# Patient Record
Sex: Male | Born: 1965 | Race: Black or African American | Hispanic: No | Marital: Married | Smoking: Never smoker
Health system: Southern US, Community
[De-identification: ages and names within clinical notes are randomized; demographics above are authoritative.]

## PROBLEM LIST (undated history)

## (undated) DIAGNOSIS — F419 Anxiety disorder, unspecified: Secondary | ICD-10-CM

## (undated) DIAGNOSIS — E785 Hyperlipidemia, unspecified: Secondary | ICD-10-CM

## (undated) DIAGNOSIS — I10 Essential (primary) hypertension: Secondary | ICD-10-CM

## (undated) DIAGNOSIS — T4145XA Adverse effect of unspecified anesthetic, initial encounter: Secondary | ICD-10-CM

## (undated) DIAGNOSIS — IMO0001 Reserved for inherently not codable concepts without codable children: Secondary | ICD-10-CM

## (undated) DIAGNOSIS — Z531 Procedure and treatment not carried out because of patient's decision for reasons of belief and group pressure: Secondary | ICD-10-CM

## (undated) DIAGNOSIS — M109 Gout, unspecified: Secondary | ICD-10-CM

## (undated) DIAGNOSIS — J45909 Unspecified asthma, uncomplicated: Secondary | ICD-10-CM

## (undated) DIAGNOSIS — M1990 Unspecified osteoarthritis, unspecified site: Secondary | ICD-10-CM

## (undated) DIAGNOSIS — T8859XA Other complications of anesthesia, initial encounter: Secondary | ICD-10-CM

## (undated) HISTORY — PX: COLONOSCOPY W/ POLYPECTOMY: SHX1380

## (undated) HISTORY — DX: Hyperlipidemia, unspecified: E78.5

## (undated) HISTORY — DX: Unspecified asthma, uncomplicated: J45.909

## (undated) HISTORY — DX: Essential (primary) hypertension: I10

## (undated) HISTORY — DX: Gout, unspecified: M10.9

---
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
## 1993-10-29 HISTORY — PX: INGUINAL HERNIA REPAIR: SUR1180

## 1998-06-21 ENCOUNTER — Emergency Department (HOSPITAL_COMMUNITY): Admission: EM | Admit: 1998-06-21 | Discharge: 1998-06-21 | Payer: Self-pay | Admitting: *Deleted

## 1998-07-21 ENCOUNTER — Emergency Department (HOSPITAL_COMMUNITY): Admission: EM | Admit: 1998-07-21 | Discharge: 1998-07-21 | Payer: Self-pay | Admitting: Emergency Medicine

## 1998-07-22 ENCOUNTER — Encounter: Payer: Self-pay | Admitting: Emergency Medicine

## 2000-09-23 ENCOUNTER — Encounter: Payer: Self-pay | Admitting: Emergency Medicine

## 2000-09-23 ENCOUNTER — Emergency Department (HOSPITAL_COMMUNITY): Admission: EM | Admit: 2000-09-23 | Discharge: 2000-09-23 | Payer: Self-pay | Admitting: Emergency Medicine

## 2007-06-02 ENCOUNTER — Emergency Department (HOSPITAL_COMMUNITY): Admission: EM | Admit: 2007-06-02 | Discharge: 2007-06-02 | Payer: Self-pay | Admitting: Emergency Medicine

## 2013-10-20 ENCOUNTER — Ambulatory Visit (INDEPENDENT_AMBULATORY_CARE_PROVIDER_SITE_OTHER): Payer: BC Managed Care – PPO | Admitting: Physician Assistant

## 2013-10-20 VITALS — BP 130/80 | HR 88 | Temp 98.5°F | Resp 16 | Ht >= 80 in | Wt 271.0 lb

## 2013-10-20 DIAGNOSIS — R51 Headache: Secondary | ICD-10-CM

## 2013-10-20 DIAGNOSIS — R509 Fever, unspecified: Secondary | ICD-10-CM

## 2013-10-20 DIAGNOSIS — F40298 Other specified phobia: Secondary | ICD-10-CM

## 2013-10-20 DIAGNOSIS — G039 Meningitis, unspecified: Secondary | ICD-10-CM

## 2013-10-20 DIAGNOSIS — H53149 Visual discomfort, unspecified: Secondary | ICD-10-CM

## 2013-10-20 LAB — POCT CBC
Granulocyte percent: 61.5 %G (ref 37–80)
HCT, POC: 48.2 % (ref 43.5–53.7)
Hemoglobin: 15.1 g/dL (ref 14.1–18.1)
Lymph, poc: 2.3 (ref 0.6–3.4)
MCH, POC: 28.5 pg (ref 27–31.2)
MCHC: 31.3 g/dL — AB (ref 31.8–35.4)
MCV: 91 fL (ref 80–97)
MID (cbc): 0.7 (ref 0–0.9)
MPV: 10.9 fL (ref 0–99.8)
POC Granulocyte: 4.9 (ref 2–6.9)
POC LYMPH PERCENT: 29.1 %L (ref 10–50)
POC MID %: 9.4 %M (ref 0–12)
Platelet Count, POC: 201 10*3/uL (ref 142–424)
RBC: 5.3 M/uL (ref 4.69–6.13)
RDW, POC: 14.5 %
WBC: 7.9 10*3/uL (ref 4.6–10.2)

## 2013-10-20 LAB — POCT INFLUENZA A/B
Influenza A, POC: NEGATIVE
Influenza B, POC: NEGATIVE

## 2013-10-20 NOTE — Patient Instructions (Signed)
I have advised him that he may have meningitis and that he must undergo further testing and evaluation at the emergency room. He has been advised to go to the [facility] emergency room. He understands the implications of this possible diagnosis. He agrees to go to the ER for further evaluation tonight without stopping elsewhere.   Viral Meningitis Meningitis is an inflammation of the fluid and membranes surrounding the spinal cord and brain. Viral meningitis is caused by a viral infection. It is important to know whether a virus or bacterium is causing your meningitis. Viral meningitis is generally less severe than bacterial meningitis. Aseptic meningitis is any meningitis not caused by a bacterial infection, including viral meningitis. Meningitis can also be caused by fungi, parasites, certain medicines, cancer, and autoimmune disorders. Uncomplicated meningitis usually resolves on its own in 7 to 10 days.However, there can be complicated cases that last much longer. People with lowered immune systems are at greater risk for poor outcomes from meningitis.It is important to get treatment as soon as possible to minimize the impact of a meningitis infection. Long-term complications could include seizures, hearing loss, weakness, paralysis, blindness, or cognitive impairment. CAUSES Most cases of meningitis are due to viruses. Viruses that cause meningitis include enteroviruses (such as polio and coxsackie viruses), herpes simplex virus, varicella zoster virus, mumps, and HIV. SYMPTOMS  Symptoms can develop over many hours. They may even take a few days to develop. Common symptoms of meningitis in people over the age of 2 years include:  High fever.  Headache.  Stiff neck.  Irritability.  Nausea and vomiting.  Fatigue.  Discomfort from exposure to light.  Discomfort from exposure to loud noise.  Trouble walking.  Altered mental status.  Seizures. DIAGNOSIS  Early diagnosis and  treatment are very important. If you have symptoms, you should see your caregiver right away. Your evaluation may include lab tests of your blood and spinal fluid. A spinal fluid sample is taken through a procedure called a lumbar puncture or spinal tap. During the procedure, a needle is inserted into an area in the lower back. You may also have a CT scan of your brain as part of your evaluation.If there is suspicion of an infection or inflammation of the brain (encephalitis), an MRI scan may be done. TREATMENT   Antibiotics are not effective against a virus. Antiviral medicines may be used depending on the cause of your meningitis.  Treatment for viral meningitis focuses on reducing your symptoms. This may include rest, hydration, and medicines to reduce fever and pain.  Steroids may also be used if there is significant swelling of the brain. PREVENTION  Vaccines can help prevent meningitis caused by polio, measles, and mumps.  Strict hand washing is effective in controlling the spread of many causes of meningitis.  Using barrier protection during sexual intercourse can prevent the spread of meningitis caused by viruses such as the herpes virus or HIV.  Protection from mosquitoes, especially for the very young and very old, can prevent some causes of meningitis. HOME CARE INSTRUCTIONS  Rest.  Drink enough water and fluids to keep your urine clear or pale yellow.  Take all medicine as prescribed.  Practice good hygiene to prevent others from getting sick.  Follow up with your caregiver as directed. SEEK IMMEDIATE MEDICAL CARE IF:  You develop dizziness.  You have a very fast heartbeat.  You have difficulty breathing.  You develop confusion.  You have seizures.  You have unstoppable nausea and vomiting.  You develop any worsening symptoms. MAKE SURE YOU:  Understand these instructions.  Will watch your condition.  Will get help right away if you are not doing well or  get worse. Document Released: 10/05/2002 Document Revised: 01/07/2012 Document Reviewed: 01/30/2011 [facility] Patient Information 2014 [place], [place].

## 2013-10-20 NOTE — Progress Notes (Signed)
Subjective:    Patient ID: Darrell King, male    DOB: 1966/08/13, 47 y.o.   MRN: [id number]  HPI 47 year old male with history of asthma presents with a 2 day history of "I am just sick." He complains of headache, fever, chills, fatigue, and myalgias. His headache is located along the occipital and bilateral temples. He complains of photophobia and phonophobia. There is some associated neck stiffness as he is unable to touch his chin to his chest. He has had a decreased appetite, but has been pushing fluids. He notes an increase in his urine output secondary to this, he otherwise denies polyuria. He denies any nasal congestion, rhinorrhea, sore throat, sinus pressure, otalgia, nausea, or vomiting. He denies knowing any sick contacts.   He also mentions noticing a bump along his left groin just prior to the above onset of symptoms. The bump is not sore itself, however he sore just distally.    Review of Systems  Constitutional: Positive for fever, chills, appetite change and fatigue.  HENT: Positive for postnasal drip. Negative for congestion, ear pain, hearing loss, rhinorrhea, sinus pressure, sneezing, sore throat, tinnitus and voice change.   Respiratory: Positive for cough. Negative for shortness of breath and wheezing.   Gastrointestinal: Negative for nausea, vomiting and diarrhea.  Endocrine: Positive for polyuria.  Skin: Positive for rash.  Neurological: Positive for headaches.    Past Medical History  Diagnosis Date  . Asthma    No Known Allergies  Prior to Admission medications   Not on File   History   Social History  . Marital Status: Married    Spouse Name: N/A    Number of Children: N/A  . Years of Education: N/A   Occupational History  . Not on file.   Social History Main Topics  . Smoking status: Never Smoker   . Smokeless tobacco: Not on file  . Alcohol Use: 0.5 oz/week    1 drink(s) per week  . Drug Use: No  . Sexual Activity: Not on file   Other  Topics Concern  . Not on file   Social History Narrative  . No narrative on file        Objective:   Physical Exam  Physical Exam: Filed Vitals:   10/20/13 1652  BP: 130/80  Pulse: 88  Temp: 98.5 F (36.9 C)  TempSrc: Oral  Resp: 16  Height: 6\' 9"  (2.057 m)  Weight: 271 lb (122.925 kg)  SpO2: 99%   Body mass index is 29.05 kg/(m^2).  General: Well developed, well nourished, in no acute distress. Head: Normocephalic, atraumatic, eyes without discharge, sclera non-icteric, nares are congested. Bilateral auditory canals clear, TM's are without perforation, pearly grey and translucent with reflective cone of light bilaterally. Oral cavity moist, posterior pharynx without exudate, erythema, peritonsillar abscess, or post nasal drip.  Neck: Supple. No thyromegaly. No lymphadenopathy. Positive nuchal rigidity. Lungs: Clear bilaterally to auscultation without wheezes, rales, or rhonchi. Breathing is unlabored. Heart: RRR with S1 S2. No murmurs, rubs, or gallops appreciated.  Msk:  Strength and tone normal for age. Extremities/Skin: Warm and dry. No clubbing or cyanosis. No edema. Follicular lesion left groin with inferior adenopathy. Neuro: Alert and oriented X 3. Moves all extremities spontaneously. Gait is normal. CNII-XII grossly in tact. Positive Kernig's and Brudzinski's signs.    Psych:  Responds to questions appropriately with a normal affect.   Labs: Results for orders placed in visit on 10/20/13  POCT CBC  Result Value Range   WBC 7.9  4.6 - 10.2 K/uL   Lymph, poc 2.3  0.6 - 3.4   POC LYMPH PERCENT 29.1  10 - 50 %L   MID (cbc) 0.7  0 - 0.9   POC MID % 9.4  0 - 12 %M   POC Granulocyte 4.9  2 - 6.9   Granulocyte percent 61.5  37 - 80 %G   RBC 5.30  4.69 - 6.13 M/uL   Hemoglobin 15.1  14.1 - 18.1 g/dL   HCT, POC 40.9  81.1 - 53.7 %   MCV 91.0  80 - 97 fL   MCH, POC 28.5  27 - 31.2 pg   MCHC 31.3 (*) 31.8 - 35.4 g/dL   RDW, POC 91.4     Platelet Count, POC 201   142 - 424 K/uL   MPV 10.9  0 - 99.8 fL  POCT INFLUENZA A/B      Result Value Range   Influenza A, POC Negative     Influenza B, POC Negative          Assessment & Plan:  47 year old male with headache, fever, chills, photophobia, phonophobia, and possible meningitis  -I am concerned that he has meningitis.  -I have advised him that he needs further evaluation at [facility] ED tonight including a lumbar puncture.  -He understands this and agrees to go to the [facility] ED straight from here for further evaluation  -Spoke with charge nurse at [facility] for patient hand off -Patient discussed with Dr. Clyda Hurdle, PA-C [facility] [address] [place], [place] [address] [phone]

## 2013-10-21 ENCOUNTER — Inpatient Hospital Stay (HOSPITAL_COMMUNITY)
Admission: EM | Admit: 2013-10-21 | Discharge: 2013-10-23 | DRG: 076 | Disposition: A | Discharge status: Home / Self Care | Payer: BC Managed Care – PPO | Attending: Internal Medicine | Admitting: Internal Medicine

## 2013-10-21 ENCOUNTER — Emergency Department (HOSPITAL_COMMUNITY): Payer: BC Managed Care – PPO

## 2013-10-21 ENCOUNTER — Other Ambulatory Visit: Payer: Self-pay | Admitting: Diagnostic Radiology

## 2013-10-21 ENCOUNTER — Encounter (HOSPITAL_COMMUNITY): Payer: Self-pay | Admitting: Emergency Medicine

## 2013-10-21 DIAGNOSIS — G039 Meningitis, unspecified: Secondary | ICD-10-CM

## 2013-10-21 DIAGNOSIS — J45909 Unspecified asthma, uncomplicated: Secondary | ICD-10-CM | POA: Diagnosis present

## 2013-10-21 DIAGNOSIS — J111 Influenza due to unidentified influenza virus with other respiratory manifestations: Secondary | ICD-10-CM

## 2013-10-21 DIAGNOSIS — R599 Enlarged lymph nodes, unspecified: Secondary | ICD-10-CM | POA: Diagnosis present

## 2013-10-21 DIAGNOSIS — R590 Localized enlarged lymph nodes: Secondary | ICD-10-CM

## 2013-10-21 DIAGNOSIS — G030 Nonpyogenic meningitis: Secondary | ICD-10-CM

## 2013-10-21 DIAGNOSIS — R509 Fever, unspecified: Secondary | ICD-10-CM

## 2013-10-21 DIAGNOSIS — A879 Viral meningitis, unspecified: Principal | ICD-10-CM | POA: Diagnosis present

## 2013-10-21 HISTORY — DX: Procedure and treatment not carried out because of patient's decision for reasons of belief and group pressure: Z53.1

## 2013-10-21 HISTORY — DX: Anxiety disorder, unspecified: F41.9

## 2013-10-21 HISTORY — DX: Reserved for inherently not codable concepts without codable children: IMO0001

## 2013-10-21 LAB — CREATININE, SERUM
Creatinine, Ser: 1.19 mg/dL (ref 0.50–1.35)
GFR calc Af Amer: 83 mL/min — ABNORMAL LOW (ref >90)

## 2013-10-21 LAB — CBC WITH DIFFERENTIAL/PLATELET
Basophils Absolute: 0 10*3/uL (ref 0.0–0.1)
Basophils Relative: 0 % (ref 0–1)
Eosinophils Relative: 3 % (ref 0–5)
HCT: 41.2 % (ref 39.0–52.0)
Hemoglobin: 14.3 g/dL (ref 13.0–17.0)
Lymphocytes Relative: 17 % (ref 12–46)
MCHC: 34.7 g/dL (ref 30.0–36.0)
MCV: 84.9 fL (ref 78.0–100.0)
Monocytes Absolute: 0.9 10*3/uL (ref 0.1–1.0)
Monocytes Relative: 9 % (ref 3–12)
Neutro Abs: 7.2 10*3/uL (ref 1.7–7.7)
Platelets: 197 10*3/uL (ref 150–400)
RDW: 13.4 % (ref 11.5–15.5)
WBC: 10.2 10*3/uL (ref 4.0–10.5)

## 2013-10-21 LAB — CSF CELL COUNT WITH DIFFERENTIAL
RBC Count, CSF: 1 /mm3 — ABNORMAL HIGH
RBC Count, CSF: 11 /mm3 — ABNORMAL HIGH
Tube #: 4
WBC, CSF: 5 /mm3 (ref 0–5)
WBC, CSF: 8 /mm3 — ABNORMAL HIGH (ref 0–5)

## 2013-10-21 LAB — GLUCOSE, CSF: Glucose, CSF: 62 mg/dL (ref 43–76)

## 2013-10-21 LAB — COMPREHENSIVE METABOLIC PANEL
AST: 23 U/L (ref 0–37)
Albumin: 3.5 g/dL (ref 3.5–5.2)
CO2: 24 mEq/L (ref 19–32)
Calcium: 9 mg/dL (ref 8.4–10.5)
Creatinine, Ser: 1.24 mg/dL (ref 0.50–1.35)
GFR calc non Af Amer: 68 mL/min — ABNORMAL LOW (ref >90)
Potassium: 3.7 mEq/L (ref 3.5–5.1)

## 2013-10-21 LAB — CBC
MCV: 85.1 fL (ref 78.0–100.0)
Platelets: 170 10*3/uL (ref 150–400)
RDW: 13.5 % (ref 11.5–15.5)
WBC: 8.4 10*3/uL (ref 4.0–10.5)

## 2013-10-21 LAB — GRAM STAIN: Special Requests: 7

## 2013-10-21 MED ORDER — SODIUM CHLORIDE 0.9 % IV BOLUS (SEPSIS)
1000.0000 mL | Freq: Once | INTRAVENOUS | Status: AC
  Administered 2013-10-21: 1000 mL via INTRAVENOUS

## 2013-10-21 MED ORDER — ONDANSETRON HCL 4 MG/2ML IJ SOLN: 4.0000 mg | Freq: Three times a day (TID) | INTRAMUSCULAR | Status: AC | PRN

## 2013-10-21 MED ORDER — SODIUM CHLORIDE 0.9 % IV SOLN
INTRAVENOUS | Status: DC
  Administered 2013-10-21: 17:00:00 via INTRAVENOUS

## 2013-10-21 MED ORDER — ACETAMINOPHEN 325 MG PO TABS
650.0000 mg | ORAL_TABLET | Freq: Once | ORAL | Status: AC
  Administered 2013-10-21: 650 mg via ORAL

## 2013-10-21 MED ORDER — ENOXAPARIN SODIUM 60 MG/0.6ML ~~LOC~~ SOLN
60.0000 mg | SUBCUTANEOUS | Status: DC
  Filled 2013-10-21: qty 0.6

## 2013-10-21 MED ORDER — VANCOMYCIN HCL 10 G IV SOLR
1250.0000 mg | Freq: Two times a day (BID) | INTRAVENOUS | Status: DC
  Administered 2013-10-22 – 2013-10-23 (×4): 1250 mg via INTRAVENOUS
  Filled 2013-10-21 (×5): qty 1250

## 2013-10-21 MED ORDER — SODIUM CHLORIDE 0.9 % IV SOLN
INTRAVENOUS | Status: DC
  Administered 2013-10-21 – 2013-10-22 (×3): via INTRAVENOUS

## 2013-10-21 MED ORDER — ONDANSETRON HCL 4 MG/2ML IJ SOLN
4.0000 mg | Freq: Once | INTRAMUSCULAR | Status: AC
  Administered 2013-10-21: 4 mg via INTRAVENOUS
  Filled 2013-10-21: qty 2

## 2013-10-21 MED ORDER — METOCLOPRAMIDE HCL 5 MG/ML IJ SOLN
10.0000 mg | Freq: Once | INTRAMUSCULAR | Status: AC
  Administered 2013-10-21: 10 mg via INTRAVENOUS
  Filled 2013-10-21: qty 2

## 2013-10-21 MED ORDER — DEXTROSE 5 % IV SOLN
2.0000 g | Freq: Two times a day (BID) | INTRAVENOUS | Status: DC
  Administered 2013-10-21 – 2013-10-23 (×4): 2 g via INTRAVENOUS
  Filled 2013-10-21 (×6): qty 2

## 2013-10-21 MED ORDER — HYDROMORPHONE HCL PF 1 MG/ML IJ SOLN: 1.0000 mg | INTRAMUSCULAR | Status: DC | PRN

## 2013-10-21 MED ORDER — VANCOMYCIN HCL 10 G IV SOLR
2500.0000 mg | Freq: Once | INTRAVENOUS | Status: AC
  Administered 2013-10-21: 2500 mg via INTRAVENOUS
  Filled 2013-10-21 (×2): qty 2500

## 2013-10-21 MED ORDER — HYDROMORPHONE HCL PF 1 MG/ML IJ SOLN
1.0000 mg | INTRAMUSCULAR | Status: AC | PRN
  Administered 2013-10-21 (×2): 1 mg via INTRAVENOUS
  Filled 2013-10-21 (×2): qty 1

## 2013-10-21 MED ORDER — MORPHINE SULFATE 4 MG/ML IJ SOLN
4.0000 mg | Freq: Once | INTRAMUSCULAR | Status: AC
  Administered 2013-10-21: 4 mg via INTRAVENOUS
  Filled 2013-10-21: qty 1

## 2013-10-21 MED ORDER — ACETAMINOPHEN 325 MG PO TABS: 1000.0000 mg | ORAL_TABLET | Freq: Four times a day (QID) | ORAL | Status: DC | PRN

## 2013-10-21 MED ORDER — DIPHENHYDRAMINE HCL 50 MG/ML IJ SOLN
25.0000 mg | Freq: Once | INTRAMUSCULAR | Status: AC
  Administered 2013-10-21: 25 mg via INTRAVENOUS
  Filled 2013-10-21: qty 1

## 2013-10-21 MED ORDER — ENOXAPARIN SODIUM 40 MG/0.4ML ~~LOC~~ SOLN
40.0000 mg | SUBCUTANEOUS | Status: DC
  Filled 2013-10-21: qty 0.4

## 2013-10-21 NOTE — H&P (Signed)
Date: 10/21/2013               Patient Name:  Darrell King MRN: [id number]  DOB: 10-01-1966 Age / Sex: 47 y.o., male   PCP: No Pcp Per Patient              Medical Service: Internal Medicine Teaching Service              Attending Physician: Dr. Farley Ly, MD    First Contact: Dr. Mariea Clonts  Pager: [phone]  Second Contact: Dr. Zada Girt Pager: [phone]            After Hours (After 5p/  First Contact Pager: [phone]  weekends / holidays): Second Contact Pager: [phone]   Chief Complaint: Fevers, and headache  History of Present Illness: This is a 47 year old gentleman without significant past medical history who presents with general malaise, fevers, headache, photophobia, neck pain for 5 days.  Patient reports severe headache (10/10) which has not improved with over-the-counter pain medications since onset on Saturday. He was evaluated in urgent care one day prior to admission, where he was advised to immediately present to the emergency department due to concern of meningitis but he was unable to come until today. He describes his headache to be located in the occipital area and temporal area bilaterally. His headache extends from the back of his head down to both shoulders. He reports that his fevers have been reaching 102, associated with chills and rigors; they have been intermittent. His appetite has been poor, but he denies nausea, vomiting, abdominal pain, or diarrhea. However, he reports that his mouth has been feeling dry. He denies chest pain, cough, wheezing, sore throat, nasal congestion, and a or post nasal drip. He does not take home medications except OTC Tylenol, and ibuprofen for this current headaches. Patient reports that he has 2 cats at and he denies any recent scratches. He has not been out camping and he does not spend any significant time in the woods or grass. 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 His last maintaince work was onFriday, which  is the day prior to onset of his headache. He is unsure whether any of his employers had flulike symptoms. 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    His only other complaint is a painful bump in the left groin which started a few days prior to onset of his headache and chills. He denies any wounds on his lower extremities. No history of trauma. He has a remote history of right inguinal hernia, which was operated several years ago.    Review of Systems: Respiratory: Denies SOB, DOE, cough, chest tightness, and wheezing.  Cardiovascular: Denies chest pain, palpitations and leg swelling.  Gastrointestinal: Denies nausea, vomiting, abdominal pain, diarrhea, constipation,blood in stool and abdominal distention.  Genitourinary: Denies dysuria, urgency, frequency  Musculoskeletal: Denies myalgias, joint swelling, arthralgias and gait problem. He reports back pain.  Skin: Denies pallor, rash and wound.  Neurological: Denies dizziness, seizures, syncope, or confusion Hematological: Easy bruising, personal or family bleeding history   Medications Prior to Admission  Medication Sig Dispense Refill  . acetaminophen (TYLENOL) 325 MG tablet Take 650 mg by mouth every 6 (six) hours as needed for mild pain.        Allergies: Allergies as of 10/21/2013  . (No Known Allergies)   Past Medical History  Diagnosis Date  . Asthma    Past Surgical History  Procedure Laterality  Date  . Hernia repair     Family History  Problem Relation Age of Onset  . Cancer Mother    History   Social History  . Marital Status: Married    Spouse Name: N/A    Number of Children: N/A  . Years of Education: N/A   Occupational History  . Not on file.   Social History Main Topics  . Smoking status: Never Smoker   . Smokeless tobacco: Not on file  . Alcohol Use: 0.5 oz/week    1 drink(s) per week  . Drug Use: No  . Sexual Activity: Not on file   Other Topics Concern  . Not on  file   Social History Narrative  . No narrative on file    Physical Exam: Filed Vitals:   10/21/13 1653  BP: 128/83  Pulse: 75  Temp: 99.4 F (37.4 C)  Resp: 20  Tmax 102.2  General: Patient appears miserable, but otherwise well developed, well nourished; no acute distressed, cooperative with exam, mildly dehydrated Head: atraumatic, normocephalic, reports pain on slight movement of his neck  Eye: pupils equal, round and reactive; sclera anicteric; normal conjunctiva  Nose/throat: oropharynx clear, moist mucous membranes, pink gums  Neck: without palpable nodules  Lungs/Chest wall: clear to auscultation bilaterally, normal work of breathing  Heart: normal rate and regular rhythm; no murmurs Pulses: radial and dorsalis pedis pulses are 2+ and symmetric  Abdomen: Normal fullness, no rebound, guarding, or rigidity; normal bowel sounds; no masses or organomegaly. Genitalia exam is normal. Abdominal orifices without evidence of hernia. Right inguinal surgical scar. Skin: warm, dry, intact, normal turgor, no rashes  Extremities: no peripheral edema, clubbing, or cyanosis. Left groin, without discernible discrete lymph node on palpation, however, he reports pain in the inguinal lymph node area.  No signs of inflammation or infection. Neurologic: A&O X3, CN II - XII are grossly intact. Motor strength is 5/5 in the all 4 extremities, Sensations intact to light touch.  Psych: patient is alert and oriented, mood and affect are normal and congruent, thought content is normal without delusions, thought process is linear, speech is normal and non-pressured, behavior is normal   Lab results: Basic Metabolic Panel:  Recent Labs  16/10/96 0244  NA 133*  K 3.7  CL 98  CO2 24  GLUCOSE 99  BUN 15  CREATININE 1.24  CALCIUM 9.0   Liver Function Tests:  Recent Labs  10/21/13 0244  AST 23  ALT 21  ALKPHOS 64  BILITOT 0.8  PROT 7.8  ALBUMIN 3.5   CBC:  Recent Labs  10/20/13 1826  10/21/13 0244  WBC 7.9 10.2  NEUTROABS  --  7.2  HGB 15.1 14.3  HCT 48.2 41.2  MCV 91.0 84.9  PLT  --  197    Imaging results:  Dg Chest 2 View  10/21/2013   CLINICAL DATA:  Cough and fever for 4 days.  EXAM: CHEST  2 VIEW  COMPARISON:  None.  FINDINGS: The lungs are well-aerated. Pulmonary vascularity is at the upper limits of normal. There is no evidence of focal opacification, pleural effusion or pneumothorax.  The heart is normal in size; the mediastinal contour is within normal limits. No acute osseous abnormalities are seen.  IMPRESSION: No acute cardiopulmonary process seen.   Electronically Signed   By: Roanna Raider M.D.   On: 10/21/2013 04:06   Ct Head Wo Contrast  10/21/2013   CLINICAL DATA:  Headache and nausea. Fever. Focal posterior neck tenderness.  EXAM: CT HEAD WITHOUT CONTRAST  CT CERVICAL SPINE WITHOUT CONTRAST  TECHNIQUE: Multidetector CT imaging of the head and cervical spine was performed following the standard protocol without intravenous contrast. Multiplanar CT image reconstructions of the cervical spine were also generated.  COMPARISON:  Cervical spine radiographs performed 06/02/2007  FINDINGS: CT HEAD FINDINGS  There is no evidence of acute infarction, mass lesion, or intra- or extra-axial hemorrhage on CT.  The posterior fossa, including the cerebellum, brainstem and fourth ventricle, is within normal limits. The third and lateral ventricles, and basal ganglia are unremarkable in appearance. The cerebral hemispheres are symmetric in appearance, with normal gray-white differentiation. No mass effect or midline shift is seen.  There is no evidence of fracture; visualized osseous structures are unremarkable in appearance. The orbits are within normal limits. The paranasal sinuses and mastoid air cells are well-aerated. No significant soft tissue abnormalities are seen.  CT CERVICAL SPINE FINDINGS  There is no evidence of fracture or subluxation. Vertebral bodies  demonstrate normal height and alignment. Intervertebral disc spaces are preserved. Prevertebral soft tissues are within normal limits. The visualized neural foramina are grossly unremarkable. There is no CT evidence to suggest discitis.  The thyroid gland is unremarkable in appearance. The visualized lung apices are clear. No significant soft tissue abnormalities are seen.  IMPRESSION: 1. Unremarkable noncontrast CT of the head. 2. No evidence of fracture or subluxation along the cervical spine. No CT evidence for discitis.   Electronically Signed   By: Roanna Raider M.D.   On: 10/21/2013 04:18   Ct Cervical Spine Wo Contrast  10/21/2013   CLINICAL DATA:  Headache and nausea. Fever. Focal posterior neck tenderness.  EXAM: CT HEAD WITHOUT CONTRAST  CT CERVICAL SPINE WITHOUT CONTRAST  TECHNIQUE: Multidetector CT imaging of the head and cervical spine was performed following the standard protocol without intravenous contrast. Multiplanar CT image reconstructions of the cervical spine were also generated.  COMPARISON:  Cervical spine radiographs performed 06/02/2007  FINDINGS: CT HEAD FINDINGS  There is no evidence of acute infarction, mass lesion, or intra- or extra-axial hemorrhage on CT.  The posterior fossa, including the cerebellum, brainstem and fourth ventricle, is within normal limits. The third and lateral ventricles, and basal ganglia are unremarkable in appearance. The cerebral hemispheres are symmetric in appearance, with normal gray-white differentiation. No mass effect or midline shift is seen.  There is no evidence of fracture; visualized osseous structures are unremarkable in appearance. The orbits are within normal limits. The paranasal sinuses and mastoid air cells are well-aerated. No significant soft tissue abnormalities are seen.  CT CERVICAL SPINE FINDINGS  There is no evidence of fracture or subluxation. Vertebral bodies demonstrate normal height and alignment. Intervertebral disc spaces are  preserved. Prevertebral soft tissues are within normal limits. The visualized neural foramina are grossly unremarkable. There is no CT evidence to suggest discitis.  The thyroid gland is unremarkable in appearance. The visualized lung apices are clear. No significant soft tissue abnormalities are seen.  IMPRESSION: 1. Unremarkable noncontrast CT of the head. 2. No evidence of fracture or subluxation along the cervical spine. No CT evidence for discitis.   Electronically Signed   By: Roanna Raider M.D.   On: 10/21/2013 04:18   Dg Lumbar Puncture Fluoro Guide  10/21/2013   CLINICAL DATA:  Severe headache and neck pain and fever.  EXAM: DIAGNOSTIC LUMBAR PUNCTURE UNDER FLUOROSCOPIC GUIDANCE  FLUOROSCOPY TIME:  0 min 14 seconds  PROCEDURE: Informed consent was obtained from the patient prior  to the procedure, including potential complications of headache, allergy, and pain. With the patient prone, the lower back was prepped with Betadine. 1% Lidocaine was used for local anesthesia. Lumbar puncture was performed at the L3-4 level using a gauge needle with return of clear colorless CSF with an opening pressure of 20 cm water. 28ml of CSF were obtained for laboratory studies. The patient tolerated the procedure well and there were no apparent complications. Closing pressure was 15 cm of water.  IMPRESSION: Lumbar puncture performed without difficulty or immediate complication.   Electronically Signed   By: Geanie Cooley M.D.   On: 10/21/2013 12:05    Other results: EKG: None  Assessment & Plan by Problem: Active Problems:   Aseptic meningitis    This is a 47 year old gentleman without significant past medical history who presents with general malaise, fevers, headache, photophobia, neck pain for 5 days.  # Aseptic Meningitis: [name] presentation initially concerning for meningitis given his symptoms of headache, and signs of meningeal irritation on physical exam. Symptoms have been present for several  days. He appears to be in significant pain due to headache. Head CT scan was normal. Results of the CSF exam reveals WBC = 8 in tube #1, which fell to 5 in tube # 4. Differential count not done due to low WBC count. Other results including CSF appearance, grain stain, glucose, and protein normal. Herpes simplex PCR, and CSF cultures are pending. POCT influenza A/B performed from the urgent care yesterday where negative. Possibility of bacterial meningitis based on CSF results is low, but still remains of concern. I discussed with Dr. Orvan Falconer of ID, who recommended empiric treatment with vancomycin, and ceftriaxone until we have CSF culture results.  Plan. - Admit to MedSurg under observation - Repeat flu panel and consider Tamiflu treatment if positive. - Blood cultures before abx. - Empirical abx with ceftriaxone and vancomycin per pharmacy as advised by Dr Orvan Falconer. - no IV steroids for now - Check HIV. - IV Dilaudid 1 mg q4hr prn  - IVF at 150 cc/hr for 20hrs, he received 3L in ED - Dr Orvan Falconer will see patient.  # Left Inguinal Lymphadenopathy: likely related to the systemic flu symptoms he has leading to reactive lymphadenitis. No evidence of wounds on his LE.    Dispo: Disposition is deferred at this time, awaiting improvement of current medical problems. Anticipated discharge in approximately 1-2 day(s).   The patient does not have a current PCP (No Pcp Per Patient), therefore will be require OPC follow-up after discharge.   The patient does not have transportation limitations that hinder transportation to clinic appointments.   Signed:  Dow Adolph, MD PGY-2 Internal Medicine Teaching Service Pager: [phone] (7pm-7am) 10/21/2013, 4:23 PM

## 2013-10-21 NOTE — ED Notes (Signed)
Headache for 3-4 days with nausea and he has a temp.  He was seen at [facility] eralier tonight and they told him he may have meningitis.  Last tylenol was 1800 today

## 2013-10-21 NOTE — Progress Notes (Signed)
Darrell King [id number] Code Status: FULL Admission Data: 10/21/2013 5:03 PM Attending Provider:  Meredith Pel PCP:No PCP Per Patient Consults/ Treatment Team: Internal Medicine  Darrell King is a 47 y.o. male patient admitted from ED awake, alert - oriented  X 4 - no acute distress noted.  VSS - Blood pressure 128/83, pulse 75, temperature 99.4 F (37.4 C), temperature source Oral, resp. rate 20, height 6\' 9"  (2.057 m), weight 122.925 kg (271 lb), SpO2 97.00%.  no c/o shortness of breath, no c/o chest pain.  O2:   n/a IV Fluids:  IV in place, occlusive dsg intact without redness, IV cath forearm right, condition patent and no redness normal saline.  Allergies:  No Known Allergies   Past Medical History  Diagnosis Date  . Asthma    Medications Prior to Admission  Medication Sig Dispense Refill  . acetaminophen (TYLENOL) 325 MG tablet Take 650 mg by mouth every 6 (six) hours as needed for mild pain.       History:  obtained from the patient. Tobacco/alcohol: denied social drinker  Orientation to room, and floor completed with information packet given to patient/family.  Patient declined safety video at this time.  Admission INP armband ID verified with patient/family, and in place.   SR up x 2, fall assessment complete, with patient and family able to verbalize understanding of risk associated with falls, and verbalized understanding to call nsg before up out of bed.  Call light within reach, patient able to voice, and demonstrate understanding.  Skin, clean-dry- intact without evidence of bruising, or skin tears.   No evidence of skin break down noted on exam.     Will cont to eval and treat per MD orders.   Aldean Ast, RN 10/21/2013 5:03 PM

## 2013-10-21 NOTE — ED Notes (Signed)
Pt returned from US

## 2013-10-21 NOTE — ED Provider Notes (Signed)
Patient left the changes shift to get LP results. He continues to have throbbing headache. His wife states he gets that pain medicine and 30 minutes later he is complaining of throbbing headache. He started getting headache with chills and fever 4 days ago. He also has pain and tightness in his neck. He has had photophobia. He was seen at [facility] urgent care and had a negative flu test. He was sent to the ER to rule out meningitis.  Patient is awake and alert. He appears to be uncomfortable. He sitting in the dark.  12:39 discuss the spinal fluid results with Dr. Amada Jupiter, neurology. He states he feels like this is a viral illness and possibly mild septic meningitis. He states he could be admitted for pain control but did not feel like he needed to be admitted to get culture results. If his admitted they can consult him as needed. He also felt patient could go home if he felt like he could oral pain medications.  I talked to the patient and his wife. They have elected to stay overnight to try to get better pain control.  13:37 [facility] states they are capped  13:43 Dr Zada Girt, admit to med-surg, observation, Dr Meredith Pel  Results for orders placed during the hospital encounter of 10/21/13  [facility] STAIN      Result Value Range   Specimen Description CSF     Special Requests 7.0 ML FLUID     Gram Stain       Value: CYTOSPIN SLIDE:     WBC PRESENT, PREDOMINANTLY MONONUCLEAR     NO ORGANISMS SEEN   Report Status 10/21/2013 FINAL    CBC WITH DIFFERENTIAL      Result Value Range   WBC 10.2  4.0 - 10.5 K/uL   RBC 4.85  4.22 - 5.81 MIL/uL   Hemoglobin 14.3  13.0 - 17.0 g/dL   HCT 45.4  09.8 - 11.9 %   MCV 84.9  78.0 - 100.0 fL   MCH 29.5  26.0 - 34.0 pg   MCHC 34.7  30.0 - 36.0 g/dL   RDW 14.7  82.9 - 56.2 %   Platelets 197  150 - 400 K/uL   Neutrophils Relative % 70  43 - 77 %   Neutro Abs 7.2  1.7 - 7.7 K/uL   Lymphocytes Relative 17  12 - 46 %   Lymphs Abs 1.8  0.7 - 4.0 K/uL   Monocytes  Relative 9  3 - 12 %   Monocytes Absolute 0.9  0.1 - 1.0 K/uL   Eosinophils Relative 3  0 - 5 %   Eosinophils Absolute 0.3  0.0 - 0.7 K/uL   Basophils Relative 0  0 - 1 %   Basophils Absolute 0.0  0.0 - 0.1 K/uL  COMPREHENSIVE METABOLIC PANEL      Result Value Range   Sodium 133 (*) 135 - 145 mEq/L   Potassium 3.7  3.5 - 5.1 mEq/L   Chloride 98  96 - 112 mEq/L   CO2 24  19 - 32 mEq/L   Glucose, Bld 99  70 - 99 mg/dL   BUN 15  6 - 23 mg/dL   Creatinine, Ser 1.30  0.50 - 1.35 mg/dL   Calcium 9.0  8.4 - 86.5 mg/dL   Total Protein 7.8  6.0 - 8.3 g/dL   Albumin 3.5  3.5 - 5.2 g/dL   AST 23  0 - 37 U/L   ALT 21  0 - 53  U/L   Alkaline Phosphatase 64  39 - 117 U/L   Total Bilirubin 0.8  0.3 - 1.2 mg/dL   GFR calc non Af Amer 68 (*) >90 mL/min   GFR calc Af Amer 79 (*) >90 mL/min  CSF CELL COUNT WITH DIFFERENTIAL      Result Value Range   Tube # 1     Color, CSF COLORLESS  COLORLESS   Appearance, CSF CLEAR  CLEAR   Supernatant NOT INDICATED     RBC Count, CSF 11 (*) 0 /cu mm   WBC, CSF 8 (*) 0 - 5 /cu mm   Lymphs, CSF FEW  40 - 80 %   Monocyte-Macrophage-Spinal Fluid RARE  15 - 45 %   Other Cells, CSF TOO FEW TO COUNT, SMEAR AVAILABLE FOR REVIEW    CSF CELL COUNT WITH DIFFERENTIAL      Result Value Range   Tube # 4     Color, CSF COLORLESS  COLORLESS   Appearance, CSF CLEAR  CLEAR   Supernatant NOT INDICATED     RBC Count, CSF 1 (*) 0 /cu mm   WBC, CSF 5  0 - 5 /cu mm   Lymphs, CSF FEW  40 - 80 %   Monocyte-Macrophage-Spinal Fluid RARE  15 - 45 %   Other Cells, CSF TOO FEW TO COUNT, SMEAR AVAILABLE FOR REVIEW    GLUCOSE, CSF      Result Value Range   Glucose, CSF 62  43 - 76 mg/dL  PROTEIN, CSF      Result Value Range   Total  Protein, CSF 38  15 - 45 mg/dL    Laboratory interpretation all normal except although his spinal fluid is overall normal there are a few extra white blood cells consistent with a very mild aseptic meningitis.   Dg Chest 2 View  10/21/2013    CLINICAL DATA:  Cough and fever for 4 days.  EXAM: CHEST  2 VIEW  COMPARISON:  None.  FINDINGS: The lungs are well-aerated. Pulmonary vascularity is at the upper limits of normal. There is no evidence of focal opacification, pleural effusion or pneumothorax.  The heart is normal in size; the mediastinal contour is within normal limits. No acute osseous abnormalities are seen.  IMPRESSION: No acute cardiopulmonary process seen.   Electronically Signed   By: Roanna Raider M.D.   On: 10/21/2013 04:06   Dg Lumbar Puncture Fluoro Guide  10/21/2013   CLINICAL DATA:  Severe headache and neck pain and fever.  EXAM: DIAGNOSTIC LUMBAR PUNCTURE UNDER FLUOROSCOPIC GUIDANCE  FLUOROSCOPY TIME:  0 min 14 seconds  PROCEDURE: Informed consent was obtained from the patient prior to the procedure, including potential complications of headache, allergy, and pain. With the patient prone, the lower back was prepped with Betadine. 1% Lidocaine was used for local anesthesia. Lumbar puncture was performed at the L3-4 level using a gauge needle with return of clear colorless CSF with an opening pressure of 20 cm water. 28ml of CSF were obtained for laboratory studies. The patient tolerated the procedure well and there were no apparent complications. Closing pressure was 15 cm of water.  IMPRESSION: Lumbar puncture performed without difficulty or immediate complication.   Electronically Signed   By: Geanie Cooley M.D.   On: 10/21/2013 12:05    Diagnoses that have been ruled out:  None  Diagnoses that are still under consideration:  None  Final diagnoses:  Aseptic meningitis  Influenza-like illness     Plan  admission   Devoria Albe, MD, Franz Dell, MD 10/21/13 [phone]

## 2013-10-21 NOTE — Progress Notes (Signed)
ANTIBIOTIC CONSULT NOTE - INITIAL  Pharmacy Consult for Vancomycin and Ceftriaxone Indication: Possible meningitis  No Known Allergies  Patient Measurements: Height: 6\' 9"  (205.7 cm) Weight: 271 lb (122.925 kg) IBW/kg (Calculated) : 98.3 Adjusted Body Weight: 108.4 kg  Vital Signs: Temp: 99.4 F (37.4 C) (12/24 1653) Temp src: Oral (12/24 1653) BP: 128/83 mmHg (12/24 1653) Pulse Rate: 75 (12/24 1653) Intake/Output from previous day:   Intake/Output from this shift:    Labs:  Recent Labs  10/20/13 1826 10/21/13 0244  WBC 7.9 10.2  HGB 15.1 14.3  PLT  --  197  CREATININE  --  1.24   Estimated Creatinine Clearance: 113.8 ml/min (by C-G formula based on Cr of 1.24). No results found for this basename: VANCOTROUGH, VANCOPEAK, VANCORANDOM, GENTTROUGH, GENTPEAK, GENTRANDOM, TOBRATROUGH, TOBRAPEAK, TOBRARND, AMIKACINPEAK, AMIKACINTROU, AMIKACIN,  in the last 72 hours   Microbiology: Recent Results (from the past 720 hour(s))  GRAM STAIN     Status: None   Collection Time    10/21/13  9:10 AM      Result Value Range Status   Specimen Description CSF   Final   Special Requests 7.0 ML FLUID   Final   Gram Stain     Final   Value: CYTOSPIN SLIDE:     WBC PRESENT, PREDOMINANTLY MONONUCLEAR     NO ORGANISMS SEEN   Report Status 10/21/2013 FINAL   Final    Medical History: Past Medical History  Diagnosis Date  . Asthma   . Refusal of blood transfusions as patient is Jehovah's Witness   . Anxiety     "had taken Paxil; back in 1996; nothing since" (10/21/2013)    Medications:  Prescriptions prior to admission  Medication Sig Dispense Refill  . acetaminophen (TYLENOL) 325 MG tablet Take 650 mg by mouth every 6 (six) hours as needed for mild pain.       Scheduled:  . enoxaparin (LOVENOX) injection  40 mg Subcutaneous Q24H   Assessment: 47 yo M with headache, fever, chills, fatigue, myalgias, some neck stiffness.   Afeb, WBC wnl (10.2)  12/24  Ceftriaxone>> 12/24 Vanc>>  12/24 CSF>>WBC present 12/24 BCx2>>  Goal of Therapy:  Vancomycin trough level 15-20 mcg/ml  Plan:  - Ceftriaxone 2 gm IV q12h - Vanc 2500 mg x 1, then 1250 mg IV q12h - Monitor temp, WBC, renal function, levels prn   Margie Billet, PharmD Clinical Pharmacist - Resident Pager: [phone] Pharmacy: [phone] 10/21/2013 6:23 PM

## 2013-10-21 NOTE — ED Provider Notes (Signed)
CSN: [id number]     Arrival date & time 10/21/13  0129 History   First MD Initiated Contact with Patient 10/21/13 0214     Chief Complaint  Patient presents with  . Headache   (Consider location/radiation/quality/duration/timing/severity/associated sxs/prior Treatment) HPI Patient presents from urgent care for headache for 2-3 days along with myalgias, fever and now neck pain. Provider in the urgent care was concern for meningitis. Patient states he has posterior neck pain worse with movement and palpation. He's had a mild cough with little sputum production. Denies any nausea, vomiting or diarrhea. He's had no chest pain or abdominal pain. He has no focal weakness or numbness. Describes his headache as throbbing and bitemporal area radiating to the occiput. He is having photophobia and phonophobia. Headache as gradual onset. Past Medical History  Diagnosis Date  . Asthma    Past Surgical History  Procedure Laterality Date  . Hernia repair     Family History  Problem Relation Age of Onset  . Cancer Mother    History  Substance Use Topics  . Smoking status: Never Smoker   . Smokeless tobacco: Not on file  . Alcohol Use: 0.5 oz/week    1 drink(s) per week    Review of Systems  Constitutional: Positive for fever, chills and fatigue.  HENT: Negative for sore throat.   Eyes: Positive for photophobia. Negative for visual disturbance.  Respiratory: Positive for cough. Negative for shortness of breath and wheezing.   Gastrointestinal: Negative for nausea, vomiting, abdominal pain, diarrhea and constipation.  Musculoskeletal: Positive for back pain, neck pain and neck stiffness. Negative for joint swelling and myalgias.  Skin: Negative for rash and wound.  Neurological: Positive for headaches. Negative for dizziness, syncope, weakness, light-headedness and numbness.  All other systems reviewed and are negative.    Allergies  Review of patient's allergies indicates no known  allergies.  Home Medications   Current Outpatient Rx  Name  Route  Sig  Dispense  Refill  . acetaminophen (TYLENOL) 325 MG tablet   Oral   Take 650 mg by mouth every 6 (six) hours as needed for mild pain.          BP 130/76  Pulse 87  Temp(Src) 100.2 F (37.9 C) (Oral)  Resp 17  SpO2 99% Physical Exam  Nursing note and vitals reviewed. Constitutional: He is oriented to person, place, and time. He appears well-developed and well-nourished. No distress.  Mildly ill-appearing  HENT:  Head: Normocephalic and atraumatic.  Mouth/Throat: Oropharynx is clear and moist. No oropharyngeal exudate.  Eyes: EOM are normal. Pupils are equal, round, and reactive to light.  Neck: Normal range of motion. Neck supple. No thyromegaly present.  Mild restricted movement but no definite meningismus. Tender to palpation over the posterior midline.  Cardiovascular: Normal rate and regular rhythm.   Pulmonary/Chest: Effort normal and breath sounds normal. No respiratory distress. He has no wheezes. He has no rales. He exhibits no tenderness.  Abdominal: Soft. Bowel sounds are normal. He exhibits no distension and no mass. There is no tenderness. There is no rebound and no guarding.  Musculoskeletal: Normal range of motion. He exhibits no edema and no tenderness.  No calf swelling or tenderness.  Lymphadenopathy:    He has no cervical adenopathy.  Neurological: He is alert and oriented to person, place, and time.  Patient is alert and oriented x3 with clear, goal oriented speech. Patient has 5/5 motor in all extremities. Sensation is intact to light touch. Patient has  a normal gait and walks without assistance.   Skin: Skin is warm and dry. No rash noted. No erythema.  Psychiatric: He has a normal mood and affect. His behavior is normal.    ED Course  LUMBAR PUNCTURE Date/Time: 10/21/2013 7:29 AM Performed by: Loren Racer Authorized by: Ranae Palms, Aaren Atallah Consent: written consent  obtained. Risks and benefits: risks, benefits and alternatives were discussed Consent given by: patient Patient identity confirmed: verbally with patient Time out: Immediately prior to procedure a "time out" was called to verify the correct patient, procedure, equipment, support staff and site/side marked as required. Indications: evaluation for infection Local anesthetic: lidocaine 1% without epinephrine Anesthetic total: 2 ml Patient sedated: no Preparation: Patient was prepped and draped in the usual sterile fashion. Lumbar space: L3-L4 interspace [name] position: sitting Needle gauge: 20 Needle type: spinal needle - Quincke tip Number of attempts: 3 Post-procedure: pressure dressing applied Patient tolerance: Patient tolerated the procedure well with no immediate complications. Comments: Unsuccessful attempt. Pt tolerated well   (including critical care time) Labs Review Labs Reviewed  COMPREHENSIVE METABOLIC PANEL - Abnormal; Notable for the following:    Sodium 133 (*)    GFR calc non Af Amer 68 (*)    GFR calc Af Amer 79 (*)    All other components within normal limits  CBC WITH DIFFERENTIAL   Imaging Review Dg Chest 2 View  10/21/2013   CLINICAL DATA:  Cough and fever for 4 days.  EXAM: CHEST  2 VIEW  COMPARISON:  None.  FINDINGS: The lungs are well-aerated. Pulmonary vascularity is at the upper limits of normal. There is no evidence of focal opacification, pleural effusion or pneumothorax.  The heart is normal in size; the mediastinal contour is within normal limits. No acute osseous abnormalities are seen.  IMPRESSION: No acute cardiopulmonary process seen.   Electronically Signed   By: Roanna Raider M.D.   On: 10/21/2013 04:06   Ct Head Wo Contrast  10/21/2013   CLINICAL DATA:  Headache and nausea. Fever. Focal posterior neck tenderness.  EXAM: CT HEAD WITHOUT CONTRAST  CT CERVICAL SPINE WITHOUT CONTRAST  TECHNIQUE: Multidetector CT imaging of the head and cervical  spine was performed following the standard protocol without intravenous contrast. Multiplanar CT image reconstructions of the cervical spine were also generated.  COMPARISON:  Cervical spine radiographs performed 06/02/2007  FINDINGS: CT HEAD FINDINGS  There is no evidence of acute infarction, mass lesion, or intra- or extra-axial hemorrhage on CT.  The posterior fossa, including the cerebellum, brainstem and fourth ventricle, is within normal limits. The third and lateral ventricles, and basal ganglia are unremarkable in appearance. The cerebral hemispheres are symmetric in appearance, with normal gray-white differentiation. No mass effect or midline shift is seen.  There is no evidence of fracture; visualized osseous structures are unremarkable in appearance. The orbits are within normal limits. The paranasal sinuses and mastoid air cells are well-aerated. No significant soft tissue abnormalities are seen.  CT CERVICAL SPINE FINDINGS  There is no evidence of fracture or subluxation. Vertebral bodies demonstrate normal height and alignment. Intervertebral disc spaces are preserved. Prevertebral soft tissues are within normal limits. The visualized neural foramina are grossly unremarkable. There is no CT evidence to suggest discitis.  The thyroid gland is unremarkable in appearance. The visualized lung apices are clear. No significant soft tissue abnormalities are seen.  IMPRESSION: 1. Unremarkable noncontrast CT of the head. 2. No evidence of fracture or subluxation along the cervical spine. No CT evidence for  discitis.   Electronically Signed   By: Roanna Raider M.D.   On: 10/21/2013 04:18   Ct Cervical Spine Wo Contrast  10/21/2013   CLINICAL DATA:  Headache and nausea. Fever. Focal posterior neck tenderness.  EXAM: CT HEAD WITHOUT CONTRAST  CT CERVICAL SPINE WITHOUT CONTRAST  TECHNIQUE: Multidetector CT imaging of the head and cervical spine was performed following the standard protocol without intravenous  contrast. Multiplanar CT image reconstructions of the cervical spine were also generated.  COMPARISON:  Cervical spine radiographs performed 06/02/2007  FINDINGS: CT HEAD FINDINGS  There is no evidence of acute infarction, mass lesion, or intra- or extra-axial hemorrhage on CT.  The posterior fossa, including the cerebellum, brainstem and fourth ventricle, is within normal limits. The third and lateral ventricles, and basal ganglia are unremarkable in appearance. The cerebral hemispheres are symmetric in appearance, with normal gray-white differentiation. No mass effect or midline shift is seen.  There is no evidence of fracture; visualized osseous structures are unremarkable in appearance. The orbits are within normal limits. The paranasal sinuses and mastoid air cells are well-aerated. No significant soft tissue abnormalities are seen.  CT CERVICAL SPINE FINDINGS  There is no evidence of fracture or subluxation. Vertebral bodies demonstrate normal height and alignment. Intervertebral disc spaces are preserved. Prevertebral soft tissues are within normal limits. The visualized neural foramina are grossly unremarkable. There is no CT evidence to suggest discitis.  The thyroid gland is unremarkable in appearance. The visualized lung apices are clear. No significant soft tissue abnormalities are seen.  IMPRESSION: 1. Unremarkable noncontrast CT of the head. 2. No evidence of fracture or subluxation along the cervical spine. No CT evidence for discitis.   Electronically Signed   By: Roanna Raider M.D.   On: 10/21/2013 04:18    EKG Interpretation   None       MDM   Attempted LP in the emergency department was unsuccessful. Patient tolerated well. I discussed with radiology and will do the LP under fluoroscopy guidance. Patient signed out to oncoming emergency physician pending results of the LP.   Loren Racer, MD 10/23/13 [phone]

## 2013-10-21 NOTE — Progress Notes (Signed)
1541: Attempted to received report to admission for 5W36  3:46 PM Report received from [place], [place].

## 2013-10-21 NOTE — ED Notes (Signed)
Admitting at bedside 

## 2013-10-21 NOTE — ED Notes (Signed)
Patient transported to Ultrasound 

## 2013-10-22 ENCOUNTER — Observation Stay (HOSPITAL_COMMUNITY): Payer: BC Managed Care – PPO

## 2013-10-22 DIAGNOSIS — R839 Unspecified abnormal finding in cerebrospinal fluid: Secondary | ICD-10-CM

## 2013-10-22 DIAGNOSIS — R51 Headache: Secondary | ICD-10-CM

## 2013-10-22 DIAGNOSIS — R229 Localized swelling, mass and lump, unspecified: Secondary | ICD-10-CM

## 2013-10-22 DIAGNOSIS — R590 Localized enlarged lymph nodes: Secondary | ICD-10-CM | POA: Diagnosis present

## 2013-10-22 DIAGNOSIS — R509 Fever, unspecified: Secondary | ICD-10-CM | POA: Diagnosis present

## 2013-10-22 LAB — CBC
HCT: 39.1 % (ref 39.0–52.0)
Hemoglobin: 13.1 g/dL (ref 13.0–17.0)
MCH: 28.7 pg (ref 26.0–34.0)
MCHC: 33.5 g/dL (ref 30.0–36.0)
MCV: 85.7 fL (ref 78.0–100.0)
RBC: 4.56 MIL/uL (ref 4.22–5.81)

## 2013-10-22 LAB — COMPREHENSIVE METABOLIC PANEL
AST: 13 U/L (ref 0–37)
Albumin: 3.1 g/dL — ABNORMAL LOW (ref 3.5–5.2)
Alkaline Phosphatase: 56 U/L (ref 39–117)
CO2: 27 mEq/L (ref 19–32)
Calcium: 8.4 mg/dL (ref 8.4–10.5)
Creatinine, Ser: 1.43 mg/dL — ABNORMAL HIGH (ref 0.50–1.35)
GFR calc Af Amer: 67 mL/min — ABNORMAL LOW (ref >90)
GFR calc non Af Amer: 57 mL/min — ABNORMAL LOW (ref >90)
Glucose, Bld: 86 mg/dL (ref 70–99)
Total Protein: 7.2 g/dL (ref 6.0–8.3)

## 2013-10-22 LAB — HIV ANTIBODY (ROUTINE TESTING W REFLEX): HIV: NONREACTIVE

## 2013-10-22 LAB — INFLUENZA PANEL BY PCR (TYPE A & B)
Influenza A By PCR: NEGATIVE
Influenza B By PCR: NEGATIVE

## 2013-10-22 LAB — TROPONIN I: Troponin I: <0.3 ng/mL (ref <0.30)

## 2013-10-22 MED ORDER — HYDROMORPHONE HCL PF 1 MG/ML IJ SOLN
1.0000 mg | INTRAMUSCULAR | Status: DC | PRN
  Administered 2013-10-22 – 2013-10-23 (×6): 1 mg via INTRAVENOUS
  Filled 2013-10-22 (×7): qty 1

## 2013-10-22 MED ORDER — HYDROMORPHONE HCL PF 1 MG/ML IJ SOLN
1.0000 mg | INTRAMUSCULAR | Status: DC | PRN
  Administered 2013-10-22 (×2): 1 mg via INTRAVENOUS
  Filled 2013-10-22 (×2): qty 1

## 2013-10-22 NOTE — Progress Notes (Signed)
Subjective:    Interval Events:  Feeling better, afebrile and headaches well controlled. Overnight chest pain resolved.    Objective:    Vital Signs:   Temp:  [99 F (37.2 C)-99.5 F (37.5 C)] 99 F (37.2 C) (12/25 0430) Pulse Rate:  [66-109] 75 (12/25 0430) Resp:  [18-26] 18 (12/25 0430) BP: (110-137)/(51-84) 125/84 mmHg (12/25 0430) SpO2:  [92 %-99 %] 99 % (12/25 0430) Weight:  [271 lb (122.925 kg)] 271 lb (122.925 kg) (12/24 1653) Last BM Date: 10/19/13   Weights: 24-hour Weight change:   Filed Weights   10/21/13 1653  Weight: 271 lb (122.925 kg)     Intake/Output:   Intake/Output Summary (Last 24 hours) at 10/22/13 1505 Last data filed at 10/22/13 1300  Gross per 24 hour  Intake   3784 ml  Output      0 ml  Net   3784 ml       Physical Exam: General: Not visibly in pain. Making jokes. Empty BF tray. Well-developed, well-nourished, in no acute distress; alert, appropriate and cooperative throughout examination.  Lungs:  Normal respiratory effort. Clear to auscultation BL without crackles or wheezes.  Heart: RRR. S1 and S2 normal without gallop, murmur, or rubs.  Abdomen:  BS normoactive. Soft, Nondistended, non-tender.  No masses or organomegaly.  Neuro Neck is now supple. kernig's sign not repeated today. No other obvious craniopathies  Extremities: No pretibial edema. Still have tenderness in the left groin with signs of infections or inflammation.      Labs: Basic Metabolic Panel:  Recent Labs Lab 10/21/13 0244 10/21/13 1840 10/22/13 0419  NA 133*  --  137  K 3.7  --  4.0  CL 98  --  102  CO2 24  --  27  GLUCOSE 99  --  86  BUN 15  --  8  CREATININE 1.24 1.19 1.43*  CALCIUM 9.0  --  8.4    Liver Function Tests:  Recent Labs Lab 10/21/13 0244 10/22/13 0419  AST 23 13  ALT 21 14  ALKPHOS 64 56  BILITOT 0.8 0.8  PROT 7.8 7.2  ALBUMIN 3.5 3.1*    CBC:  Recent Labs Lab 10/21/13 0244 10/21/13 1840 10/22/13 0419  WBC 10.2  8.4 8.4  NEUTROABS 7.2  --   --   HGB 14.3 13.3 13.1  HCT 41.2 38.7* 39.1  MCV 84.9 85.1 85.7  PLT 197 170 180    Cardiac Enzymes:  Recent Labs Lab 10/22/13 0725  TROPONINI <0.30    BNP: No components found with this basename: POCBNP,   CBG: No results found for this basename: GLUCAP,  in the last 168 hours  Coagulation Studies: No results found for this basename: LABPROT, INR,  in the last 72 hours  Microbiology: Results for orders placed during the hospital encounter of 10/21/13  CSF CULTURE     Status: None   Collection Time    10/21/13  9:10 AM      Result Value Range Status   Specimen Description CSF   Final   Special Requests 7.0ML FLUID   Final   Gram Stain     Final   Value: WBC PRESENT, PREDOMINANTLY MONONUCLEAR     NO ORGANISMS SEEN     CYTOSPIN Performed at [facility]     Performed at [facility]   Culture     Final   Value: NO GROWTH 1 DAY     Performed at [facility]  Report Status PENDING   Incomplete  GRAM STAIN     Status: None   Collection Time    10/21/13  9:10 AM      Result Value Range Status   Specimen Description CSF   Final   Special Requests 7.0 ML FLUID   Final   Gram Stain     Final   Value: CYTOSPIN SLIDE:     WBC PRESENT, PREDOMINANTLY MONONUCLEAR     NO ORGANISMS SEEN   Report Status 10/21/2013 FINAL   Final  CULTURE, BLOOD (ROUTINE X 2)     Status: None   Collection Time    10/21/13  6:40 PM      Result Value Range Status   Specimen Description BLOOD LEFT ARM   Final   Special Requests BOTTLES DRAWN AEROBIC ONLY 5CC   Final   Culture  Setup Time     Final   Value: 10/21/2013 20:54     Performed at [facility]   Culture     Final   Value:        BLOOD CULTURE RECEIVED NO GROWTH TO DATE CULTURE WILL BE HELD FOR 5 DAYS BEFORE ISSUING A FINAL NEGATIVE REPORT     Performed at [facility]   Report Status PENDING   Incomplete  CULTURE, BLOOD (ROUTINE X 2)     Status: None    Collection Time    10/21/13  6:45 PM      Result Value Range Status   Specimen Description BLOOD LEFT HAND   Final   Special Requests BOTTLES DRAWN AEROBIC AND ANAEROBIC 5CC   Final   Culture  Setup Time     Final   Value: 10/21/2013 20:53     Performed at [facility]   Culture     Final   Value:        BLOOD CULTURE RECEIVED NO GROWTH TO DATE CULTURE WILL BE HELD FOR 5 DAYS BEFORE ISSUING A FINAL NEGATIVE REPORT     Performed at [facility]   Report Status PENDING   Incomplete     Imaging: Dg Chest 2 View  10/21/2013   CLINICAL DATA:  Cough and fever for 4 days.  EXAM: CHEST  2 VIEW  COMPARISON:  None.  FINDINGS: The lungs are well-aerated. Pulmonary vascularity is at the upper limits of normal. There is no evidence of focal opacification, pleural effusion or pneumothorax.  The heart is normal in size; the mediastinal contour is within normal limits. No acute osseous abnormalities are seen.  IMPRESSION: No acute cardiopulmonary process seen.   Electronically Signed   By: Roanna Raider M.D.   On: 10/21/2013 04:06   Ct Head Wo Contrast  10/21/2013   CLINICAL DATA:  Headache and nausea. Fever. Focal posterior neck tenderness.  EXAM: CT HEAD WITHOUT CONTRAST  CT CERVICAL SPINE WITHOUT CONTRAST  TECHNIQUE: Multidetector CT imaging of the head and cervical spine was performed following the standard protocol without intravenous contrast. Multiplanar CT image reconstructions of the cervical spine were also generated.  COMPARISON:  Cervical spine radiographs performed 06/02/2007  FINDINGS: CT HEAD FINDINGS  There is no evidence of acute infarction, mass lesion, or intra- or extra-axial hemorrhage on CT.  The posterior fossa, including the cerebellum, brainstem and fourth ventricle, is within normal limits. The third and lateral ventricles, and basal ganglia are unremarkable in appearance. The cerebral hemispheres are symmetric in appearance, with normal gray-white differentiation. No  mass effect or midline  shift is seen.  There is no evidence of fracture; visualized osseous structures are unremarkable in appearance. The orbits are within normal limits. The paranasal sinuses and mastoid air cells are well-aerated. No significant soft tissue abnormalities are seen.  CT CERVICAL SPINE FINDINGS  There is no evidence of fracture or subluxation. Vertebral bodies demonstrate normal height and alignment. Intervertebral disc spaces are preserved. Prevertebral soft tissues are within normal limits. The visualized neural foramina are grossly unremarkable. There is no CT evidence to suggest discitis.  The thyroid gland is unremarkable in appearance. The visualized lung apices are clear. No significant soft tissue abnormalities are seen.  IMPRESSION: 1. Unremarkable noncontrast CT of the head. 2. No evidence of fracture or subluxation along the cervical spine. No CT evidence for discitis.   Electronically Signed   By: Roanna Raider M.D.   On: 10/21/2013 04:18   Ct Cervical Spine Wo Contrast  10/21/2013   CLINICAL DATA:  Headache and nausea. Fever. Focal posterior neck tenderness.  EXAM: CT HEAD WITHOUT CONTRAST  CT CERVICAL SPINE WITHOUT CONTRAST  TECHNIQUE: Multidetector CT imaging of the head and cervical spine was performed following the standard protocol without intravenous contrast. Multiplanar CT image reconstructions of the cervical spine were also generated.  COMPARISON:  Cervical spine radiographs performed 06/02/2007  FINDINGS: CT HEAD FINDINGS  There is no evidence of acute infarction, mass lesion, or intra- or extra-axial hemorrhage on CT.  The posterior fossa, including the cerebellum, brainstem and fourth ventricle, is within normal limits. The third and lateral ventricles, and basal ganglia are unremarkable in appearance. The cerebral hemispheres are symmetric in appearance, with normal gray-white differentiation. No mass effect or midline shift is seen.  There is no evidence of fracture;  visualized osseous structures are unremarkable in appearance. The orbits are within normal limits. The paranasal sinuses and mastoid air cells are well-aerated. No significant soft tissue abnormalities are seen.  CT CERVICAL SPINE FINDINGS  There is no evidence of fracture or subluxation. Vertebral bodies demonstrate normal height and alignment. Intervertebral disc spaces are preserved. Prevertebral soft tissues are within normal limits. The visualized neural foramina are grossly unremarkable. There is no CT evidence to suggest discitis.  The thyroid gland is unremarkable in appearance. The visualized lung apices are clear. No significant soft tissue abnormalities are seen.  IMPRESSION: 1. Unremarkable noncontrast CT of the head. 2. No evidence of fracture or subluxation along the cervical spine. No CT evidence for discitis.   Electronically Signed   By: Roanna Raider M.D.   On: 10/21/2013 04:18   Dg Chest Port 1 View  10/22/2013   CLINICAL DATA:  Short of breath.  EXAM: PORTABLE CHEST - 1 VIEW  COMPARISON:  10/21/2013  FINDINGS: The heart size and mediastinal contours are within normal limits. Both lungs are clear. The visualized skeletal structures are unremarkable.  IMPRESSION: No active disease.   Electronically Signed   By: Amie Portland M.D.   On: 10/22/2013 08:35   Dg Lumbar Puncture Fluoro Guide  10/21/2013   CLINICAL DATA:  Severe headache and neck pain and fever.  EXAM: DIAGNOSTIC LUMBAR PUNCTURE UNDER FLUOROSCOPIC GUIDANCE  FLUOROSCOPY TIME:  0 min 14 seconds  PROCEDURE: Informed consent was obtained from the patient prior to the procedure, including potential complications of headache, allergy, and pain. With the patient prone, the lower back was prepped with Betadine. 1% Lidocaine was used for local anesthesia. Lumbar puncture was performed at the L3-4 level using a gauge needle with return of clear  colorless CSF with an opening pressure of 20 cm water. 28ml of CSF were obtained for laboratory  studies. The patient tolerated the procedure well and there were no apparent complications. Closing pressure was 15 cm of water.  IMPRESSION: Lumbar puncture performed without difficulty or immediate complication.   Electronically Signed   By: Geanie Cooley M.D.   On: 10/21/2013 12:05      Medications:    Infusions: . sodium chloride 150 mL/hr at 10/22/13 1226     Scheduled Medications: . cefTRIAXone (ROCEPHIN)  IV  2 g Intravenous Q12H  . vancomycin  1,250 mg Intravenous Q12H     PRN Medications: HYDROmorphone (DILAUDID) injection   Assessment/ Plan:   This is a 47 year old gentleman without significant past medical history who presents with general malaise, fevers, headache, photophobia, neck pain for 5 days.   # Aseptic Meningitis versus Bacterial meningitis: [name] presentation initially concerning for meningitis given his symptoms of headache, and signs of meningeal irritation on physical exam as was severe headache and admitted for pain management and empirical antibiotics. Temp max was 102.79F. Head CT scan was normal. Results of the CSF exam revealed WBC = 8 in tube #1, which fell to 5 in tube # 4. Differential count not done due to low WBC count. Other results including CSF appearance, grain stain, glucose, and protein normal. Herpes simplex PCR, and CSF cultures are pending. POCT influenza A/B performed from the urgent care negative.  Plan.  - cont with Vanc and Rocephin  - ID following patient   - VDRL on CSF  - f/u CSF culture, HSV pcr, and blood culture  - flu panel and HIV negative .  - change IV Dilaudid 1 mg q3hr prn, pain tends to come back after 2-3hrs  - we can stop IVF since patient is taking orally.    # Left Inguinal Lymphadenopathy: likely related to the systemic flu symptoms he has leading to reactive lymphadenitis. No evidence of wounds on his LE.    Dispo: Disposition is deferred at this time, awaiting improvement of current medical problems. Likely  discharge tomorrow.  The patient does not have current PCP (No PCP Per Patient), therefore will be require OPC follow-up after discharge.   The patient does not have transportation limitations that hinder transportation to clinic appointments.  .Services Needed at time of discharge: Y = Yes, Blank = No PT:   OT:   RN:   Equipment:   Other:     Length of Stay: 1 days   Signed by:  Dow Adolph, MD PGY-2, Internal Medicine Pager [phone] 10/22/2013, 3:05 PM

## 2013-10-22 NOTE — H&P (Signed)
Internal Medicine Attending Admission Note Date: 10/22/2013  Patient name: Darrell King Medical record number: [id number] Date of birth: 02-09-1966 Age: 47 y.o. Gender: male  I saw and evaluated the patient. I reviewed the resident's note and I agree with the resident's findings and plan as documented in the resident's note, with the following additional comments.  Chief Complaint(s): Headache, fever  History - key components related to admission: Patient is a 47 year old man who presented to urgent care with fever and headaches with signs concerning for meningitis; he was referred to the [facility] emergency department for admission.  He reports that his symptoms began about 5 days prior to admission; before that he was feeling well, and his only acute complaints have been a small tender lump in his left groin area.  He denies any sick exposures, out of state travel, or tick exposure.   Physical Exam - key components related to admission:    Filed Vitals:   10/21/13 1600 10/21/13 1653 10/21/13 2056 10/22/13 0430  BP: 133/72 128/83 137/73 125/84  Pulse: 72 75 70 75  Temp:  99.4 F (37.4 C) 99.5 F (37.5 C) 99 F (37.2 C)  TempSrc:  Oral Oral Oral  Resp:  20 18 18   Height:  6\' 9"  (2.057 m)    Weight:  271 lb (122.925 kg)    SpO2: 92% 97% 96% 99%    General: Alert, oriented Neck: Some pain with neck flexion Lungs: Clear Heart: Regular; no extra sounds or murmurs Abdomen: Bowel sounds present, soft, nontender Extremities: No edema; there is a focal tender area in the left groin, with no palpable abnormality Neurologic: Alert and oriented; cranial nerves II through XII intact; motor 5 over 5 throughout; nonfocal   Lab results:   Basic Metabolic Panel:  Recent Labs  11/91/47 0244 10/21/13 1840 10/22/13 0419  NA 133*  --  137  K 3.7  --  4.0  CL 98  --  102  CO2 24  --  27  GLUCOSE 99  --  86  BUN 15  --  8  CREATININE 1.24 1.19 1.43*  CALCIUM 9.0  --  8.4     Liver Function Tests:  Recent Labs  10/21/13 0244 10/22/13 0419  AST 23 13  ALT 21 14  ALKPHOS 64 56  BILITOT 0.8 0.8  PROT 7.8 7.2  ALBUMIN 3.5 3.1*     CBC:  Recent Labs  10/21/13 1840 10/22/13 0419  WBC 8.4 8.4  HGB 13.3 13.1  HCT 38.7* 39.1  MCV 85.1 85.7  PLT 170 180    Recent Labs  10/21/13 0244  NEUTROABS 7.2  LYMPHSABS 1.8  MONOABS 0.9  EOSABS 0.3  BASOSABS 0.0    Cardiac Enzymes:  Recent Labs  10/22/13 0725  TROPONINI <0.30     Imaging results:  Dg Chest 2 View  10/21/2013   CLINICAL DATA:  Cough and fever for 4 days.  EXAM: CHEST  2 VIEW  COMPARISON:  None.  FINDINGS: The lungs are well-aerated. Pulmonary vascularity is at the upper limits of normal. There is no evidence of focal opacification, pleural effusion or pneumothorax.  The heart is normal in size; the mediastinal contour is within normal limits. No acute osseous abnormalities are seen.  IMPRESSION: No acute cardiopulmonary process seen.   Electronically Signed   By: Roanna Raider M.D.   On: 10/21/2013 04:06   Ct Head Wo Contrast  10/21/2013   CLINICAL DATA:  Headache and nausea. Fever. Focal  posterior neck tenderness.  EXAM: CT HEAD WITHOUT CONTRAST  CT CERVICAL SPINE WITHOUT CONTRAST  TECHNIQUE: Multidetector CT imaging of the head and cervical spine was performed following the standard protocol without intravenous contrast. Multiplanar CT image reconstructions of the cervical spine were also generated.  COMPARISON:  Cervical spine radiographs performed 06/02/2007  FINDINGS: CT HEAD FINDINGS  There is no evidence of acute infarction, mass lesion, or intra- or extra-axial hemorrhage on CT.  The posterior fossa, including the cerebellum, brainstem and fourth ventricle, is within normal limits. The third and lateral ventricles, and basal ganglia are unremarkable in appearance. The cerebral hemispheres are symmetric in appearance, with normal gray-white differentiation. No mass effect or  midline shift is seen.  There is no evidence of fracture; visualized osseous structures are unremarkable in appearance. The orbits are within normal limits. The paranasal sinuses and mastoid air cells are well-aerated. No significant soft tissue abnormalities are seen.  CT CERVICAL SPINE FINDINGS  There is no evidence of fracture or subluxation. Vertebral bodies demonstrate normal height and alignment. Intervertebral disc spaces are preserved. Prevertebral soft tissues are within normal limits. The visualized neural foramina are grossly unremarkable. There is no CT evidence to suggest discitis.  The thyroid gland is unremarkable in appearance. The visualized lung apices are clear. No significant soft tissue abnormalities are seen.  IMPRESSION: 1. Unremarkable noncontrast CT of the head. 2. No evidence of fracture or subluxation along the cervical spine. No CT evidence for discitis.   Electronically Signed   By: Roanna Raider M.D.   On: 10/21/2013 04:18   Ct Cervical Spine Wo Contrast  10/21/2013   CLINICAL DATA:  Headache and nausea. Fever. Focal posterior neck tenderness.  EXAM: CT HEAD WITHOUT CONTRAST  CT CERVICAL SPINE WITHOUT CONTRAST  TECHNIQUE: Multidetector CT imaging of the head and cervical spine was performed following the standard protocol without intravenous contrast. Multiplanar CT image reconstructions of the cervical spine were also generated.  COMPARISON:  Cervical spine radiographs performed 06/02/2007  FINDINGS: CT HEAD FINDINGS  There is no evidence of acute infarction, mass lesion, or intra- or extra-axial hemorrhage on CT.  The posterior fossa, including the cerebellum, brainstem and fourth ventricle, is within normal limits. The third and lateral ventricles, and basal ganglia are unremarkable in appearance. The cerebral hemispheres are symmetric in appearance, with normal gray-white differentiation. No mass effect or midline shift is seen.  There is no evidence of fracture; visualized  osseous structures are unremarkable in appearance. The orbits are within normal limits. The paranasal sinuses and mastoid air cells are well-aerated. No significant soft tissue abnormalities are seen.  CT CERVICAL SPINE FINDINGS  There is no evidence of fracture or subluxation. Vertebral bodies demonstrate normal height and alignment. Intervertebral disc spaces are preserved. Prevertebral soft tissues are within normal limits. The visualized neural foramina are grossly unremarkable. There is no CT evidence to suggest discitis.  The thyroid gland is unremarkable in appearance. The visualized lung apices are clear. No significant soft tissue abnormalities are seen.  IMPRESSION: 1. Unremarkable noncontrast CT of the head. 2. No evidence of fracture or subluxation along the cervical spine. No CT evidence for discitis.   Electronically Signed   By: Roanna Raider M.D.   On: 10/21/2013 04:18   Dg Chest Port 1 View  10/22/2013   CLINICAL DATA:  Short of breath.  EXAM: PORTABLE CHEST - 1 VIEW  COMPARISON:  10/21/2013  FINDINGS: The heart size and mediastinal contours are within normal limits. Both lungs are  clear. The visualized skeletal structures are unremarkable.  IMPRESSION: No active disease.   Electronically Signed   By: Amie Portland M.D.   On: 10/22/2013 08:35   Dg Lumbar Puncture Fluoro Guide  10/21/2013   CLINICAL DATA:  Severe headache and neck pain and fever.  EXAM: DIAGNOSTIC LUMBAR PUNCTURE UNDER FLUOROSCOPIC GUIDANCE  FLUOROSCOPY TIME:  0 min 14 seconds  PROCEDURE: Informed consent was obtained from the patient prior to the procedure, including potential complications of headache, allergy, and pain. With the patient prone, the lower back was prepped with Betadine. 1% Lidocaine was used for local anesthesia. Lumbar puncture was performed at the L3-4 level using a gauge needle with return of clear colorless CSF with an opening pressure of 20 cm water. 28ml of CSF were obtained for laboratory studies.  The patient tolerated the procedure well and there were no apparent complications. Closing pressure was 15 cm of water.  IMPRESSION: Lumbar puncture performed without difficulty or immediate complication.   Electronically Signed   By: Geanie Cooley M.D.   On: 10/21/2013 12:05     Assessment & Plan by Problem:  1.  Fever, chills, headache concerning for meningitis.  CSF was notable only for 5 WBCs; Gram stain was negative, and cultures are pending.  The plan is empiric IV antibiotics to cover for possible bacterial meningitis; symptomatic treatment of headache; await results of cultures.  ID consult by Dr. Orvan Falconer is appreciated.   2.  Other problems and plans as per the resident physician's note.

## 2013-10-22 NOTE — Progress Notes (Signed)
Pt.c/o chest pain ;V/S taken;T=99;B/P=125/84;R=18;O2 SAT=99 on RA;Dr.Jones was called & ordered stat EKG,& came to see pt.EKG results showed to DR.JONES.orders received.

## 2013-10-22 NOTE — Consult Note (Signed)
[facility]    Date of Admission:  10/21/2013           Day 1 vancomycin        Day 1 ceftriaxone       Reason for Consult: Fever and meningitis  Referring Physician: Dr. Dow Adolph     Active Problems:   Meningitis   Inguinal adenopathy   Fever and chills   . cefTRIAXone (ROCEPHIN)  IV  2 g Intravenous Q12H  . vancomycin  1,250 mg Intravenous Q12H    Recommendations: 1. Continue current antibiotics pending culture results 2. Okay to discontinue droplet precautions now   Assessment: Darrell King has very slight abnormalities on his spinal fluid analysis suggest possible early meningitis. There is no obvious parameningeal focus of bacterial infection seen on CT scan. The temporal association with the tender lymph node and papule on his left knee suggests the possibility of bacteremia related to a mild skin infection. I would certainly continue current antibiotics pending further observation and the results of his cultures. I will followup tomorrow.    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 Around that same time he noticed a small red bump on the inside of his left knee and some tender swelling in his left groin. His fever, chills and headache have progressed to the point where his headache is the worst of his life and rated 10 out of 10. He is previously healthy and takes no prescription medications.   Review of Systems: Constitutional: positive for anorexia, chills, fevers and malaise, negative for sweats and weight loss Eyes: positive for mild photophobia, negative for irritation, redness and visual disturbance Ears, nose, mouth, throat, and face: negative Respiratory: negative Cardiovascular: negative Gastrointestinal: negative Genitourinary:negative  Past Medical History  Diagnosis Date  . Asthma   . Refusal  of blood transfusions as patient is Jehovah's Witness   . Anxiety     "had taken Paxil; back in 1996; nothing since" (10/21/2013)    History  Substance Use Topics  . Smoking status: Never Smoker   . Smokeless tobacco: Never Used  . Alcohol Use: 0.6 oz/week    1 Shots of liquor per week    Family History  Problem Relation Age of Onset  . Cancer Mother    No Known Allergies  OBJECTIVE: Blood pressure 125/84, pulse 75, temperature 99 F (37.2 C), temperature source Oral, resp. rate 18, height 6\' 9"  (2.057 m), weight 122.925 kg (271 lb), SpO2 99.00%. General: He appears moderately uncomfortable due to headache Skin: Healing papule on the inner side of left knee Neck: Now supple Lymph nodes: Solitary, mildly tender left inguinal lymph node Lungs: Clear Cor: Regular S1 and S2 with no murmurs Abdomen: Soft and nontender Joints and extremities: No acute abnormalities  Lab Results Lab Results  Component Value Date   WBC 8.4 10/22/2013   HGB 13.1 10/22/2013   HCT 39.1 10/22/2013   MCV 85.7 10/22/2013   PLT 180 10/22/2013    Lab Results  Component Value Date   CREATININE 1.43* 10/22/2013   BUN 8 10/22/2013   NA 137 10/22/2013   K 4.0 10/22/2013   CL 102 10/22/2013   CO2 27 10/22/2013    Lab Results  Component Value Date   ALT 14 10/22/2013   AST 13 10/22/2013   ALKPHOS 56  10/22/2013   BILITOT 0.8 10/22/2013    CSF Tube 1: 11 red blood cells, 8 white blood cells predominantly lymphs, protein 38, glucose 62, Gram stain shows no organisms and cultures are pending  Microbiology: Recent Results (from the past 240 hour(s))  CSF CULTURE     Status: None   Collection Time    10/21/13  9:10 AM      Result Value Range Status   Specimen Description CSF   Final   Special Requests 7.0ML FLUID   Final   Gram Stain     Final   Value: WBC PRESENT, PREDOMINANTLY MONONUCLEAR     NO ORGANISMS SEEN     CYTOSPIN Performed at [facility]     Performed at [facility]   Culture PENDING   Incomplete   Report Status PENDING   Incomplete  GRAM STAIN     Status: None   Collection Time    10/21/13  9:10 AM      Result Value Range Status   Specimen Description CSF   Final   Special Requests 7.0 ML FLUID   Final   Gram Stain     Final   Value: CYTOSPIN SLIDE:     WBC PRESENT, PREDOMINANTLY MONONUCLEAR     NO ORGANISMS SEEN   Report Status 10/21/2013 FINAL   Final  CULTURE, BLOOD (ROUTINE X 2)     Status: None   Collection Time    10/21/13  6:40 PM      Result Value Range Status   Specimen Description BLOOD LEFT ARM   Final   Special Requests BOTTLES DRAWN AEROBIC ONLY 5CC   Final   Culture  Setup Time     Final   Value: 10/21/2013 20:54     Performed at [facility]   Culture     Final   Value:        BLOOD CULTURE RECEIVED NO GROWTH TO DATE CULTURE WILL BE HELD FOR 5 DAYS BEFORE ISSUING A FINAL NEGATIVE REPORT     Performed at [facility]   Report Status PENDING   Incomplete  CULTURE, BLOOD (ROUTINE X 2)     Status: None   Collection Time    10/21/13  6:45 PM      Result Value Range Status   Specimen Description BLOOD LEFT HAND   Final   Special Requests BOTTLES DRAWN AEROBIC AND ANAEROBIC 5CC   Final   Culture  Setup Time     Final   Value: 10/21/2013 20:53     Performed at [facility]   Culture     Final   Value:        BLOOD CULTURE RECEIVED NO GROWTH TO DATE CULTURE WILL BE HELD FOR 5 DAYS BEFORE ISSUING A FINAL NEGATIVE REPORT     Performed at [facility]   Report Status PENDING   Incomplete    Cliffton Asters, MD [facility] [phone] pager   [phone] cell 10/22/2013, 10:35 AM

## 2013-10-22 NOTE — Progress Notes (Addendum)
INTERNAL MEDICINE TEACHING SERVICE Night Float Progress Note   Subjective:    We were called overnght by the RN for evaluation of chest pain. Patient described a pain extending across his chest, 7/10 in severity, worse w/ deep inspiration and reproducible with palpation. At time of evaluation, pt A&Ox3, in no acute distress, responding to questions appropriately. Denies any associated symptoms; no SOB, nausea, vomiting, diaphoresis.    Objective:    BP 125/84  Pulse 75  Temp(Src) 99 F (37.2 C) (Oral)  Resp 18  Ht 6\' 9"  (2.057 m)  Wt 271 lb (122.925 kg)  BMI 29.05 kg/m2  SpO2 99%   Labs: Basic Metabolic Panel:    Component Value Date/Time   NA 133* 10/21/2013 0244   K 3.7 10/21/2013 0244   CL 98 10/21/2013 0244   CO2 24 10/21/2013 0244   BUN 15 10/21/2013 0244   CREATININE 1.19 10/21/2013 1840   GLUCOSE 99 10/21/2013 0244   CALCIUM 9.0 10/21/2013 0244    CBC:    Component Value Date/Time   WBC 8.4 10/22/2013 0419   WBC 7.9 10/20/2013 1826   HGB 13.1 10/22/2013 0419   HGB 15.1 10/20/2013 1826   HCT 39.1 10/22/2013 0419   HCT 48.2 10/20/2013 1826   PLT 180 10/22/2013 0419   MCV 85.7 10/22/2013 0419   MCV 91.0 10/20/2013 1826   NEUTROABS 7.2 10/21/2013 0244   LYMPHSABS 1.8 10/21/2013 0244   MONOABS 0.9 10/21/2013 0244   EOSABS 0.3 10/21/2013 0244   BASOSABS 0.0 10/21/2013 0244    Cardiac Enzymes: No results found for this basename: CKTOTAL, CKMB, CKMBINDEX, TROPONINI    Physical Exam: General: Vital signs reviewed and noted. Well-developed, well-nourished, in no acute distress; alert, appropriate and cooperative throughout examination.  Lungs:  Normal respiratory effort. Clear to auscultation BL without crackles or wheezes.  Heart: RRR. S1 and S2 normal without gallop, murmur, or rubs.  Abdomen:  BS normoactive. Soft, Nondistended, non-tender.  No masses or organomegaly.  Extremities: No pretibial edema.     Assessment/ Plan:   47 year old gentleman  without significant past medical history who presents with general malaise, fevers, headache, photophobia, neck pain for 5 days, now complaining of chest pain.  Chest pain- Patient w/ no previous cardiac history, TIMI score of 0. Describes pain worsened with deep inspiration. Presentation not typical cardiac-related chest pain, however, consider ACS at this time. No hypoxia, SOB, N/V, diaphoresis. No tachycardia on exam. Pain reproducible with palpation. Patient also with Well's Score of 0, PERC -ve, therefore D-dimer not indicated. PE not suspected given clinical presentation. -EKG shows NSR @ 65 bpm. No ST/T wave abnormalities. No previous EKG for comparison -Troponin x1 pending -CXR portable 1 view pending -Given Dilaudid 1 mg as previously given for headaches.    Courtney Paris, MD  10/22/2013, 4:59 AM

## 2013-10-23 DIAGNOSIS — L989 Disorder of the skin and subcutaneous tissue, unspecified: Secondary | ICD-10-CM

## 2013-10-23 LAB — HERPES SIMPLEX VIRUS(HSV) DNA BY PCR: HSV 2 DNA: NOT DETECTED

## 2013-10-23 LAB — BASIC METABOLIC PANEL
CO2: 28 mEq/L (ref 19–32)
Calcium: 8.9 mg/dL (ref 8.4–10.5)
Creatinine, Ser: 1.37 mg/dL — ABNORMAL HIGH (ref 0.50–1.35)
GFR calc Af Amer: 70 mL/min — ABNORMAL LOW (ref >90)
Potassium: 4.2 mEq/L (ref 3.5–5.1)

## 2013-10-23 MED ORDER — HYDROCODONE-ACETAMINOPHEN 5-325 MG PO TABS
1.0000 | ORAL_TABLET | Freq: Four times a day (QID) | ORAL | Status: DC | PRN
  Administered 2013-10-23 (×2): 2 via ORAL
  Filled 2013-10-23 (×2): qty 2

## 2013-10-23 MED ORDER — DOXYCYCLINE HYCLATE 100 MG PO CAPS: 100.0000 mg | ORAL_CAPSULE | Freq: Two times a day (BID) | ORAL | Status: AC

## 2013-10-23 MED ORDER — HYDROCODONE-ACETAMINOPHEN 5-325 MG PO TABS: 1.0000 | ORAL_TABLET | Freq: Four times a day (QID) | ORAL | Status: DC | PRN

## 2013-10-23 NOTE — Progress Notes (Signed)
Subjective: Says he is still having headaches. But the pain meds help. No previous hx of headaches. No nausea or vomiting. Has been afebrile since admission. Neck pain and stiffness has resolved.   Objective: Vital signs in last 24 hours: Filed Vitals:   10/22/13 1408 10/22/13 1902 10/22/13 2135 10/23/13 0542  BP: 123/75  149/82 122/80  Pulse: 77  80 63  Temp: 98.6 F (37 C) 98.1 F (36.7 C) 100.2 F (37.9 C) 99.1 F (37.3 C)  TempSrc: Oral Oral Oral Oral  Resp: 20  18 18   Height:      Weight:      SpO2: 100%  97% 94%   Weight change:   Intake/Output Summary (Last 24 hours) at 10/23/13 1150 Last data filed at 10/23/13 0602  Gross per 24 hour  Intake   3122 ml  Output      0 ml  Net   3122 ml   General appearance: alert, cooperative, appears stated age and no distress Head: Normocephalic, without obvious abnormality, atraumatic Neck: supple, symmetrical, trachea midline Lungs: clear to auscultation bilaterally Heart: regular rate and rhythm, S1, S2 normal, no murmur, click, rub or gallop Abdomen: soft, non-tender; bowel sounds normal; no masses,  no organomegaly Pulses: 2+ and symmetric, no pedal edema.  Lab Results: Basic Metabolic Panel:  Recent Labs Lab 10/22/13 0419 10/23/13 0545  NA 137 141  K 4.0 4.2  CL 102 103  CO2 27 28  GLUCOSE 86 96  BUN 8 7  CREATININE 1.43* 1.37*  CALCIUM 8.4 8.9   Liver Function Tests:  Recent Labs Lab 10/21/13 0244 10/22/13 0419  AST 23 13  ALT 21 14  ALKPHOS 64 56  BILITOT 0.8 0.8  PROT 7.8 7.2  ALBUMIN 3.5 3.1*   CBC:  Recent Labs Lab 10/21/13 0244 10/21/13 1840 10/22/13 0419  WBC 10.2 8.4 8.4  NEUTROABS 7.2  --   --   HGB 14.3 13.3 13.1  HCT 41.2 38.7* 39.1  MCV 84.9 85.1 85.7  PLT 197 170 180   Cardiac Enzymes:  Recent Labs Lab 10/22/13 0725  TROPONINI <0.30    Micro Results: Recent Results (from the past 240 hour(s))  CSF CULTURE     Status: None   Collection Time    10/21/13  9:10 AM      Result Value Range Status   Specimen Description CSF   Final   Special Requests 7.0ML FLUID   Final   Gram Stain     Final   Value: WBC PRESENT, PREDOMINANTLY MONONUCLEAR     NO ORGANISMS SEEN     CYTOSPIN Performed at [facility]     Performed at [facility]   Culture     Final   Value: NO GROWTH 1 DAY     Performed at [facility]   Report Status PENDING   Incomplete  GRAM STAIN     Status: None   Collection Time    10/21/13  9:10 AM      Result Value Range Status   Specimen Description CSF   Final   Special Requests 7.0 ML FLUID   Final   Gram Stain     Final   Value: CYTOSPIN SLIDE:     WBC PRESENT, PREDOMINANTLY MONONUCLEAR     NO ORGANISMS SEEN   Report Status 10/21/2013 FINAL   Final  CULTURE, BLOOD (ROUTINE X 2)     Status: None   Collection Time    10/21/13  6:40 PM      Result Value Range Status   Specimen Description BLOOD LEFT ARM   Final   Special Requests BOTTLES DRAWN AEROBIC ONLY 5CC   Final   Culture  Setup Time     Final   Value: 10/21/2013 20:54     Performed at [facility]   Culture     Final   Value:        BLOOD CULTURE RECEIVED NO GROWTH TO DATE CULTURE WILL BE HELD FOR 5 DAYS BEFORE ISSUING A FINAL NEGATIVE REPORT     Performed at [facility]   Report Status PENDING   Incomplete  CULTURE, BLOOD (ROUTINE X 2)     Status: None   Collection Time    10/21/13  6:45 PM      Result Value Range Status   Specimen Description BLOOD LEFT HAND   Final   Special Requests BOTTLES DRAWN AEROBIC AND ANAEROBIC 5CC   Final   Culture  Setup Time     Final   Value: 10/21/2013 20:53     Performed at [facility]   Culture     Final   Value:        BLOOD CULTURE RECEIVED NO GROWTH TO DATE CULTURE WILL BE HELD FOR 5 DAYS BEFORE ISSUING A FINAL NEGATIVE REPORT     Performed at [facility]   Report Status PENDING   Incomplete   Studies/Results: Dg Chest Port 1 View  10/22/2013   CLINICAL DATA:  Short of  breath.  EXAM: PORTABLE CHEST - 1 VIEW  COMPARISON:  10/21/2013  FINDINGS: The heart size and mediastinal contours are within normal limits. Both lungs are clear. The visualized skeletal structures are unremarkable.  IMPRESSION: No active disease.   Electronically Signed   By: Amie Portland M.D.   On: 10/22/2013 08:35   Medications: I have reviewed the patient's current medications. Scheduled Meds: . cefTRIAXone (ROCEPHIN)  IV  2 g Intravenous Q12H  . vancomycin  1,250 mg Intravenous Q12H   Continuous Infusions:  PRN Meds:.HYDROcodone-acetaminophen  Assessment/Plan: Active Problems:   Meningitis   Inguinal adenopathy   Fever and chills  # Aseptic Meningitis versus Bacterial meningitis: Considering initial symptoms of headache, and signs of meningeal irritation on physical exam as was severe headache.  Pt has been on pain meds and empirical antibiotics- Ceftriaxone and Vanc- 2nd day. Head CT scan was normal. Results of the CSF exam revealed WBC = 8 in tube #1, which fell to 5 in tube # 4. Differential count not done due to low WBC count. Other results including CSF appearance, grain stain, glucose, and protein normal. POCT influenza A/B performed from the urgent care negative. HIV is negative. Most likle cause is aseptic meningitis considering absence of typical CSF findings in bacterial meningitis. - Herpes simplex PCR, CSF cultures are pending.  - Cont with Vanc and Rocephin- 2nd day,  - ID following patient, appreciate recommendation. - Blood and CSF cultures- No growth till date, continue to follow. - VDRL on CSF- In process. - D/c IV Dilaudid 1 mg q3hr prn, commence oral pain meds- 5-325mg  1-2 tabs Q6H PRN. - Possible discahrge today, pending ID recs.   # Left Inguinal Lymphadenopathy: likely related to the systemic flu symptoms he has leading to reactive lymphadenitis. Has a small healing lesion, like a previous small abscess on his knee, likely cause of inguinal LN.   Dispo:  Disposition is deferred at this time, awaiting improvement  of current medical problems.   The patient does have a current PCP (No Pcp Per Patient) and does need an [facility] hospital follow-up appointment after discharge.  The patient does not know have transportation limitations that hinder transportation to clinic appointments.  .Services Needed at time of discharge: Y = Yes, Blank = No PT:   OT:   RN:   Equipment:   Other:     LOS: 2 days   Kennis Carina, MD 10/23/2013, 11:50 AM

## 2013-10-23 NOTE — Care Management Note (Signed)
    Page 1 of 1   10/23/2013     3:49:13 PM   CARE MANAGEMENT NOTE 10/23/2013  Patient:  Darrell King, Darrell King   Account Number:  [id number]  Date Initiated:  10/23/2013  Documentation initiated by:  Letha Cape  Subjective/Objective Assessment:   dx meningitis  admit-lives with family.     Action/Plan:   Anticipated DC Date:  10/23/2013   Anticipated DC Plan:  HOME/SELF CARE      DC Planning Services  CM consult      Choice offered to / List presented to:             Status of service:  Completed, signed off Medicare Important Message given?   (If response is "NO", the following Medicare IM given date fields will be blank) Date Medicare IM given:   Date Additional Medicare IM given:    Discharge Disposition:  HOME/SELF CARE  Per UR Regulation:  Reviewed for med. necessity/level of care/duration of stay  If discussed at Long Length of Stay Meetings, dates discussed:    Comments:  10/23/13 15:48 Letha Cape RN, BSN [phone] patient lives with family.  No NCM referral.  No needs antiicpated.

## 2013-10-23 NOTE — Progress Notes (Signed)
Nsg Discharge Note  Admit Date:  10/21/2013 Discharge date: 10/23/2013   Dushaun Okey to be D/C'd Home per MD order.  AVS completed.  Copy for chart, and copy for patient signed, and dated. Patient/caregiver able to verbalize understanding.  Discharge Medication:   Medication List    ASK your doctor about these medications       acetaminophen 325 MG tablet  Commonly known as:  TYLENOL  Take 650 mg by mouth every 6 (six) hours as needed for mild pain.        Discharge Assessment: Filed Vitals:   10/23/13 0542  BP: 122/80  Pulse: 63  Temp: 99.1 F (37.3 C)  Resp: 18   Skin clean, dry and intact without evidence of skin break down, no evidence of skin tears noted. IV catheter discontinued intact. Site without signs and symptoms of complications - no redness or edema noted at insertion site, patient denies c/o pain - only slight tenderness at site.  Dressing with slight pressure applied.  D/c Instructions-Education: Discharge instructions given to patient/family with verbalized understanding. D/c education completed with patient/family including follow up instructions, medication list, d/c activities limitations if indicated, with other d/c instructions as indicated by MD - patient able to verbalize understanding, all questions fully answered. Patient instructed to return to ED, call 911, or call MD for any changes in condition.  Patient escorted via WC, and D/C home via private auto.   Arwilda Georgia, Brooke Bonito, RN 10/23/2013 10:50 AM

## 2013-10-23 NOTE — Progress Notes (Signed)
Patient was discharged home via w/c with wife. Discharged instructions are in hand and all questions from wife were answered. Will continue to monitor.  Ruthellen Tippy J. Lendell Caprice RN

## 2013-10-23 NOTE — Progress Notes (Signed)
Patient ID: Darrell King, male   DOB: September 10, 1966, 47 y.o.   MRN: [id number]         [facility]    Date of Admission:  10/21/2013           Day 2 vancomycin        Day 2 ceftriaxone  Active Problems:   Meningitis   Inguinal adenopathy   Fever and chills   . cefTRIAXone (ROCEPHIN)  IV  2 g Intravenous Q12H  . vancomycin  1,250 mg Intravenous Q12H    Subjective: Upon entering his room he stated that he thinks he figured out what has been making him sleep. This morning he noticed some pus draining from the small lesion on his left knee. Overall he is feeling much better. His headache has improved and is well controlled with oral Vicodin. Review of Systems: Pertinent items are noted in HPI.  Past Medical History  Diagnosis Date  . Asthma   . Refusal of blood transfusions as patient is Jehovah's Witness   . Anxiety     "had taken Paxil; back in 1996; nothing since" (10/21/2013)    History  Substance Use Topics  . Smoking status: Never Smoker   . Smokeless tobacco: Never Used  . Alcohol Use: 0.6 oz/week    1 Shots of liquor per week    Family History  Problem Relation Age of Onset  . Cancer Mother     No Known Allergies  Objective: Temp:  [98.1 F (36.7 C)-100.2 F (37.9 C)] 98.5 F (36.9 C) (12/26 1411) Pulse Rate:  [63-80] 64 (12/26 1411) Resp:  [18] 18 (12/26 1411) BP: (122-149)/(80-91) 144/91 mmHg (12/26 1411) SpO2:  [94 %-98 %] 98 % (12/26 1411)  General: He is alert, comfortable and in no distress Skin: Small papule with central ulceration on his left medial knee. There is no drainage or surrounding cellulitis Lymph nodes: Small mildly tender lymph node in left groin Neck: Supple Lungs: Clear Cor: Regular S1 and S2 no murmurs Abdomen: Nontender Joints and extremities: Normal  Lab Results Lab Results  Component Value Date   WBC 8.4 10/22/2013   HGB 13.1 10/22/2013   HCT 39.1 10/22/2013   MCV 85.7 10/22/2013     PLT 180 10/22/2013    Lab Results  Component Value Date   CREATININE 1.37* 10/23/2013   BUN 7 10/23/2013   NA 141 10/23/2013   K 4.2 10/23/2013   CL 103 10/23/2013   CO2 28 10/23/2013    Lab Results  Component Value Date   ALT 14 10/22/2013   AST 13 10/22/2013   ALKPHOS 56 10/22/2013   BILITOT 0.8 10/22/2013      Microbiology: Recent Results (from the past 240 hour(s))  CSF CULTURE     Status: None   Collection Time    10/21/13  9:10 AM      Result Value Range Status   Specimen Description CSF   Final   Special Requests 7.0ML FLUID   Final   Gram Stain     Final   Value: WBC PRESENT, PREDOMINANTLY MONONUCLEAR     NO ORGANISMS SEEN     CYTOSPIN Performed at [facility]     Performed at [facility]   Culture     Final   Value: NO GROWTH 2 DAYS     Performed at [facility]   Report Status PENDING   Incomplete  GRAM STAIN     Status:  None   Collection Time    10/21/13  9:10 AM      Result Value Range Status   Specimen Description CSF   Final   Special Requests 7.0 ML FLUID   Final   Gram Stain     Final   Value: CYTOSPIN SLIDE:     WBC PRESENT, PREDOMINANTLY MONONUCLEAR     NO ORGANISMS SEEN   Report Status 10/21/2013 FINAL   Final  CULTURE, BLOOD (ROUTINE X 2)     Status: None   Collection Time    10/21/13  6:40 PM      Result Value Range Status   Specimen Description BLOOD LEFT ARM   Final   Special Requests BOTTLES DRAWN AEROBIC ONLY 5CC   Final   Culture  Setup Time     Final   Value: 10/21/2013 20:54     Performed at [facility]   Culture     Final   Value:        BLOOD CULTURE RECEIVED NO GROWTH TO DATE CULTURE WILL BE HELD FOR 5 DAYS BEFORE ISSUING A FINAL NEGATIVE REPORT     Performed at [facility]   Report Status PENDING   Incomplete  CULTURE, BLOOD (ROUTINE X 2)     Status: None   Collection Time    10/21/13  6:45 PM      Result Value Range Status   Specimen Description BLOOD LEFT HAND   Final    Special Requests BOTTLES DRAWN AEROBIC AND ANAEROBIC 5CC   Final   Culture  Setup Time     Final   Value: 10/21/2013 20:53     Performed at [facility]   Culture     Final   Value:        BLOOD CULTURE RECEIVED NO GROWTH TO DATE CULTURE WILL BE HELD FOR 5 DAYS BEFORE ISSUING A FINAL NEGATIVE REPORT     Performed at [facility]   Report Status PENDING   Incomplete    Studies/Results: Dg Chest Port 1 View  10/22/2013   CLINICAL DATA:  Short of breath.  EXAM: PORTABLE CHEST - 1 VIEW  COMPARISON:  10/21/2013  FINDINGS: The heart size and mediastinal contours are within normal limits. Both lungs are clear. The visualized skeletal structures are unremarkable.  IMPRESSION: No active disease.   Electronically Signed   By: Amie Portland M.D.   On: 10/22/2013 08:35    Assessment: He is showing much improvement with time and empiric antibiotics. So far blood and spinal fluid cultures are negative. He is very eager to go home. He will without positive cultures I am suspicious that he may have had a staph infection in the superficial lesion on his left knee. I would recommend switching to oral doxycycline and treating for 5 more days.  Plan: 1. Recommend changing IV antibiotics to doxycycline 100 mg twice daily by mouth for 5 more days 2. Okay with me to discharge home later today  Cliffton Asters, MD [facility] [phone] pager   [phone] cell 10/23/2013, 3:06 PM

## 2013-10-24 LAB — CSF CULTURE W GRAM STAIN

## 2013-10-25 NOTE — Discharge Summary (Signed)
Name: Darrell King MRN: [id number] DOB: [birth year] 47 y.o. PCP: No Pcp Per Patient  Date of Admission: 10/21/2013  2:00 AM Date of Discharge: 10/23/2013 Attending Physician: Farley Ly, MD  Discharge Diagnosis:  Active Problems:   Meningitis   Inguinal adenopathy   Fever and chills  Discharge Medications:   Medication List         acetaminophen 325 MG tablet  Commonly known as:  TYLENOL  Take 650 mg by mouth every 6 (six) hours as needed for mild pain.     doxycycline 100 MG capsule  Commonly known as:  VIBRAMYCIN  Take 1 capsule (100 mg total) by mouth 2 (two) times daily.     HYDROcodone-acetaminophen 5-325 MG per tablet  Commonly known as:  NORCO/VICODIN  Take 1-2 tablets by mouth every 6 (six) hours as needed for moderate pain.        Disposition and follow-up:   [name] was discharged from [facility] in Good condition.  At the hospital follow up visit please address:  1.  Resolution and non re-occurrence of headache and neck pain.  CSF VDRL- still in process. Final Blood culture results. Compliance with 5 days of oral doxycycline.  Follow-up Appointments: Will be contacted.   Discharge Instructions:     Discharge Orders   Future Orders Complete By Expires   Call MD for:  persistant nausea and vomiting  As directed    Call MD for:  severe uncontrolled pain  As directed    Diet - low sodium heart healthy  As directed    Discharge instructions  As directed    Comments:     We will be discharging you home to complete 5 days of doxycycline, you will take 1 tablet twice a day for 5 days. Please be sure to pick up the prescription and complete the dose. We will also be contacting you after the holidays to follow up with [place] in our clinic to ensure you are okay. We will also be prescribing some pain medications to help with your headache.   Increase activity slowly  As directed       Consultations:   Infectious Disease.  Procedures Performed:  Dg Chest 2 View  10/21/2013   CLINICAL DATA:  Cough and fever for 4 days.  EXAM: CHEST  2 VIEW  COMPARISON:  None.  FINDINGS: The lungs are well-aerated. Pulmonary vascularity is at the upper limits of normal. There is no evidence of focal opacification, pleural effusion or pneumothorax.  The heart is normal in size; the mediastinal contour is within normal limits. No acute osseous abnormalities are seen.  IMPRESSION: No acute cardiopulmonary process seen.   Electronically Signed   By: Roanna Raider M.D.   On: 10/21/2013 04:06   Ct Head Wo Contrast  10/21/2013   CLINICAL DATA:  Headache and nausea. Fever. Focal posterior neck tenderness.  EXAM: CT HEAD WITHOUT CONTRAST  CT CERVICAL SPINE WITHOUT CONTRAST  TECHNIQUE: Multidetector CT imaging of the head and cervical spine was performed following the standard protocol without intravenous contrast. Multiplanar CT image reconstructions of the cervical spine were also generated.  COMPARISON:  Cervical spine radiographs performed 06/02/2007  FINDINGS: CT HEAD FINDINGS  There is no evidence of acute infarction, mass lesion, or intra- or extra-axial hemorrhage on CT.  The posterior fossa, including the cerebellum, brainstem and fourth ventricle, is within normal limits. The third and lateral ventricles, and basal ganglia are unremarkable in appearance. The  cerebral hemispheres are symmetric in appearance, with normal gray-white differentiation. No mass effect or midline shift is seen.  There is no evidence of fracture; visualized osseous structures are unremarkable in appearance. The orbits are within normal limits. The paranasal sinuses and mastoid air cells are well-aerated. No significant soft tissue abnormalities are seen.  CT CERVICAL SPINE FINDINGS  There is no evidence of fracture or subluxation. Vertebral bodies demonstrate normal height and alignment. Intervertebral disc spaces are preserved. Prevertebral soft  tissues are within normal limits. The visualized neural foramina are grossly unremarkable. There is no CT evidence to suggest discitis.  The thyroid gland is unremarkable in appearance. The visualized lung apices are clear. No significant soft tissue abnormalities are seen.  IMPRESSION: 1. Unremarkable noncontrast CT of the head. 2. No evidence of fracture or subluxation along the cervical spine. No CT evidence for discitis.   Electronically Signed   By: Roanna Raider M.D.   On: 10/21/2013 04:18   Ct Cervical Spine Wo Contrast  10/21/2013   CLINICAL DATA:  Headache and nausea. Fever. Focal posterior neck tenderness.  EXAM: CT HEAD WITHOUT CONTRAST  CT CERVICAL SPINE WITHOUT CONTRAST  TECHNIQUE: Multidetector CT imaging of the head and cervical spine was performed following the standard protocol without intravenous contrast. Multiplanar CT image reconstructions of the cervical spine were also generated.  COMPARISON:  Cervical spine radiographs performed 06/02/2007  FINDINGS: CT HEAD FINDINGS  There is no evidence of acute infarction, mass lesion, or intra- or extra-axial hemorrhage on CT.  The posterior fossa, including the cerebellum, brainstem and fourth ventricle, is within normal limits. The third and lateral ventricles, and basal ganglia are unremarkable in appearance. The cerebral hemispheres are symmetric in appearance, with normal gray-white differentiation. No mass effect or midline shift is seen.  There is no evidence of fracture; visualized osseous structures are unremarkable in appearance. The orbits are within normal limits. The paranasal sinuses and mastoid air cells are well-aerated. No significant soft tissue abnormalities are seen.  CT CERVICAL SPINE FINDINGS  There is no evidence of fracture or subluxation. Vertebral bodies demonstrate normal height and alignment. Intervertebral disc spaces are preserved. Prevertebral soft tissues are within normal limits. The visualized neural foramina are  grossly unremarkable. There is no CT evidence to suggest discitis.  The thyroid gland is unremarkable in appearance. The visualized lung apices are clear. No significant soft tissue abnormalities are seen.  IMPRESSION: 1. Unremarkable noncontrast CT of the head. 2. No evidence of fracture or subluxation along the cervical spine. No CT evidence for discitis.   Electronically Signed   By: Roanna Raider M.D.   On: 10/21/2013 04:18   Dg Chest Port 1 View  10/22/2013   CLINICAL DATA:  Short of breath.  EXAM: PORTABLE CHEST - 1 VIEW  COMPARISON:  10/21/2013  FINDINGS: The heart size and mediastinal contours are within normal limits. Both lungs are clear. The visualized skeletal structures are unremarkable.  IMPRESSION: No active disease.   Electronically Signed   By: Amie Portland M.D.   On: 10/22/2013 08:35   Dg Lumbar Puncture Fluoro Guide  10/21/2013   CLINICAL DATA:  Severe headache and neck pain and fever.  EXAM: DIAGNOSTIC LUMBAR PUNCTURE UNDER FLUOROSCOPIC GUIDANCE  FLUOROSCOPY TIME:  0 min 14 seconds  PROCEDURE: Informed consent was obtained from the patient prior to the procedure, including potential complications of headache, allergy, and pain. With the patient prone, the lower back was prepped with Betadine. 1% Lidocaine was used for local anesthesia.  Lumbar puncture was performed at the L3-4 level using a gauge needle with return of clear colorless CSF with an opening pressure of 20 cm water. 28ml of CSF were obtained for laboratory studies. The patient tolerated the procedure well and there were no apparent complications. Closing pressure was 15 cm of water.  IMPRESSION: Lumbar puncture performed without difficulty or immediate complication.   Electronically Signed   By: Geanie Cooley M.D.   On: 10/21/2013 12:05   Admission ZOX:[id number] Complaint: Fevers, and headache.  History of Present Illness: This is a 47 year old gentleman without significant past medical history who presents with general  malaise, fevers, headache, photophobia, neck pain for 5 days.  Patient reports severe headache (10/10) which has not improved with over-the-counter pain medications since onset on Saturday. He was evaluated in urgent care one day prior to admission, where he was advised to immediately present to the emergency department due to concern of meningitis but he was unable to come until today. He describes his headache to be located in the occipital area and temporal area bilaterally. His headache extends from the back of his head down to both shoulders. He reports that his fevers have been reaching 102, associated with chills and rigors; they have been intermittent. His appetite has been poor, but he denies nausea, vomiting, abdominal pain, or diarrhea. However, he reports that his mouth has been feeling dry. He denies chest pain, cough, wheezing, sore throat, nasal congestion, and a or post nasal drip. He does not take home medications except OTC Tylenol, and ibuprofen for this current headaches. Patient reports that he has 2 cats at and he denies any recent scratches. He has not been out camping and he does not spend any significant time in the woods or grass. 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 His last maintaince work was onFriday, which is the day prior to onset of his headache. He is unsure whether any of his employers had flulike symptoms. 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  His only other complaint is a painful bump in the left groin which started a few days prior to onset of his headache and chills. He denies any wounds on his lower extremities. No history of trauma. He has a remote history of right inguinal hernia, which was operated several years ago.   Physical exam-  General: Patient appears miserable, but otherwise well developed, well nourished; no acute distressed, cooperative with exam, mildly dehydrated  Head: atraumatic, normocephalic, reports pain on  slight movement of his neck  Eye: pupils equal, round and reactive; sclera anicteric; normal conjunctiva  Nose/throat: oropharynx clear, moist mucous membranes, pink gums  Neck: without palpable nodules  Lungs/Chest wall: clear to auscultation bilaterally, normal work of breathing  Heart: normal rate and regular rhythm; no murmurs  Pulses: radial and dorsalis pedis pulses are 2+ and symmetric  Abdomen: Normal fullness, no rebound, guarding, or rigidity; normal bowel sounds; no masses or organomegaly. Genitalia exam is normal. Abdominal orifices without evidence of hernia. Right inguinal surgical scar.  Skin: warm, dry, intact, normal turgor, no rashes  Extremities: no peripheral edema, clubbing, or cyanosis. Left groin, without discernible discrete lymph node on palpation, however, he reports pain in the inguinal lymph node area. No signs of inflammation or infection.  Neurologic: A&O X3, CN II - XII are grossly intact. Motor strength is 5/5 in the all 4 extremities, Sensations intact to light touch.  Psych: patient is alert and oriented, mood  and affect are normal and congruent, thought content is normal without delusions, thought process is linear, speech is normal and non-pressured, behavior is normal  Hospital Course by problem list: Active Problems:   Meningitis   Inguinal adenopathy   Fever and chills   # Aseptic Meningitis: Patient presented with severe headache, neck pain and signs of meningeal irritation on physical exam present for several days. Highest temp recorded on admission was 100.59F. Most concerning was a possible bacterial meningitis. Blood cultures, CBC, lumber puncture was done, also CT head, and cervical spine Wo Contrast, with a chest xray were also done. For all the Imaging done- overall impression were all unremarkable. CBC- on admission- WBC- 7.9, and remained within normal limits throughout admission. HIV was negative, POCT influenza A/B performed at the urgent care on  the day prior to admission where negative. Results of the CSF exam revealed WBC = 8, RBC- 11 in tube #1, and then RBC- 1 and WBC- 5 in tube # 4. Differential count not done due to low WBC count. CSF appearance, grain stain, glucose, and protein normal. Based on CSF findings possibility of a bacterial meningitis was low, but there was still some concern. ID was consulted , recommendation was for empiric Antibiotic therapy with Vancomycin and ceftriaxone until final culture results were available. Antibiotics were given for a duration for 2 days. Pt was also given Dilaudid for pain control, and the switched to oral pain meds, IVF 150cc/hr maintenance fluid after 3L given in the ED. Final CSF cultures- no growth, CSF HSV still pending, Preliminary blood culture results- no growth. An assessment of aseptic meningitis was therefore made. By the 2nd day, patient had significant improvement in his symptoms. Recommendation by ID was for Oral doxycycline 100 mg twice daily by mouth for 5 more days. Patient was therefore discharged home on antibiotics and Vicodin for headaches. Patient will be contacted by our clinic for follow up visit after the holidays.  # Left Inguinal Lymphadenopathy: Patient has a draining furncle on the medial side of his left knee, likely cause of inguinal lymphadenopathy.   Discharge Vitals:   BP 144/91  Pulse 64  Temp(Src) 98.5 F (36.9 C) (Oral)  Resp 18  Ht 6\' 9"  (2.057 m)  Wt 271 lb (122.925 kg)  BMI 29.05 kg/m2  SpO2 98%  Discharge Labs:  Results for orders placed during the hospital encounter of 10/21/13 (from the past 24 hour(s))  BASIC METABOLIC PANEL     Status: Abnormal   Collection Time    10/23/13  5:45 AM      Result Value Range   Sodium 141  135 - 145 mEq/L   Potassium 4.2  3.5 - 5.1 mEq/L   Chloride 103  96 - 112 mEq/L   CO2 28  19 - 32 mEq/L   Glucose, Bld 96  70 - 99 mg/dL   BUN 7  6 - 23 mg/dL   Creatinine, Ser 4.09 (*) 0.50 - 1.35 mg/dL   Calcium 8.9   8.4 - 81.1 mg/dL   GFR calc non Af Amer 60 (*) >90 mL/min   GFR calc Af Amer 70 (*) >90 mL/min    Signed: Kennis Carina, MD 10/23/2013, 3:57 PM   Time Spent on Discharge: 35 minutes Services Ordered on Discharge: None Equipment Ordered on Discharge: None

## 2013-10-26 LAB — VDRL, CSF: VDRL Quant, CSF: NONREACTIVE

## 2013-10-27 LAB — CULTURE, BLOOD (ROUTINE X 2): Culture: NO GROWTH

## 2013-10-30 ENCOUNTER — Encounter: Payer: Self-pay | Admitting: Internal Medicine

## 2013-10-30 ENCOUNTER — Ambulatory Visit (INDEPENDENT_AMBULATORY_CARE_PROVIDER_SITE_OTHER): Payer: BC Managed Care – PPO | Admitting: Internal Medicine

## 2013-10-30 VITALS — BP 131/92 | HR 95 | Temp 97.3°F | Ht >= 80 in | Wt 268.4 lb

## 2013-10-30 DIAGNOSIS — G039 Meningitis, unspecified: Secondary | ICD-10-CM

## 2013-10-30 DIAGNOSIS — A879 Viral meningitis, unspecified: Secondary | ICD-10-CM

## 2013-10-30 DIAGNOSIS — G030 Nonpyogenic meningitis: Secondary | ICD-10-CM

## 2013-10-30 LAB — CBC WITH DIFFERENTIAL/PLATELET
Basophils Absolute: 0 10*3/uL (ref 0.0–0.1)
Basophils Relative: 1 % (ref 0–1)
EOS PCT: 4 % (ref 0–5)
Eosinophils Absolute: 0.3 10*3/uL (ref 0.0–0.7)
HEMATOCRIT: 45.3 % (ref 39.0–52.0)
HEMOGLOBIN: 15.9 g/dL (ref 13.0–17.0)
Lymphocytes Relative: 43 % (ref 12–46)
Lymphs Abs: 2.8 10*3/uL (ref 0.7–4.0)
MCH: 28.5 pg (ref 26.0–34.0)
MCHC: 35.1 g/dL (ref 30.0–36.0)
MCV: 81.3 fL (ref 78.0–100.0)
MONOS PCT: 7 % (ref 3–12)
Monocytes Absolute: 0.4 10*3/uL (ref 0.1–1.0)
NEUTROS ABS: 3 10*3/uL (ref 1.7–7.7)
Neutrophils Relative %: 45 % (ref 43–77)
Platelets: 323 10*3/uL (ref 150–400)
RBC: 5.57 MIL/uL (ref 4.22–5.81)
RDW: 13.5 % (ref 11.5–15.5)
WBC: 6.4 10*3/uL (ref 4.0–10.5)

## 2013-10-30 LAB — BASIC METABOLIC PANEL WITH GFR
BUN: 15 mg/dL (ref 6–23)
CHLORIDE: 101 meq/L (ref 96–112)
CO2: 28 meq/L (ref 19–32)
CREATININE: 1.1 mg/dL (ref 0.50–1.35)
Calcium: 9.8 mg/dL (ref 8.4–10.5)
GFR, EST NON AFRICAN AMERICAN: 80 mL/min
GFR, Est African American: >89 mL/min
GLUCOSE: 74 mg/dL (ref 70–99)
POTASSIUM: 4.2 meq/L (ref 3.5–5.3)
Sodium: 138 mEq/L (ref 135–145)

## 2013-10-30 MED ORDER — IBUPROFEN 600 MG PO TABS: 600.0000 mg | ORAL_TABLET | Freq: Three times a day (TID) | ORAL | Status: DC | PRN

## 2013-10-30 NOTE — Patient Instructions (Addendum)
1.  Stop taking Vicodin.  Please take an anti-inflammatory medication such as ibuprofen (Motrin, Advil) or naproxen sodium (Aleve) as needed for headache symptoms. I have sent a prescription for 600mg  ibuprofen (take 1 pill three times per day as needed).  You can fill the prescription or buy over the counter anti-inflammatory such as Motrin, Advil or Aleve.    If you buy over the counter:  If you buy Motrin or Advil they will come in smaller strengths (200mg ) and you will need to take 3 tablets at once to equal 600mg  and you can take 3 times per day.  If you buy Aleve do not take more than 1-2 tablets every 12 hours. Your headache should begin to improve as your recover from aseptic meningitis.    2. If you have worsening of your symptoms or new symptoms arise, please call the clinic [phone]), or go to the ER immediately if symptoms are severe.  3. Return to clinic on 01/05 or 01/06 for follow-up.  Viral Meningitis Meningitis is an inflammation of the fluid and membranes surrounding the spinal cord and brain. Viral meningitis is caused by a viral infection. It is important to know whether a virus or bacterium is causing your meningitis. Viral meningitis is generally less severe than bacterial meningitis. Aseptic meningitis is any meningitis not caused by a bacterial infection, including viral meningitis. Meningitis can also be caused by fungi, parasites, certain medicines, cancer, and autoimmune disorders. Uncomplicated meningitis usually resolves on its own in 7 to 10 days.However, there can be complicated cases that last much longer. People with lowered immune systems are at greater risk for poor outcomes from meningitis.It is important to get treatment as soon as possible to minimize the impact of a meningitis infection. Long-term complications could include seizures, hearing loss, weakness, paralysis, blindness, or cognitive impairment. CAUSES Most cases of meningitis are due to viruses.  Viruses that cause meningitis include enteroviruses (such as polio and coxsackie viruses), herpes simplex virus, varicella zoster virus, mumps, and HIV. SYMPTOMS  Symptoms can develop over many hours. They may even take a few days to develop. Common symptoms of meningitis in people over the age of 2 years include:  High fever.  Headache.  Stiff neck.  Irritability.  Nausea and vomiting.  Fatigue.  Discomfort from exposure to light.  Discomfort from exposure to loud noise.  Trouble walking.  Altered mental status.  Seizures. DIAGNOSIS  Early diagnosis and treatment are very important. If you have symptoms, you should see your caregiver right away. Your evaluation may include lab tests of your blood and spinal fluid. A spinal fluid sample is taken through a procedure called a lumbar puncture or spinal tap. During the procedure, a needle is inserted into an area in the lower back. You may also have a CT scan of your brain as part of your evaluation.If there is suspicion of an infection or inflammation of the brain (encephalitis), an MRI scan may be done. TREATMENT   Antibiotics are not effective against a virus. Antiviral medicines may be used depending on the cause of your meningitis.  Treatment for viral meningitis focuses on reducing your symptoms. This may include rest, hydration, and medicines to reduce fever and pain.  Steroids may also be used if there is significant swelling of the brain. PREVENTION  Vaccines can help prevent meningitis caused by polio, measles, and mumps.  Strict hand washing is effective in controlling the spread of many causes of meningitis.  Using barrier protection during sexual  intercourse can prevent the spread of meningitis caused by viruses such as the herpes virus or HIV.  Protection from mosquitoes, especially for the very young and very old, can prevent some causes of meningitis. HOME CARE INSTRUCTIONS  Rest.  Drink enough water and  fluids to keep your urine clear or pale yellow.  Take all medicine as prescribed.  Practice good hygiene to prevent others from getting sick.  Follow up with your caregiver as directed. SEEK IMMEDIATE MEDICAL CARE IF:  You develop dizziness.  You have a very fast heartbeat.  You have difficulty breathing.  You develop confusion.  You have seizures.  You have unstoppable nausea and vomiting.  You develop any worsening symptoms. MAKE SURE YOU:  Understand these instructions.  Will watch your condition.  Will get help right away if you are not doing well or get worse. Document Released: 10/05/2002 Document Revised: 01/07/2012 Document Reviewed: 01/30/2011 [facility] Patient Information 2014 [place], [place].

## 2013-10-30 NOTE — Progress Notes (Signed)
   Subjective:    Patient ID: Darrell King, male    DOB: Mar 03, 1966, 48 y.o.   MRN: [id number]  HPI Comments: Darrell King is a 48 year old previously healthy male who presents for hospital follow-up after admission for aseptic meningitis.  He was started on empiric IV antibiotics and subsequently had negative work-up for bacterial meningitis.  He was discharged on Vicodin for pain and 5 days of Doxycyline which he says he completed.  He feels he is overall feeling better than when he was admitted with improvement in his neck stiffness and resolution of his fever.  However, his headache has persisted and he now has dizziness, N/V, photophobia and visual disturbance.  He describes blurred and double vision that is new since discharge.  He denies any past medical history.  He is eating but his appetite is not as great as it has been in the past.  He was prescribed Vicodin for pain but he feels it does not work as well as Tylenol so he has stopped taking it.  He has not tried anything else for his symptoms.     Review of Systems  Constitutional: Positive for appetite change. Negative for fever.  Eyes: Positive for photophobia and visual disturbance.       He reports blurry and double vision.  Respiratory: Negative for shortness of breath.   Cardiovascular: Negative for chest pain.  Gastrointestinal: Positive for nausea and vomiting.  Genitourinary: Negative for dysuria.  Musculoskeletal: Positive for neck pain. Negative for gait problem and neck stiffness.  Neurological: Positive for dizziness and headaches. Negative for syncope.  Psychiatric/Behavioral: Negative for confusion.       Objective:   Physical Exam  Vitals reviewed. Constitutional: He is oriented to person, place, and time. He appears well-developed and well-nourished. No distress.  Laying down on exam table with lights dimmed for comfort.  HENT:  Head: Normocephalic and atraumatic.  Mouth/Throat: Oropharynx is clear  and moist. No oropharyngeal exudate.  Eyes: EOM are normal. Pupils are equal, round, and reactive to light. No scleral icterus.  Visual acuity 20/15 both eyes without correction.  Neck: Normal range of motion. Neck supple.  Able to bring chin to chest, rotate and sidebend neck.  Cardiovascular: Normal rate, regular rhythm and normal heart sounds.   He is not orthostatic.  Pulmonary/Chest: Effort normal and breath sounds normal. No respiratory distress. He has no wheezes. He has no rales.  Abdominal: Soft. Bowel sounds are normal. He exhibits no distension. There is no tenderness.  Musculoskeletal: Normal range of motion. He exhibits no edema and no tenderness.  Lymphadenopathy:    He has no cervical adenopathy.  Neurological: He is alert and oriented to person, place, and time. No cranial nerve deficit. Coordination normal.  Skin: Skin is warm. He is not diaphoretic.  Psychiatric: He has a normal mood and affect. His behavior is normal.          Assessment & Plan:  Please problem based assessment and plan.

## 2013-10-31 NOTE — Assessment & Plan Note (Addendum)
Assessment:  His fever has resolved but he continues to experience headache and has developed blurry vision/diplopia, N/V since discharge.  He says his neck stiffness has improved and he is able to touch chin to chest without difficulty.  Visual acuity is intact.  He is afebrile and has a normal WBC.  He has recent unremarkable head CT, he lacks chemosis, proptosis or focal neurologic deficit making cerebral venous thrombosis less likely.   His headache is likely due to his viral meningitis and may take time to resolve completely.  His N/V and dizziness seems to coincide with beginning Vicodin and doxycycline at hospital discharge and are likely an ADR of either one or both of these medications.        Plan:  1) Stop taking Vicodin.  Start taking an anti-inflammatory (rx or OTC).  Ibuprofen 600mg  sent to pharmacy.            2) Return to clinic on 11/03/2012 for follow-up.  If visual symptoms have not improved or if he has worsening of symptoms may need further imaging and/or referral to ophthalmology.

## 2013-11-03 ENCOUNTER — Encounter: Payer: Self-pay | Admitting: Internal Medicine

## 2013-11-03 ENCOUNTER — Ambulatory Visit (INDEPENDENT_AMBULATORY_CARE_PROVIDER_SITE_OTHER): Payer: BC Managed Care – PPO | Admitting: Internal Medicine

## 2013-11-03 VITALS — BP 139/94 | HR 82 | Temp 97.2°F | Ht >= 80 in | Wt 272.4 lb

## 2013-11-03 DIAGNOSIS — K921 Melena: Secondary | ICD-10-CM

## 2013-11-03 DIAGNOSIS — G039 Meningitis, unspecified: Secondary | ICD-10-CM

## 2013-11-03 DIAGNOSIS — R197 Diarrhea, unspecified: Secondary | ICD-10-CM

## 2013-11-03 NOTE — Assessment & Plan Note (Addendum)
Assessment:  May be hemorrhoids but he has no hemorrhoidal symptoms and no bright red blood.  Unlikely due to ibuprofen as he has only been taking for a few days.  No abdominal pain but has had some diarrhea so IBD cannot be ruled out.  He has colon cancer in a first degree relative (his mother) so will refer for colonoscopy to rule out malignancy.  Colonoscopy would also pick up IBD or other colon pathology.  Plan:  1) refer for colonoscopy            2) FOBT

## 2013-11-03 NOTE — Assessment & Plan Note (Addendum)
Assessment:  He looks and feels greatly improved.  He has full ROM of his neck, decreased headache and N/V and visual symptoms have resolved.  He denies diplopia or blurred vision and has no pain on EOM.  No CN deficit appreciated.  I expect he will continue to improve and have complete resolution of his headache.  Plan:  He was advised to continue ibuprofen TID as needed. Take smallest effective dose for shortest duration possible.  Take with food to prevent stomach upset.

## 2013-11-03 NOTE — Patient Instructions (Signed)
1. You may continue to take ibuprofen as needed.  You can take 200-674m up to three times per day if needed.  Take the smallest dose that works for your discomfort.  Make sure to take it with a meal to prevent stomach upset.   2. Please provide a stool sample in the kit provided and return it to clinic as soon as possible.  If there is any sign of infection we will contact you.  You will be contacted regarding appointment with gastroenterologist for colonoscopy.   3. If you have worsening of your symptoms or new symptoms arise, please call the clinic [phone], or go to the ER immediately if symptoms are severe.

## 2013-11-03 NOTE — Progress Notes (Signed)
   Subjective:    Patient ID: Darrell King, male    DOB: February 26, 1966, 48 y.o.   MRN: [id number]  HPI Comments: Darrell King is a 48 year old previously healthy male who is here for follow-up.  He was recently hospitalized for aseptic meningitis and I saw him for hospital follow-up four days ago.  At that time his neck stiffness and fever had resolved but he had developed new symptoms of N/V, diplopia and blurred vision.  His headache was also still present.  At that time he was taking Tylenol for headache because he did not feel the Vicodin was working.  I advised him to take a high dose anti-inflammatory and return to clinic for follow-up.    Today he is feeling much better.  He says his headache is 2/10 and he feel like he is ready to return to work.  He denies fever/chills, myalgias, diplopia, blurred vision or dizziness.  He does report >10 episodes of diarrhea yesterday.  He noted a small amount of blood in his stool on one occasion.  He says the blood was dark and not bright red.  He denies abdominal pain, anal itching, N/V or sick contacts.  No one else in the home is sick.  He was on antibiotics in the hospital and completed a course of doxycycline after discharge.  He has no personal medical history and has never needed colonoscopy.  His mother has colon cancer.      Review of Systems  Constitutional: Negative for fever, chills and appetite change.  Eyes: Negative for photophobia and visual disturbance.  Respiratory: Negative for shortness of breath.   Cardiovascular: Negative for chest pain.  Gastrointestinal: Positive for diarrhea and blood in stool. Negative for nausea, vomiting, abdominal pain and constipation.  Genitourinary: Negative for dysuria.  Musculoskeletal: Negative for neck stiffness.  Neurological: Positive for headaches. Negative for dizziness.       Objective:   Physical Exam  Vitals reviewed. Constitutional: He is oriented to person, place, and time. He  appears well-developed and well-nourished. No distress.  HENT:  Head: Normocephalic and atraumatic.  Mouth/Throat: Oropharynx is clear and moist. No oropharyngeal exudate.  Eyes: EOM are normal. Pupils are equal, round, and reactive to light. No scleral icterus.  Visual acuity 20/20 OU  Neck: Normal range of motion. Neck supple.  Cardiovascular: Normal rate, regular rhythm and normal heart sounds.   Pulmonary/Chest: Effort normal and breath sounds normal. No respiratory distress. He has no wheezes. He has no rales.  Abdominal: Soft. Bowel sounds are normal. He exhibits no distension and no mass. There is no tenderness. There is no rebound and no guarding.  Musculoskeletal: Normal range of motion.  Neurological: He is alert and oriented to person, place, and time. No cranial nerve deficit. He exhibits normal muscle tone.  Skin: Skin is warm. He is not diaphoretic.  Psychiatric: He has a normal mood and affect. His behavior is normal.          Assessment & Plan:  Please see problem based assessment and plan.

## 2013-11-03 NOTE — Assessment & Plan Note (Addendum)
Assessment:  He thinks his diarrhea may be due to fruit and ice cream he consumed yesterday.  But he says he does not normally get severed diarrhea with ice cream.  He may have been exposed to a virus that has passed, however given the fact that he was recently hospitalized and exposed to antibiotic there is concern for c.diff.   He is hemodynamically stable, has had no diarrhea today and is tolerating po.  Plan:   1) He was provided with a stool sample kit to return to clinic for c.diff testing.  2) Advised to call clinic if continued diarrhea and go to the ED if severe or blood noted.

## 2013-11-05 NOTE — Progress Notes (Signed)
I saw and evaluated the patient.  I personally confirmed the key portions of the history and exam documented by Dr. Wilson and I reviewed pertinent patient test results.  The assessment, diagnosis, and plan were formulated together and I agree with the documentation in the resident's note. 

## 2013-11-10 NOTE — Progress Notes (Signed)
I saw and evaluated the patient.  I personally confirmed the key portions of the history and exam documented by Dr. Andrey CampanileWilson and I reviewed pertinent patient test results.  The assessment, diagnosis, and plan were formulated together and I agree with the documentation in the resident's note.  Would have low threshold to rescan head with CT if symptoms do not improve.

## 2013-11-11 ENCOUNTER — Observation Stay (HOSPITAL_COMMUNITY)
Admission: AD | Admit: 2013-11-11 | Discharge: 2013-11-13 | Disposition: A | Discharge status: Home / Self Care | Payer: BC Managed Care – PPO | Source: Ambulatory Visit | Attending: Internal Medicine | Admitting: Internal Medicine

## 2013-11-11 ENCOUNTER — Ambulatory Visit (INDEPENDENT_AMBULATORY_CARE_PROVIDER_SITE_OTHER): Payer: BC Managed Care – PPO | Admitting: Internal Medicine

## 2013-11-11 ENCOUNTER — Encounter: Payer: Self-pay | Admitting: Internal Medicine

## 2013-11-11 ENCOUNTER — Encounter (HOSPITAL_COMMUNITY): Payer: Self-pay | Admitting: General Practice

## 2013-11-11 VITALS — BP 127/80 | HR 81 | Temp 97.1°F | Ht >= 80 in | Wt 279.0 lb

## 2013-11-11 DIAGNOSIS — K4030 Unilateral inguinal hernia, with obstruction, without gangrene, not specified as recurrent: Principal | ICD-10-CM | POA: Insufficient documentation

## 2013-11-11 DIAGNOSIS — K4090 Unilateral inguinal hernia, without obstruction or gangrene, not specified as recurrent: Secondary | ICD-10-CM | POA: Diagnosis present

## 2013-11-11 DIAGNOSIS — J45909 Unspecified asthma, uncomplicated: Secondary | ICD-10-CM

## 2013-11-11 DIAGNOSIS — F411 Generalized anxiety disorder: Secondary | ICD-10-CM | POA: Insufficient documentation

## 2013-11-11 DIAGNOSIS — Z23 Encounter for immunization: Secondary | ICD-10-CM | POA: Insufficient documentation

## 2013-11-11 DIAGNOSIS — K921 Melena: Secondary | ICD-10-CM

## 2013-11-11 LAB — CBC WITH DIFFERENTIAL/PLATELET
Basophils Absolute: 0 10*3/uL (ref 0.0–0.1)
Basophils Relative: 1 % (ref 0–1)
Eosinophils Absolute: 0.3 10*3/uL (ref 0.0–0.7)
Eosinophils Relative: 5 % (ref 0–5)
HEMATOCRIT: 42.1 % (ref 39.0–52.0)
HEMOGLOBIN: 14.6 g/dL (ref 13.0–17.0)
Lymphocytes Relative: 42 % (ref 12–46)
Lymphs Abs: 2.4 10*3/uL (ref 0.7–4.0)
MCH: 28.8 pg (ref 26.0–34.0)
MCHC: 34.7 g/dL (ref 30.0–36.0)
MCV: 83 fL (ref 78.0–100.0)
MONOS PCT: 8 % (ref 3–12)
Monocytes Absolute: 0.4 10*3/uL (ref 0.1–1.0)
NEUTROS PCT: 45 % (ref 43–77)
Neutro Abs: 2.6 10*3/uL (ref 1.7–7.7)
Platelets: 177 10*3/uL (ref 150–400)
RBC: 5.07 MIL/uL (ref 4.22–5.81)
RDW: 13.7 % (ref 11.5–15.5)
WBC: 5.7 10*3/uL (ref 4.0–10.5)

## 2013-11-11 LAB — COMPREHENSIVE METABOLIC PANEL
ALK PHOS: 55 U/L (ref 39–117)
ALT: 27 U/L (ref 0–53)
AST: 26 U/L (ref 0–37)
Albumin: 3.9 g/dL (ref 3.5–5.2)
BILIRUBIN TOTAL: 0.6 mg/dL (ref 0.3–1.2)
BUN: 13 mg/dL (ref 6–23)
CHLORIDE: 101 meq/L (ref 96–112)
CO2: 27 mEq/L (ref 19–32)
Calcium: 9.7 mg/dL (ref 8.4–10.5)
Creatinine, Ser: 1.09 mg/dL (ref 0.50–1.35)
GFR, EST NON AFRICAN AMERICAN: 80 mL/min — AB (ref >90)
Glucose, Bld: 81 mg/dL (ref 70–99)
POTASSIUM: 4.1 meq/L (ref 3.7–5.3)
Sodium: 142 mEq/L (ref 137–147)
Total Protein: 7.9 g/dL (ref 6.0–8.3)

## 2013-11-11 LAB — PROTIME-INR
INR: 0.98 (ref 0.00–1.49)
PROTHROMBIN TIME: 12.8 s (ref 11.6–15.2)

## 2013-11-11 LAB — LACTIC ACID, PLASMA: LACTIC ACID, VENOUS: 1.3 mmol/L (ref 0.5–2.2)

## 2013-11-11 MED ORDER — INFLUENZA VAC SPLIT QUAD 0.5 ML IM SUSP
0.5000 mL | INTRAMUSCULAR | Status: AC
  Administered 2013-11-12: 0.5 mL via INTRAMUSCULAR
  Filled 2013-11-11: qty 0.5

## 2013-11-11 MED ORDER — ACETAMINOPHEN 650 MG RE SUPP: 650.0000 mg | Freq: Four times a day (QID) | RECTAL | Status: DC | PRN

## 2013-11-11 MED ORDER — DEXTROSE-NACL 5-0.9 % IV SOLN
INTRAVENOUS | Status: DC
  Administered 2013-11-11 – 2013-11-12 (×2): via INTRAVENOUS

## 2013-11-11 MED ORDER — ONDANSETRON HCL 4 MG PO TABS: 4.0000 mg | ORAL_TABLET | Freq: Four times a day (QID) | ORAL | Status: DC | PRN

## 2013-11-11 MED ORDER — MORPHINE SULFATE 2 MG/ML IJ SOLN
1.0000 mg | Freq: Once | INTRAMUSCULAR | Status: AC
  Administered 2013-11-11: 1 mg via INTRAVENOUS
  Filled 2013-11-11: qty 1

## 2013-11-11 MED ORDER — PNEUMOCOCCAL VAC POLYVALENT 25 MCG/0.5ML IJ INJ
0.5000 mL | INJECTION | INTRAMUSCULAR | Status: AC
  Administered 2013-11-12: 0.5 mL via INTRAMUSCULAR
  Filled 2013-11-11: qty 0.5

## 2013-11-11 MED ORDER — ACETAMINOPHEN 325 MG PO TABS: 650.0000 mg | ORAL_TABLET | Freq: Four times a day (QID) | ORAL | Status: DC | PRN

## 2013-11-11 MED ORDER — ALBUTEROL SULFATE HFA 108 (90 BASE) MCG/ACT IN AERS: 1.0000 | INHALATION_SPRAY | Freq: Four times a day (QID) | RESPIRATORY_TRACT | Status: DC | PRN

## 2013-11-11 MED ORDER — MORPHINE SULFATE 4 MG/ML IJ SOLN: 4.0000 mg | Freq: Once | INTRAMUSCULAR | Status: DC

## 2013-11-11 MED ORDER — MORPHINE SULFATE 2 MG/ML IJ SOLN
1.0000 mg | INTRAMUSCULAR | Status: DC | PRN
  Administered 2013-11-12 (×2): 1 mg via INTRAVENOUS
  Filled 2013-11-11 (×2): qty 1

## 2013-11-11 MED ORDER — ALBUTEROL SULFATE (2.5 MG/3ML) 0.083% IN NEBU: 2.5000 mg | INHALATION_SOLUTION | Freq: Four times a day (QID) | RESPIRATORY_TRACT | Status: DC | PRN

## 2013-11-11 MED ORDER — ONDANSETRON HCL 4 MG/2ML IJ SOLN: 4.0000 mg | Freq: Four times a day (QID) | INTRAMUSCULAR | Status: DC | PRN

## 2013-11-11 MED ORDER — SODIUM CHLORIDE 0.9 % IV SOLN
INTRAVENOUS | Status: DC
  Administered 2013-11-11: 21:00:00 via INTRAVENOUS

## 2013-11-11 MED ORDER — MORPHINE SULFATE 2 MG/ML IJ SOLN
1.0000 mg | Freq: Four times a day (QID) | INTRAMUSCULAR | Status: DC | PRN
  Administered 2013-11-11: 1 mg via INTRAVENOUS
  Filled 2013-11-11: qty 1

## 2013-11-11 MED ORDER — HEPARIN SODIUM (PORCINE) 5000 UNIT/ML IJ SOLN: 5000.0000 [IU] | Freq: Three times a day (TID) | INTRAMUSCULAR | Status: DC

## 2013-11-11 NOTE — Progress Notes (Signed)
Report called to Alona BeneJoyce, RN on [address].  Pt transported via wheelchair to [address].  Pt alert and oriented.  Angelina OkGladys Eidan Muellner, RN 11/11/2013 5:24 PM.

## 2013-11-11 NOTE — H&P (Signed)
Date: 11/11/2013               Patient Name:  Darrell King MRN: [id number]  DOB: Jun 16, 1966 Age / Sex: 48 y.o., male   PCP: No Pcp Per Patient           Medical Service: Internal Medicine Teaching Service         Attending Physician: Dr. Sid Falcon, MD    First Contact: Chalmers Cater, MS-4 Pager: [phone]  Second Contact: Dr. Jerene Pitch, MD Pager: [phone]       After Hours (After 5p/  First Contact Pager: [phone]  weekends / holidays): Second Contact Pager: [phone]    Most Recent Discharge Date:  10/23/13  Chief Complaint: No chief complaint on file.      History of present illness: Pt is a 48 y.o. male with a PMH of right inguinal repair (19 yrs ago), anxiety, and a remote h/o asthma who is a direct admit to the clinic for worsening left inguinal pain for the past 2 weeks.  He was recently hospitalized for aseptic meningitis and after coughing he was in extreme pain and noticed a bulge in his left inguinal area that was reducible.  He states the pain is constant, throbbing, 8/10, and unrelieved by ibuprofen which he has used at home.  He reports a mild residual HA from meningitis, but denies fever/chills, URI symptoms, CP, SOB, N/V/D/C, or urinary symptoms.  He is eating and drinking normally and has a good appetite.  He reports recently observing blood in his stool which will be followed up with colonoscopy.  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    Meds: Current Facility-Administered Medications  Medication Dose Route Frequency Provider Last Rate Last Dose  . 0.9 %  sodium chloride infusion   Intravenous Continuous Blain Pais, MD      . acetaminophen (TYLENOL) tablet 650 mg  650 mg Oral Q6H PRN Blain Pais, MD       Or  . acetaminophen (TYLENOL) suppository 650 mg  650 mg Rectal Q6H PRN Blain Pais, MD      . albuterol (PROVENTIL) (2.5 MG/3ML) 0.083% nebulizer solution 2.5 mg  2.5 mg Nebulization Q6H PRN  Sid Falcon, MD      . dextrose 5 %-0.9 % sodium chloride infusion   Intravenous Continuous Ralene Ok, MD 100 mL/hr at 11/11/13 2037    . heparin injection 5,000 Units  5,000 Units Subcutaneous Q8H Blain Pais, MD      . morphine 2 MG/ML injection 1 mg  1 mg Intravenous Q6H PRN Blain Pais, MD      . ondansetron (ZOFRAN) tablet 4 mg  4 mg Oral Q6H PRN Blain Pais, MD       Or  . ondansetron (ZOFRAN) injection 4 mg  4 mg Intravenous Q6H PRN Blain Pais, MD        Prescriptions prior to admission  Medication Sig Dispense Refill  . ibuprofen (ADVIL,MOTRIN) 600 MG tablet Take 600 mg by mouth every 6 (six) hours as needed for moderate pain.        Allergies: Allergies as of 11/11/2013  . (No Known Allergies)    PMH: Past Medical History  Diagnosis Date  . Asthma   . Refusal of blood transfusions as patient is Jehovah's Witness   . Anxiety     "had taken Paxil; back in 1996; nothing since" (10/21/2013)    PSH:  Past Surgical History  Procedure Laterality Date  . Inguinal hernia repair Right 1995    FH: Family History  Problem Relation Age of Onset  . Cancer Mother     SH: History  Substance Use Topics  . Smoking status: Never Smoker   . Smokeless tobacco: Never Used  . Alcohol Use: 0.6 oz/week    1 Shots of liquor per week     Comment: Occasionally.    Review of Systems: Pertinent items are noted in HPI.  Physical Exam: Blood pressure 141/87, pulse 71, temperature 97.7 F (36.5 C), temperature source Oral, resp. rate 18, height 6' 9"  (2.057 m), weight 126.554 kg (279 lb), SpO2 100.00%.  Physical Exam  Constitutional: He is oriented to person, place, and time and well-developed, well-nourished, and in no distress.  HENT:  Head: Normocephalic and atraumatic.  Eyes: Conjunctivae and EOM are normal. Pupils are equal, round, and reactive to light.  Neck: Normal range of motion. Neck supple.  Cardiovascular: Normal rate,  regular rhythm, normal heart sounds and intact distal pulses.  Exam reveals no gallop and no friction rub.   No murmur heard. Pulmonary/Chest: Effort normal and breath sounds normal. No respiratory distress. He has no wheezes. He has no rales.  Abdominal: Soft. Bowel sounds are normal. There is tenderness (left inguinal area). A hernia is present. Hernia confirmed positive in the left inguinal area.    left inguinal hernia is reducible  Neurological: He is alert and oriented to person, place, and time.  Skin: Skin is warm and dry.    Lab results:  Basic Metabolic Panel:  Recent Labs  11/11/13 1900  NA 142  K 4.1  CL 101  CO2 27  GLUCOSE 81  BUN 13  CREATININE 1.09  CALCIUM 9.7   Anion Gap: 14  Liver Function Tests:  Recent Labs  11/11/13 1900  AST 26  ALT 27  ALKPHOS 55  BILITOT 0.6  PROT 7.9  ALBUMIN 3.9   No results found for this basename: LIPASE, AMYLASE,  in the last 72 hours No results found for this basename: AMMONIA,  in the last 72 hours  CBC: Lab Results  Component Value Date   WBC 5.7 11/11/2013   HGB 14.6 11/11/2013   HCT 42.1 11/11/2013   MCV 83.0 11/11/2013   PLT 177 11/11/2013    Cardiac Enzymes: No results found for this basename: TROPIPOC,  in the last 72 hours Lab Results  Component Value Date   TROPONINI <0.30 10/22/2013    BNP: No results found for this basename: PROBNP,  in the last 72 hours  D-Dimer: No results found for this basename: DDIMER,  in the last 72 hours  CBG: No results found for this basename: GLUCAP,  in the last 72 hours  Hemoglobin A1C: No results found for this basename: HGBA1C,  in the last 72 hours  Lipid Panel: No results found for this basename: CHOL, HDL, LDLCALC, TRIG, CHOLHDL, LDLDIRECT,  in the last 72 hours  Thyroid Function Tests: No results found for this basename: TSH, T4TOTAL, FREET4, T3FREE, THYROIDAB,  in the last 72 hours  Anemia Panel: No results found for this basename: VITAMINB12,  FOLATE, FERRITIN, TIBC, IRON, RETICCTPCT,  in the last 72 hours  Coagulation:  Recent Labs  11/11/13 1900  LABPROT 12.8  INR 0.98    Urine Drug Screen: Drugs of Abuse:  No results found for this basename: labopia,  cocainscrnur,  labbenz,  amphetmu,  thcu,  labbarb    Alcohol  Level: No results found for this basename: ETH,  in the last 72 hours  Urinalysis: No results found for this basename: COLORURINE, APPERANCEUR, LABSPEC, [place], GLUCOSEU, HGBUR, BILIRUBINUR, KETONESUR, PROTEINUR, UROBILINOGEN, NITRITE, LEUKOCYTESUR,  in the last 72 hours  Imaging results:  No results found.  EKG:  Date/Time:    Ventricular Rate:    PR Interval:    QRS Duration:   QT Interval:    QTC Calculation:   R Axis:     Text Interpretation:     Antibiotics: Antibiotics Given (last 72 hours)   None      Anti-infectives   None      SIRS/Sepsis/Septic Shock criteria met: no   Consults: Treatment Team:  Md Ccs, MD   Assessment & Plan by Problem: Principal Problem:   Left inguinal hernia Active Problems:   Blood in stool   Inguinal hernia  Pt is a 48 y.o. male with a PMH of right inguinal repair (19 yrs ago), anxiety, and a remote h/o asthma who is a direct admit to the clinic for worsening left inguinal pain for the past 2 weeks.   #Left inguinal hernia- Surgery consulted and felt it was not emergent secondary to the fact that it is reducible.  Pt would like it to be repaired during his hospital stay.   -appreciate surgery consult -morphine 41m q4 PRN -zofran PRN nausea  #h/o asthma- Stable.  Not on rescue inhalers.  -albuterol nebs q6PRN  #FEN- NS-D5NS  Electrolytes-Replete as needed  Diet-NPO    #VTE prophylaxis- SCDs    #Dispo- Disposition is deferred at this time, awaiting improvement of current medical problems.  Anticipated discharge in approximately 1-2 day(s).    Emergency Contact: Contact Information   Name Relation Home Work Mobile   Stankovich,Tracy  Spouse   [phone]  BPullmanRelative [phone]     The patient does not have a current PCP (No Pcp Per Patient) and [facility]  Signed: JMichail Jewels MD PGY-1, Internal Medicine  11/11/2013, 8:50 PM

## 2013-11-11 NOTE — Consult Note (Signed)
Reason for Consult:left inguinal hernia Referring Physician: Dr. Alphonzo Cruise is an 48 y.o. male.  HPI: the patient is a 48 year old otherwise healthy male with a three-day history of left inguinal pain. He stated that he coughed 3 days ago and noticed a left inguinal pain and bulge. He states that the bulge usually comes out after being on his feet all day. He states he is able to reduce it and has been reduced every night.  The patient was admitted by medicine and surgical consultation was obtained. The patient states he's had previous right inguinal hernia repaired in open fashion approximately 19 years ago.  Of note the patient was recent hospital secondary to aseptic viral meningitis.  Past Medical History  Diagnosis Date  . Asthma   . Refusal of blood transfusions as patient is Jehovah's Witness   . Anxiety     "had taken Paxil; back in 1996; nothing since" (10/21/2013)    Past Surgical History  Procedure Laterality Date  . Inguinal hernia repair Right 1995    Family History  Problem Relation Age of Onset  . Cancer Mother     Social History:  reports that he has never smoked. He has never used smokeless tobacco. He reports that he drinks about 0.6 ounces of alcohol per week. He reports that he does not use illicit drugs.  Allergies: No Known Allergies  Medications: I have reviewed the patient's current medications.  No results found for this or any previous visit (from the past 48 hour(s)).  No results found.  Review of Systems  Constitutional: Negative for weight loss.  HENT: Negative for ear discharge, ear pain, hearing loss and tinnitus.   Eyes: Negative for blurred vision, double vision, photophobia and pain.  Respiratory: Negative for cough, sputum production and shortness of breath.   Cardiovascular: Negative for chest pain.  Gastrointestinal: Positive for abdominal pain. Negative for nausea and vomiting.  Genitourinary: Negative for dysuria,  urgency, frequency and flank pain.  Musculoskeletal: Negative for back pain, falls, joint pain, myalgias and neck pain.  Neurological: Negative for dizziness, tingling, sensory change, focal weakness, loss of consciousness and headaches.  Endo/Heme/Allergies: Does not bruise/bleed easily.  Psychiatric/Behavioral: Negative for depression, memory loss and substance abuse. The patient is not nervous/anxious.    Blood pressure 141/87, pulse 71, temperature 97.7 F (36.5 C), temperature source Oral, resp. rate 18, SpO2 100.00%. Physical Exam  Vitals reviewed. Constitutional: He is oriented to person, place, and time. He appears well-developed and well-nourished. He is cooperative. No distress. Cervical collar and nasal cannula in place.  HENT:  Head: Normocephalic and atraumatic. Head is without raccoon's eyes, without Battle's sign, without abrasion, without contusion and without laceration.  Right Ear: Hearing, tympanic membrane, external ear and ear canal normal. No lacerations. No drainage or tenderness. No foreign bodies. Tympanic membrane is not perforated. No hemotympanum.  Left Ear: Hearing, tympanic membrane, external ear and ear canal normal. No lacerations. No drainage or tenderness. No foreign bodies. Tympanic membrane is not perforated. No hemotympanum.  Nose: Nose normal. No nose lacerations, sinus tenderness, nasal deformity or nasal septal hematoma. No epistaxis.  Mouth/Throat: Uvula is midline, oropharynx is clear and moist and mucous membranes are normal. No lacerations.  Eyes: Conjunctivae, EOM and lids are normal. Pupils are equal, round, and reactive to light. No scleral icterus.  Neck: Trachea normal. No JVD present. No spinous process tenderness and no muscular tenderness present. Carotid bruit is not present. No thyromegaly present.  Cardiovascular:  Normal rate, regular rhythm, normal heart sounds, intact distal pulses and normal pulses.   Respiratory: Effort normal and breath  sounds normal. No respiratory distress. He exhibits no tenderness, no bony tenderness, no laceration and no crepitus.  GI: Soft. Normal appearance and bowel sounds are normal. He exhibits no distension. There is no tenderness. There is no rigidity, no rebound, no guarding and no CVA tenderness. A hernia is present. Hernia confirmed positive in the left inguinal area. Hernia confirmed negative in the right inguinal area.    Musculoskeletal: Normal range of motion. He exhibits no edema and no tenderness.  Lymphadenopathy:    He has no cervical adenopathy.  Neurological: He is alert and oriented to person, place, and time. He has normal strength. No cranial nerve deficit or sensory deficit. GCS eye subscore is 4. GCS verbal subscore is 5. GCS motor subscore is 6.  Skin: Skin is warm, dry and intact. He is not diaphoretic.  Psychiatric: He has a normal mood and affect. His speech is normal and behavior is normal.    Assessment/Plan: 48 year old male with a reducible left inguinal hernia.  1.  I discussed the patient that this is not an emergent condition. Secondary to the fact that the patient is able to reduce it and it continues to reduce the normal fashion this could potentially be done as an outpatient. The patient states that secondary to the pain he would like this repaired all this hospital stay.  2. I discussed with him the possibility secondary to the emergent/scheduled cases that have been previously scheduled he may possibly be discharged and scheduled as an outpatient. The patient understood that this is a possibility. All discussed the case with Dr. Luisa Hartornett in the morning.  Marigene Ehlersamirez Jr., Angee Gupton 11/11/2013, 6:37 PM

## 2013-11-11 NOTE — Progress Notes (Signed)
Subjective:   Patient ID: Darrell King male   DOB: [birth year] 48 y.o.   MRN: [id number]  HPI: DarrellTorence Quan King is a 47 y.o. man with a pmhx of right inguinal herniation 20 years ago and recent aseptic meningitis who presents with a cc of groin pain. The patient was in his normal state of health until 3 days ago. The patient reports that he sneezed and felt immediate groin pain. He noticed a bulge in the left inguinal region. He has had worsening pain since that time that is now 8/10. He denies N/V/D/C. He reports normal bowel movements. He denies fevers or chills. He states that this is identical to his previous hernia 20 years ago.    Past Medical History  Diagnosis Date  . Asthma   . Refusal of blood transfusions as patient is Jehovah's Witness   . Anxiety     "had taken Paxil; back in 1996; nothing since" (10/21/2013)   Current Outpatient Prescriptions  Medication Sig Dispense Refill  . acetaminophen (TYLENOL) 325 MG tablet Take 650 mg by mouth every 6 (six) hours as needed for mild pain.      Marland Kitchen HYDROcodone-acetaminophen (NORCO/VICODIN) 5-325 MG per tablet        No current facility-administered medications for this visit.   Family History  Problem Relation Age of Onset  . Cancer Mother    History   Social History  . Marital Status: Married    Spouse Name: N/A    Number of Children: N/A  . Years of Education: N/A   Social History Main Topics  . Smoking status: Never Smoker   . Smokeless tobacco: Never Used  . Alcohol Use: 0.6 oz/week    1 Shots of liquor per week     Comment: Occasionally.  . Drug Use: No  . Sexual Activity: None   Other Topics Concern  . None   Social History Narrative  . None   Review of Systems: Review of Systems  Constitutional: Negative for fever, chills and malaise/fatigue.  HENT: Negative for sore throat.   Respiratory: Negative for cough, sputum production and shortness of breath.   Gastrointestinal: Positive  for abdominal pain. Negative for heartburn, nausea, vomiting, diarrhea, constipation, blood in stool and melena.  Genitourinary: Negative for dysuria, urgency and frequency.  Neurological: Negative for weakness and headaches.    Objective:  Physical Exam: Filed Vitals:   11/11/13 1505  BP: 127/80  Pulse: 81  Temp: 97.1 F (36.2 C)  TempSrc: Oral  Height: 6\' 9"  (2.057 m)  Weight: 279 lb (126.554 kg)  SpO2: 97%   Physical Exam  Constitutional: He is oriented to person, place, and time. He appears well-developed and well-nourished. No distress.  HENT:  Head: Normocephalic.  Mouth/Throat: Oropharynx is clear and moist.  Abdominal: Soft. Bowel sounds are normal. He exhibits no distension. There is tenderness. There is guarding. There is no rebound. A hernia is present. Hernia confirmed positive in the left inguinal area. Hernia confirmed negative in the right inguinal area.    Firm irreducible painful mass just superior and lateral to the pubic bone. Severely tender to palpation.  Neurological: He is alert and oriented to person, place, and time.  Skin: He is not diaphoretic.  Psychiatric: He has a normal mood and affect. His behavior is normal.    Assessment & Plan:   Inguinal Hernia The patient has a left sided inguinal hernia that is concerning for incarceration as it is severely painful and irreducible (for  the last three days). The patient has stable vital signs and does not have a fever. The patient does not appear septic at this time. I recommended the patient to be admitted to observation. I recommend that he have emergent consultation with general surgery.

## 2013-11-12 ENCOUNTER — Encounter (HOSPITAL_COMMUNITY)
Admission: AD | Disposition: A | Discharge status: Home / Self Care | Payer: Self-pay | Source: Ambulatory Visit | Attending: Internal Medicine

## 2013-11-12 ENCOUNTER — Encounter (HOSPITAL_COMMUNITY): Payer: BC Managed Care – PPO | Admitting: Anesthesiology

## 2013-11-12 ENCOUNTER — Encounter (HOSPITAL_COMMUNITY): Payer: Self-pay | Admitting: Anesthesiology

## 2013-11-12 ENCOUNTER — Observation Stay (HOSPITAL_COMMUNITY): Payer: BC Managed Care – PPO | Admitting: Anesthesiology

## 2013-11-12 HISTORY — PX: INGUINAL HERNIA REPAIR: SHX194

## 2013-11-12 LAB — SURGICAL PCR SCREEN
MRSA, PCR: NEGATIVE
STAPHYLOCOCCUS AUREUS: NEGATIVE

## 2013-11-12 SURGERY — REPAIR, HERNIA, INGUINAL, INCARCERATED
Anesthesia: General | Site: Groin | Laterality: Left

## 2013-11-12 MED ORDER — FENTANYL CITRATE 0.05 MG/ML IJ SOLN
INTRAMUSCULAR | Status: DC | PRN
  Administered 2013-11-12: 150 ug via INTRAVENOUS

## 2013-11-12 MED ORDER — ROCURONIUM BROMIDE 100 MG/10ML IV SOLN
INTRAVENOUS | Status: DC | PRN
  Administered 2013-11-12: 35 mg via INTRAVENOUS

## 2013-11-12 MED ORDER — LIDOCAINE HCL (CARDIAC) 20 MG/ML IV SOLN
INTRAVENOUS | Status: DC | PRN
  Administered 2013-11-12: 100 mg via INTRAVENOUS

## 2013-11-12 MED ORDER — HYDROMORPHONE HCL PF 1 MG/ML IJ SOLN
INTRAMUSCULAR | Status: AC
  Administered 2013-11-12: 0.5 mg via INTRAVENOUS
  Filled 2013-11-12: qty 1

## 2013-11-12 MED ORDER — DEXTROSE-NACL 5-0.9 % IV SOLN
INTRAVENOUS | Status: DC
Start: 2013-11-12 — End: 2013-11-13

## 2013-11-12 MED ORDER — LACTATED RINGERS IV SOLN
INTRAVENOUS | Status: DC | PRN
  Administered 2013-11-12: 15:00:00 via INTRAVENOUS

## 2013-11-12 MED ORDER — ONDANSETRON HCL 4 MG/2ML IJ SOLN
INTRAMUSCULAR | Status: DC | PRN
  Administered 2013-11-12: 4 mg via INTRAVENOUS

## 2013-11-12 MED ORDER — BUPIVACAINE-EPINEPHRINE (PF) 0.5% -1:200000 IJ SOLN
INTRAMUSCULAR | Status: AC
  Filled 2013-11-12: qty 10

## 2013-11-12 MED ORDER — BUPIVACAINE-EPINEPHRINE 0.5% -1:200000 IJ SOLN
INTRAMUSCULAR | Status: DC | PRN
  Administered 2013-11-12: 30 mL

## 2013-11-12 MED ORDER — 0.9 % SODIUM CHLORIDE (POUR BTL) OPTIME
TOPICAL | Status: DC | PRN
  Administered 2013-11-12: 1000 mL

## 2013-11-12 MED ORDER — MORPHINE SULFATE 2 MG/ML IJ SOLN
2.0000 mg | INTRAMUSCULAR | Status: DC | PRN
  Administered 2013-11-12 (×2): 2 mg via INTRAVENOUS
  Administered 2013-11-13: 4 mg via INTRAVENOUS
  Filled 2013-11-12 (×2): qty 1
  Filled 2013-11-12 (×2): qty 2

## 2013-11-12 MED ORDER — HYDROCODONE-ACETAMINOPHEN 5-325 MG PO TABS: 1.0000 | ORAL_TABLET | ORAL | Status: DC | PRN

## 2013-11-12 MED ORDER — LACTATED RINGERS IV SOLN
INTRAVENOUS | Status: DC
  Administered 2013-11-12: 15:00:00 via INTRAVENOUS

## 2013-11-12 MED ORDER — DEXAMETHASONE SODIUM PHOSPHATE 4 MG/ML IJ SOLN
INTRAMUSCULAR | Status: DC | PRN
  Administered 2013-11-12: 8 mg via INTRAVENOUS

## 2013-11-12 MED ORDER — HYDROMORPHONE HCL PF 1 MG/ML IJ SOLN
0.2500 mg | INTRAMUSCULAR | Status: DC | PRN
  Administered 2013-11-12 (×2): 0.5 mg via INTRAVENOUS

## 2013-11-12 MED ORDER — NEOSTIGMINE METHYLSULFATE 1 MG/ML IJ SOLN
INTRAMUSCULAR | Status: DC | PRN
  Administered 2013-11-12: 5 mg via INTRAVENOUS

## 2013-11-12 MED ORDER — PHENYLEPHRINE HCL 10 MG/ML IJ SOLN
INTRAMUSCULAR | Status: DC | PRN
  Administered 2013-11-12 (×5): 80 ug via INTRAVENOUS

## 2013-11-12 MED ORDER — CEFAZOLIN SODIUM-DEXTROSE 2-3 GM-% IV SOLR
2.0000 g | INTRAVENOUS | Status: AC
  Administered 2013-11-12: 2 g via INTRAVENOUS
  Filled 2013-11-12 (×2): qty 50

## 2013-11-12 MED ORDER — SUCCINYLCHOLINE CHLORIDE 20 MG/ML IJ SOLN
INTRAMUSCULAR | Status: DC | PRN
  Administered 2013-11-12: 120 mg via INTRAVENOUS

## 2013-11-12 MED ORDER — ONDANSETRON HCL 4 MG/2ML IJ SOLN: 4.0000 mg | Freq: Once | INTRAMUSCULAR | Status: DC | PRN

## 2013-11-12 MED ORDER — GLYCOPYRROLATE 0.2 MG/ML IJ SOLN
INTRAMUSCULAR | Status: DC | PRN
  Administered 2013-11-12: 0.6 mg via INTRAVENOUS

## 2013-11-12 MED ORDER — ONDANSETRON HCL 4 MG PO TABS: 4.0000 mg | ORAL_TABLET | Freq: Three times a day (TID) | ORAL | Status: DC | PRN

## 2013-11-12 MED ORDER — ARTIFICIAL TEARS OP OINT
TOPICAL_OINTMENT | OPHTHALMIC | Status: DC | PRN
  Administered 2013-11-12: 1 via OPHTHALMIC

## 2013-11-12 MED ORDER — DIPHENHYDRAMINE HCL 25 MG PO CAPS
25.0000 mg | ORAL_CAPSULE | Freq: Four times a day (QID) | ORAL | Status: DC | PRN
  Administered 2013-11-12: 25 mg via ORAL
  Filled 2013-11-12: qty 1

## 2013-11-12 MED ORDER — MIDAZOLAM HCL 5 MG/5ML IJ SOLN
INTRAMUSCULAR | Status: DC | PRN
  Administered 2013-11-12: 2 mg via INTRAVENOUS

## 2013-11-12 MED ORDER — PROPOFOL 10 MG/ML IV BOLUS
INTRAVENOUS | Status: DC | PRN
  Administered 2013-11-12: 200 mg via INTRAVENOUS

## 2013-11-12 MED ORDER — HYDROCODONE-ACETAMINOPHEN 5-325 MG PO TABS
1.0000 | ORAL_TABLET | ORAL | Status: DC | PRN
  Administered 2013-11-12 (×2): 1 via ORAL
  Administered 2013-11-13 (×3): 2 via ORAL
  Filled 2013-11-12: qty 1
  Filled 2013-11-12 (×3): qty 2
  Filled 2013-11-12: qty 1

## 2013-11-12 SURGICAL SUPPLY — 48 items
BENZOIN TINCTURE PRP APPL 2/3 (GAUZE/BANDAGES/DRESSINGS) ×2 IMPLANT
BLADE SURG 10 STRL SS (BLADE) ×2 IMPLANT
BLADE SURG 15 STRL LF DISP TIS (BLADE) ×1 IMPLANT
BLADE SURG 15 STRL SS (BLADE) ×1
BLADE SURG ROTATE 9660 (MISCELLANEOUS) ×2 IMPLANT
CHLORAPREP W/TINT 26ML (MISCELLANEOUS) ×2 IMPLANT
COVER SURGICAL LIGHT HANDLE (MISCELLANEOUS) ×2 IMPLANT
DRAIN PENROSE 1/2X12 LTX STRL (WOUND CARE) ×2 IMPLANT
DRAPE INCISE IOBAN 66X45 STRL (DRAPES) ×2 IMPLANT
DRAPE LAPAROTOMY TRNSV 102X78 (DRAPE) ×2 IMPLANT
DRAPE UTILITY 15X26 W/TAPE STR (DRAPE) ×4 IMPLANT
DRESSING TELFA 8X3 (GAUZE/BANDAGES/DRESSINGS) ×2 IMPLANT
DRSG OPSITE 4X5.5 SM (GAUZE/BANDAGES/DRESSINGS) ×2 IMPLANT
DRSG OPSITE 6X11 MED (GAUZE/BANDAGES/DRESSINGS) ×2 IMPLANT
ELECT CAUTERY BLADE 6.4 (BLADE) ×2 IMPLANT
ELECT REM PT RETURN 9FT ADLT (ELECTROSURGICAL) ×2
ELECTRODE REM PT RTRN 9FT ADLT (ELECTROSURGICAL) ×1 IMPLANT
GLOVE BIO SURGEON STRL SZ7.5 (GLOVE) ×2 IMPLANT
GLOVE BIOGEL PI IND STRL 6.5 (GLOVE) ×1 IMPLANT
GLOVE BIOGEL PI IND STRL 8 (GLOVE) ×3 IMPLANT
GLOVE BIOGEL PI INDICATOR 6.5 (GLOVE) ×1
GLOVE BIOGEL PI INDICATOR 8 (GLOVE) ×3
GLOVE ECLIPSE 6.5 STRL STRAW (GLOVE) ×2 IMPLANT
GLOVE ECLIPSE 8.0 STRL XLNG CF (GLOVE) ×2 IMPLANT
GOWN STRL NON-REIN LRG LVL3 (GOWN DISPOSABLE) ×4 IMPLANT
KIT BASIN OR (CUSTOM PROCEDURE TRAY) ×2 IMPLANT
KIT ROOM TURNOVER OR (KITS) ×2 IMPLANT
MARKER SKIN DUAL TIP RULER LAB (MISCELLANEOUS) ×2 IMPLANT
MESH HERNIA 3X6 (Mesh General) ×2 IMPLANT
NEEDLE HYPO 25GX1X1/2 BEV (NEEDLE) ×2 IMPLANT
NS IRRIG 1000ML POUR BTL (IV SOLUTION) ×2 IMPLANT
PACK SURGICAL SETUP 50X90 (CUSTOM PROCEDURE TRAY) ×2 IMPLANT
PAD ARMBOARD 7.5X6 YLW CONV (MISCELLANEOUS) ×2 IMPLANT
PENCIL BUTTON HOLSTER BLD 10FT (ELECTRODE) ×2 IMPLANT
SPECIMEN JAR SMALL (MISCELLANEOUS) IMPLANT
SPONGE GAUZE 4X4 12PLY (GAUZE/BANDAGES/DRESSINGS) ×2 IMPLANT
SPONGE LAP 18X18 X RAY DECT (DISPOSABLE) ×2 IMPLANT
STRIP CLOSURE SKIN 1/2X4 (GAUZE/BANDAGES/DRESSINGS) ×2 IMPLANT
SUT MON AB 4-0 PC3 18 (SUTURE) ×2 IMPLANT
SUT PROLENE 2 0 CT2 30 (SUTURE) ×4 IMPLANT
SUT SILK 2 0 SH (SUTURE) ×2 IMPLANT
SUT VIC AB 2-0 SH 18 (SUTURE) ×2 IMPLANT
SUT VIC AB 3-0 SH 27 (SUTURE) ×2
SUT VIC AB 3-0 SH 27XBRD (SUTURE) ×2 IMPLANT
SUT VICRYL AB 3 0 TIES (SUTURE) ×2 IMPLANT
SYR CONTROL 10ML LL (SYRINGE) ×2 IMPLANT
TOWEL OR 17X24 6PK STRL BLUE (TOWEL DISPOSABLE) ×2 IMPLANT
TOWEL OR 17X26 10 PK STRL BLUE (TOWEL DISPOSABLE) ×2 IMPLANT

## 2013-11-12 NOTE — Progress Notes (Signed)
Orthopedic Tech Progress Note Patient Details:  Jearld ShinesCharles B Dimitroff 09-12-1966 [id number]  Ortho Devices Type of Ortho Device: Abdominal binder Ortho Device/Splint Location: abdomen Ortho Device/Splint Interventions: Ordered Abdominal binder left in room with Jacqualine Maurn  Nygeria Lager 11/12/2013, 10:48 AM

## 2013-11-12 NOTE — Discharge Instructions (Addendum)
Please follow up with our [facility] Medicine Clinic for hospital follow up as schedule and please follow up with surgery in 2-3 weeks as listed below, they should contact you, but if not you can call them to make an appointment.  [facility] _______[facility]  INGUINAL HERNIA REPAIR: POST OP INSTRUCTIONS  Always review your discharge instruction sheet given to you by the facility where your surgery was performed. IF YOU HAVE DISABILITY OR FAMILY LEAVE FORMS, YOU MUST BRING THEM TO THE OFFICE FOR PROCESSING.   DO NOT GIVE THEM TO YOUR DOCTOR.  1. A  prescription for pain medication may be given to you upon discharge.  Take your pain medication as prescribed, if needed.  If narcotic pain medicine is not needed, then you may take acetaminophen (Tylenol) or ibuprofen (Advil) as needed. 2. Take your usually prescribed medications unless otherwise directed. 3. If you need a refill on your pain medication, please contact your pharmacy.  They will contact our office to request authorization. Prescriptions will not be filled after 5 pm or on week-ends. 4. You should follow a light diet the first 24 hours after arrival home, such as soup and crackers, etc.  Be sure to include lots of fluids daily.  Resume your normal diet the day after surgery. 5. Most patients will experience some swelling and bruising around the umbilicus or in the groin and scrotum.  Ice packs and reclining will help.  Swelling and bruising can take several days to resolve.  6. It is common to experience some constipation if taking pain medication after surgery.  Increasing fluid intake and taking a stool softener (such as Colace) will usually help or prevent this problem from occurring.  A mild laxative (Milk of Magnesia or Miralax) should be taken according to package directions if there are no bowel movements after 48 hours. 7. Unless discharge instructions indicate otherwise, you may remove your bandages 72 hours after surgery,  and you may shower at that time.  You may have steri-strips (small skin tapes) in place directly over the incision.  These strips should be left on the skin.  If your surgeon used skin glue on the incision, you may shower in 24 hours.  The glue will flake off over the next 2-3 weeks.  Any sutures or staples will be removed at the office during your follow-up visit. 8. ACTIVITIES:  You may resume regular (light) daily activities beginning the next day--such as daily self-care, walking, climbing stairs--gradually increasing activities as tolerated.  You may have sexual intercourse when it is comfortable.  Refrain from any heavy lifting or straining-nothing over 10 pounds for 2 weeks.  a. You may drive when you are no longer taking prescription pain medication, you can comfortably wear a seatbelt, and you can safely maneuver your car and apply brakes. b. RETURN TO WORK:  _Light work in 1-2 weeks.  Full duty in 6 weeks._________________________________________________________ 9. You should see your doctor in the office for a follow-up appointment approximately 2-3 weeks after your surgery.  Make sure that you call for this appointment within a day or two after you arrive home to insure a convenient appointment time. 10. OTHER INSTRUCTIONS:  __________________________________________________________________________________________________________________________________________________________________________________________  WHEN TO CALL YOUR DOCTOR: 1. Fever over 101.0 2. Inability to urinate 3. Nausea and/or vomiting 4. Extreme swelling or bruising 5. Continued bleeding from incision. 6. Increased pain, redness, or drainage from the incision  The clinic staff is available to answer your questions during regular business hours.  Please dont hesitate to call and ask to speak to one of the nurses for clinical concerns.  If you have a medical emergency, go to the nearest emergency room or call 911.  A  surgeon from [facility] is always on call at the hospital   [address], [address], [place], [place]  [address] ?  [address] [address], [place], [place]   [id number] [phone] ? [phone] ? FAX [phone] Web site: [email or web address]

## 2013-11-12 NOTE — Progress Notes (Signed)
  Date: 11/12/2013  Patient name: Darrell King  Medical record number: [id number]  Date of birth: [birth year]   This patient has been seen and the plan of care was discussed with the house staff. Please see their note for complete details. I concur with their findings with the following additions/corrections:  Surgery team will take patient to OR today.   Inez CatalinaEmily B Mullen, MD 11/12/2013, 1:40 PM

## 2013-11-12 NOTE — Progress Notes (Signed)
I saw and evaluated the patient.  I personally confirmed the key portions of the history and exam documented by Dr. Komanski and I reviewed pertinent patient test results.  The assessment, diagnosis, and plan were formulated together and I agree with the documentation in the resident's note.  

## 2013-11-12 NOTE — H&P (Signed)
  Date: 11/12/2013  Patient name: Darrell King  Medical record number: [id number]  Date of birth: 04-30-1966   I have seen and evaluated Darrell Shinesharles B Dubree and discussed their care with the Residency Team.   Briefly, Darrell King is a 48yo man with Lt inguinal hernia.  [facility] worsening pain over 2 weeks, throbbing in nature, unrelieved with ibuprofen.  He has a job in maintenance which requires heavy lifting.  He has a history of Rt inguinal hernia many years ago.  In clinic, the hernia was non reducible and painful to palpation.  However, by the time night team had seen him, it appears that the hernia had reduced.   Assessment and Plan: I have seen and evaluated the patient as outlined above. I agree with the formulated Assessment and Plan as detailed in the residents' admission note, with the following changes:   1. Lt inguinal hernia: Pain control with vicoden and prn morphine for severe pain.  Surgery consult, nausea medication. NPO if surgery possible to schedule while inpatient.  If surgery postponed to outpatient, Darrell King would likely not be able to work given his occupation.   2. H/O Asthma: Stable breathing at this time, albuterol as needed.   Other issues per resident note.   Inez CatalinaEmily B Rumi Taras, MD 1/15/20151:12 PM

## 2013-11-12 NOTE — Progress Notes (Signed)
I have seen and examined [name].  He had an transiently incarcerated left inguinal hernia that has reduced.  Plan left inguinal hernia repair with mesh. I have explained the procedure, risks, and aftercare of inguinal hernia repair.  Risks include but are not limited to bleeding, infection, wound problems, anesthesia, recurrence, bladder or intestine injury, urinary retention, testicular dysfunction, chronic pain, mesh problems.  He seems to understand and agrees to proceed.

## 2013-11-12 NOTE — Progress Notes (Signed)
Subjective:  - Patient does not have fever, chills, nausea or abdominal pain. He has tenderness over the left inguinal area. No bulge hernia when we saw pt in AM.   Objective: Vital signs in last 24 hours: Filed Vitals:   11/11/13 1751 11/11/13 2115 11/12/13 0603  BP: 141/87 145/84 113/76  Pulse: 71 72 69  Temp: 97.7 F (36.5 C) 98.2 F (36.8 C) 97.3 F (36.3 C)  TempSrc: Oral Oral Oral  Resp: 18 18 18   Height: 6\' 9"  (2.057 m)    Weight: 279 lb (126.554 kg)    SpO2: 100% 99% 98%   Weight change:   Intake/Output Summary (Last 24 hours) at 11/12/13 0931 Last data filed at 11/12/13 0600  Gross per 24 hour  Intake 916.67 ml  Output      0 ml  Net 916.67 ml   Physical Exam:   Filed Vitals:   11/11/13 1751 11/11/13 2115 11/12/13 0603  BP: 141/87 145/84 113/76  Pulse: 71 72 69  Temp: 97.7 F (36.5 C) 98.2 F (36.8 C) 97.3 F (36.3 C)  TempSrc: Oral Oral Oral  Resp: 18 18 18   Height: 6\' 9"  (2.057 m)    Weight: 279 lb (126.554 kg)    SpO2: 100% 99% 98%    General: Not in acute distress HEENT: PERRL, EOMI, no scleral icterus, No JVD or bruit Cardiac: S1/S2, RRR, No murmurs, gallops or rubs Pulm: Good air movement bilaterally. Clear to auscultation bilaterally. No rales, wheezing, rhonchi or rubs. Abd: Soft. Bowel sounds are normal. There is tenderness over the left inguinal area, not bulging hernia is present. No rebound abdominal pain, no organomegaly, BS present. Ext: No edema. 2+DP/PT pulse bilaterally Musculoskeletal: No joint deformities, erythema, or stiffness, ROM full Skin: No rashes.  Neuro: Alert and oriented X3, cranial nerves II-XII grossly intact, muscle strength 5/5 in all extremeties, sensation to light touch intact. Brachial reflex 2+ bilaterally. Knee reflex 1+ bilaterally. Negative Babinski's sign. Normal finger to nose test. Psych: Patient is not psychotic, no suicidal or hemocidal ideation.  Lab Results: Basic Metabolic Panel:  Recent Labs Lab  11/11/13 1900  NA 142  K 4.1  CL 101  CO2 27  GLUCOSE 81  BUN 13  CREATININE 1.09  CALCIUM 9.7   Liver Function Tests:  Recent Labs Lab 11/11/13 1900  AST 26  ALT 27  ALKPHOS 55  BILITOT 0.6  PROT 7.9  ALBUMIN 3.9   No results found for this basename: LIPASE, AMYLASE,  in the last 168 hours No results found for this basename: AMMONIA,  in the last 168 hours CBC:  Recent Labs Lab 11/11/13 1900  WBC 5.7  NEUTROABS 2.6  HGB 14.6  HCT 42.1  MCV 83.0  PLT 177   Cardiac Enzymes: No results found for this basename: CKTOTAL, CKMB, CKMBINDEX, TROPONINI,  in the last 168 hours BNP: No results found for this basename: PROBNP,  in the last 168 hours D-Dimer: No results found for this basename: DDIMER,  in the last 168 hours CBG: No results found for this basename: GLUCAP,  in the last 168 hours Hemoglobin A1C: No results found for this basename: HGBA1C,  in the last 168 hours Fasting Lipid Panel: No results found for this basename: CHOL, HDL, LDLCALC, TRIG, CHOLHDL, LDLDIRECT,  in the last 168 hours Thyroid Function Tests: No results found for this basename: TSH, T4TOTAL, FREET4, T3FREE, THYROIDAB,  in the last 168 hours Coagulation:  Recent Labs Lab 11/11/13 1900  LABPROT 12.8  INR 0.98   Anemia Panel: No results found for this basename: VITAMINB12, FOLATE, FERRITIN, TIBC, IRON, RETICCTPCT,  in the last 168 hours Urine Drug Screen: Drugs of Abuse  No results found for this basename: labopia, cocainscrnur, labbenz, amphetmu, thcu, labbarb    Alcohol Level: No results found for this basename: ETH,  in the last 168 hours Urinalysis: No results found for this basename: COLORURINE, APPERANCEUR, LABSPEC, PHURINE, GLUCOSEU, HGBUR, BILIRUBINUR, KETONESUR, PROTEINUR, UROBILINOGEN, NITRITE, LEUKOCYTESUR,  in the last 168 hours Misc. Labs:  Micro Results: No results found for this or any previous visit (from the past 240 hour(s)). Studies/Results: No results  found. Medications:  Scheduled Meds: . influenza vac split quadrivalent PF  0.5 mL Intramuscular Tomorrow-1000  . pneumococcal 23 valent vaccine  0.5 mL Intramuscular Tomorrow-1000   Continuous Infusions: . dextrose 5 % and 0.9% NaCl 100 mL/hr at 11/11/13 2350   PRN Meds:.acetaminophen, acetaminophen, albuterol, morphine injection, ondansetron (ZOFRAN) IV, ondansetron Assessment/Plan:  Pt is a 48 y.o. male with a PMH of right inguinal repair (19 yrs ago), anxiety, and a remote h/o asthma who was admitted for left inguinal hernia for the past 2 weeks. His hernia is reducible.    #Left inguinal hernia- Surgery consulted and initially felt it was not emergent secondary to the fact that it is reducible, but finally patient was taken to OR. Now pt is s/p of hernia repair. Patient is stable.   Plan: -appreciate surgery consult -pain control.  -zofran PRN nausea  #h/o asthma- Stable.  Not on rescue inhalers.  -albuterol nebs q6PRN          #VTE prophylaxis- SCDs   #Dispo- likely d/c home tomorrow   LOS: 1 day   Lorretta Harp 11/12/2013, 9:31 AM

## 2013-11-12 NOTE — Progress Notes (Signed)
Given the patients pain and concerns for returning to work quickly, I've talked with Dr. Abbey Chattersosenbower who is willing to operate on the patient today for a left inguinal hernia repair.  He is still NPO and not on any blood thinners and would like to proceed with surgery.  The patient will be taken down to Pre-op shortly.  IV antibiotics ordered and consent form signed.  His RN notified that the OR will be calling for him soon.

## 2013-11-12 NOTE — Progress Notes (Signed)
Subjective: Pt very frustrated, he wants to have surgery here and doesn't understand why he was admitted if what he needs is an elective procedure.  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  He doesn't want to be out of work for so long.  He understands that our schedule is very booked right now and we wouldn't be able to get to him in a timely fashion.  No N/V, tolerating diet.  Having flatus, no BM since admission.  Pain in left groin when hernia comes out and then some residual pain when it is reduced.  Objective: Vital signs in last 24 hours: Temp:  [97.1 F (36.2 C)-98.2 F (36.8 C)] 97.3 F (36.3 C) (01/15 0603) Pulse Rate:  [69-81] 69 (01/15 0603) Resp:  [18] 18 (01/15 0603) BP: (113-145)/(76-87) 113/76 mmHg (01/15 0603) SpO2:  [97 %-100 %] 98 % (01/15 0603) Weight:  [279 lb (126.554 kg)] 279 lb (126.554 kg) (01/14 1751) Last BM Date: 11/10/13  Intake/Output from previous day: 01/14 0701 - 01/15 0700 In: 916.7 [I.V.:616.7] Out: -  Intake/Output this shift:    PE: Gen:  Alert, NAD, pleasant Abd: Soft, moderately tender to palpation, ND, hernia sliding in and out in left groin, +BS   Lab Results:   Recent Labs  11/11/13 1900  WBC 5.7  HGB 14.6  HCT 42.1  PLT 177   BMET  Recent Labs  11/11/13 1900  NA 142  K 4.1  CL 101  CO2 27  GLUCOSE 81  BUN 13  CREATININE 1.09  CALCIUM 9.7   PT/INR  Recent Labs  11/11/13 1900  LABPROT 12.8  INR 0.98   CMP     Component Value Date/Time   NA 142 11/11/2013 1900   K 4.1 11/11/2013 1900   CL 101 11/11/2013 1900   CO2 27 11/11/2013 1900   GLUCOSE 81 11/11/2013 1900   BUN 13 11/11/2013 1900   CREATININE 1.09 11/11/2013 1900   CREATININE 1.10 10/30/2013 1138   CALCIUM 9.7 11/11/2013 1900   PROT 7.9 11/11/2013 1900   ALBUMIN 3.9 11/11/2013 1900   AST 26 11/11/2013 1900   ALT 27 11/11/2013 1900   ALKPHOS 55 11/11/2013 1900   BILITOT 0.6 11/11/2013  1900   GFRNONAA 80* 11/11/2013 1900   GFRAA >90 11/11/2013 1900   Lipase  No results found for this basename: lipase       Studies/Results: No results found.  Anti-infectives: Anti-infectives   None       Assessment/Plan Reducible left inguinal hernia 1.  Still in significant intermittent pain from hernia frequently popping in and out.  Not emergent or urgent surgical candidate.  OR schedule is heavily booked over the next few days and don't believe we would be able to get to him in a timely fashion.   Will check once more with Dr. Luisa Hart to see if he has any further recommendations, but he will likely need to be discharged to follow up with [place] in the office as an outpatient for elective hernia repair 2.  Can apply ice to help reduce inflammation 3.  Abdominal binder and compression underwear may help 4.  Follow up appointment on January 27th at 11:30am with Dr. Derrell Lolling, he should arrive 30 minutes prior to his appointment to fill out paperwork.  This is the earliest available appointment for any provider to discuss surgery.    LOS: 1 day  Darrell King, Darrell King 11/12/2013, 9:36 AM Pager: [phone]

## 2013-11-12 NOTE — Op Note (Signed)
Preoperative diagnosis:  Transiently incarcerated left inguinal hernia.  Postoperative diagnosis:  Same  Procedure:  Left inguinal hernia repair with mesh.  Surgeon:  Avel Peaceodd Shatisha Falter, M.D.  Anesthesia:  General with local (Marcaine).  Indication:  This is a 48 year old male admitted yesterday with an incarcerated left inguinal hernia that then spontaneously reduced.  He is brought to the operating room now for repair.  Technique:  He was seen in the holding room and the left groin was marked with my initials.  He was brought to the operating, placed supine on the operating table, and the anesthetic was administered by the anesthesiologist. The hair in the groin area was clipped as was felt to be necessary. This area was then sterilely prepped and draped.  Local anesthetic was infiltrated in the superficial and deep tissues in the left groin.  An incision was made through the skin and subcutaneous tissue until the external oblique aponeurosis was identified.  Local anesthetic was infiltrated deep to the external oblique aponeurosis. The external oblique aponeurosis was divided through the external ring medially and back toward the anterior superior iliac spine laterally. Using blunt dissection, the shelving edge of the inguinal ligament was identified inferiorly and the internal oblique aponeurosis and muscle were identified superiorly. The ilioinguinal nerve was identified and preserved.  The spermatic cord was isolated and a posterior window was made around it. An indirect hernia sac was identified and separated from the spermatic cord using blunt dissection. The hernia sac and its contents were reduced through the indirect hernia defect.   A piece of 3" x 6" polypropylene mesh was brought into the field and anchored 1-2 cm medial to the pubic tubercle with 2-0 Prolene suture. The inferior aspect of the mesh was anchored to the shelving edge of the inguinal ligament with running 2-0 Prolene suture  to a level 1-2 cm lateral to the internal ring. A slit was cut in the mesh creating 2 tails. These were wrapped around the spermatic cord. The superior aspect of the mesh was anchored to the internal oblique aponeurosis and muscle with interrupted 2-0 Vicryl sutures. The 2 tails of the mesh were then crossed creating a new internal ring and were anchored to the shelving edge of the inguinal ligament with 2-0 Prolene suture. The tip of a hemostat could be placed through the new aperture. The lateral aspect of the mesh was then tucked deep to the external oblique aponeurosis.  The wound was inspected and hemostasis was adequate. The external oblique aponeurosis was then closed over the mesh and cord with running 3-0 Vicryl suture. The subcutaneous tissue was closed with running 3-0 Vicryl suture. The skin closed with a running 4-0 Monocryl subcuticular stitch.  Steri-Strips and a sterile dressing were applied.  The procedure was well-tolerated without any apparent complications and the patient was taken to the recovery room in satisfactory condition.

## 2013-11-12 NOTE — Progress Notes (Signed)
Medical Student Daily Progress Note  48 year old man with a PMH of R inguinal hernia repair 19 years ago, anxiety, and remote hx of asthma who was admitted from clinic for worsening L inguinal pain and was found to have a L inguinal hernia.    Subjective: Surgery consulted and recommend outpatient follow up to schedule elective hernia repair. Patient continues to c/o of localized pain at hernia site but states hernia is still reducible. Patient is very frustrated with having to wait to have surgery.   Objective: Vital signs in last 24 hours: Filed Vitals:   11/11/13 1751 11/11/13 2115 11/12/13 0603  BP: 141/87 145/84 113/76  Pulse: 71 72 69  Temp: 97.7 F (36.5 C) 98.2 F (36.8 C) 97.3 F (36.3 C)  TempSrc: Oral Oral Oral  Resp: 18 18 18   Height: 6\' 9"  (2.057 m)    Weight: 126.554 kg (279 lb)    SpO2: 100% 99% 98%    Filed Weights   11/11/13 1751  Weight: 126.554 kg (279 lb)     Intake/Output Summary (Last 24 hours) at 11/12/13 1202 Last data filed at 11/12/13 0600  Gross per 24 hour  Intake 916.67 ml  Output      0 ml  Net 916.67 ml    Physical Exam: Gen: well-appearing, eager to leave hospital, in NAD CV: RRR, no m/r/g Pulm: CTAB, normal work of breathing Abd: NABS. Soft, nontender, nondistended. Moderate tenderness to palpation over L inguinal region, with reducible L inguinal hernia. R inguinal surgical scar present.   Lab Results: Results for orders placed during the hospital encounter of 11/11/13 (from the past 24 hour(s))  CBC WITH DIFFERENTIAL     Status: None   Collection Time    11/11/13  7:00 PM      Result Value Range   WBC 5.7  4.0 - 10.5 K/uL   RBC 5.07  4.22 - 5.81 MIL/uL   Hemoglobin 14.6  13.0 - 17.0 g/dL   HCT 40.942.1  81.139.0 - 91.452.0 %   MCV 83.0  78.0 - 100.0 fL   MCH 28.8  26.0 - 34.0 pg   MCHC 34.7  30.0 - 36.0 g/dL   RDW 78.213.7  95.611.5 - 21.315.5 %   Platelets 177  150 - 400 K/uL   Neutrophils Relative % 45  43 - 77 %   Neutro Abs 2.6  1.7 - 7.7  K/uL   Lymphocytes Relative 42  12 - 46 %   Lymphs Abs 2.4  0.7 - 4.0 K/uL   Monocytes Relative 8  3 - 12 %   Monocytes Absolute 0.4  0.1 - 1.0 K/uL   Eosinophils Relative 5  0 - 5 %   Eosinophils Absolute 0.3  0.0 - 0.7 K/uL   Basophils Relative 1  0 - 1 %   Basophils Absolute 0.0  0.0 - 0.1 K/uL  COMPREHENSIVE METABOLIC PANEL     Status: Abnormal   Collection Time    11/11/13  7:00 PM      Result Value Range   Sodium 142  137 - 147 mEq/L   Potassium 4.1  3.7 - 5.3 mEq/L   Chloride 101  96 - 112 mEq/L   CO2 27  19 - 32 mEq/L   Glucose, Bld 81  70 - 99 mg/dL   BUN 13  6 - 23 mg/dL   Creatinine, Ser 0.861.09  0.50 - 1.35 mg/dL   Calcium 9.7  8.4 - 57.810.5 mg/dL  Total Protein 7.9  6.0 - 8.3 g/dL   Albumin 3.9  3.5 - 5.2 g/dL   AST 26  0 - 37 U/L   ALT 27  0 - 53 U/L   Alkaline Phosphatase 55  39 - 117 U/L   Total Bilirubin 0.6  0.3 - 1.2 mg/dL   GFR calc non Af Amer 80 (*) >90 mL/min   GFR calc Af Amer >90  >90 mL/min  LACTIC ACID, PLASMA     Status: None   Collection Time    11/11/13  7:00 PM      Result Value Range   Lactic Acid, Venous 1.3  0.5 - 2.2 mmol/L  PROTIME-INR     Status: None   Collection Time    11/11/13  7:00 PM      Result Value Range   Prothrombin Time 12.8  11.6 - 15.2 seconds   INR 0.98  0.00 - 1.49  SURGICAL PCR SCREEN     Status: None   Collection Time    11/12/13  7:42 AM      Result Value Range   MRSA, PCR NEGATIVE  NEGATIVE   Staphylococcus aureus NEGATIVE  NEGATIVE    Micro Results: Recent Results (from the past 240 hour(s))  SURGICAL PCR SCREEN     Status: None   Collection Time    11/12/13  7:42 AM      Result Value Range Status   MRSA, PCR NEGATIVE  NEGATIVE Final   Staphylococcus aureus NEGATIVE  NEGATIVE Final   Comment:            The Xpert SA Assay (FDA     approved for NASAL specimens     in patients over 38 years of age),     is one component of     a comprehensive surveillance     program.  Test performance has     been  validated by [employer] for patients greater     than or equal to 82 year old.     It is not intended     to diagnose infection nor to     guide or monitor treatment.    Studies/Results: No results found.  Medications:  Scheduled Meds:  Continuous Infusions: . dextrose 5 % and 0.9% NaCl 100 mL/hr at 11/12/13 1053   PRN Meds:.acetaminophen, acetaminophen, albuterol, HYDROcodone-acetaminophen, morphine injection, ondansetron (ZOFRAN) IV, ondansetron  Assessment/Plan:  L inguinal hernia: surgery consulted and feel most appropriate management for this reducible hernia is to have pt follow up in surgery clinic and schedule as elective procedure. - appreciate surgery recs - hydrocodone/acetaminophen 5-325 mg po q4h PRN pain - ondansetron 4 mg po q6h PRN nausea - follow up with surgery scheduled for 1/27.  H/o asthma: stable, not on meds at home. - albuterol nebs q6h PRN  VTE ppx: SCDs  Dispo: home today   LOS: 1 day   This is a Psychologist, occupational Note.  The care of the patient was discussed with Dr. Clyde Lundborg and the assessment and plan formulated with their assistance.  Please see their attached note for official documentation of the daily encounter.  Esmeralda Malay 11/12/2013, 12:02 PM

## 2013-11-12 NOTE — Discharge Summary (Signed)
Please see the resident's discharge summary.

## 2013-11-12 NOTE — Anesthesia Postprocedure Evaluation (Signed)
  Anesthesia Post-op Note  Patient: Darrell King  Procedure(s) Performed: Procedure(s): HERNIA REPAIR INGUINAL INCARCERATED (Left)  Patient Location: PACU  Anesthesia Type:General  Level of Consciousness: awake  Airway and Oxygen Therapy: Patient Spontanous Breathing  Post-op Pain: mild  Post-op Assessment: Post-op Vital signs reviewed  Post-op Vital Signs: stable  Complications: No apparent anesthesia complications

## 2013-11-12 NOTE — Anesthesia Preprocedure Evaluation (Signed)
Anesthesia Evaluation  Patient identified by MRN, date of birth, ID band Patient awake    Reviewed: Allergy & Precautions, H&P , NPO status , [name] Chart, lab work & pertinent test results  Airway       Dental   Pulmonary asthma ,          Cardiovascular     Neuro/Psych Anxiety    GI/Hepatic   Endo/Other    Renal/GU      Musculoskeletal   Abdominal   Peds  Hematology  (+) Blood dyscrasia, ,   Anesthesia Other Findings   Reproductive/Obstetrics                           Anesthesia Physical Anesthesia Plan  ASA: II  Anesthesia Plan: General   Post-op Pain Management:    Induction: Intravenous  Airway Management Planned: Oral ETT  Additional Equipment:   Intra-op Plan:   Post-operative Plan: Extubation in OR  Informed Consent: I have reviewed the patients History and Physical, chart, labs and discussed the procedure including the risks, benefits and alternatives for the proposed anesthesia with the patient or authorized representative who has indicated his/her understanding and acceptance.     Plan Discussed with:   Anesthesia Plan Comments:         Anesthesia Quick Evaluation

## 2013-11-12 NOTE — Transfer of Care (Signed)
Immediate Anesthesia Transfer of Care Note  Patient: Darrell King  Procedure(s) Performed: Procedure(s): HERNIA REPAIR INGUINAL INCARCERATED (Left)  Patient Location: PACU  Anesthesia Type:General  Level of Consciousness: awake, alert , oriented and patient cooperative  Airway & Oxygen Therapy: Patient Spontanous Breathing  Post-op Assessment: Report given to PACU RN, Post -op Vital signs reviewed and stable and Patient moving all extremities X 4  Post vital signs: Reviewed and stable  Complications: No apparent anesthesia complications

## 2013-11-12 NOTE — Progress Notes (Signed)
I have seen the patient and reviewed the daily progress note by medical student  and discussed the care of the patient with them. Please see my note for documentation of my findings, assessment, and plans  Jahmere Bramel, MD PGY3, Internal Medicine Teaching Service Pager: [phone]   

## 2013-11-12 NOTE — Preoperative (Signed)
Beta Blockers   Reason not to administer Beta Blockers:Not Applicable 

## 2013-11-12 NOTE — Progress Notes (Signed)
Agree 

## 2013-11-13 ENCOUNTER — Encounter (HOSPITAL_COMMUNITY): Payer: Self-pay | Admitting: General Surgery

## 2013-11-13 MED ORDER — HYDROCODONE-ACETAMINOPHEN 5-325 MG PO TABS: 1.0000 | ORAL_TABLET | Freq: Three times a day (TID) | ORAL | Status: DC

## 2013-11-13 MED ORDER — DOCUSATE SODIUM 100 MG PO CAPS: 100.0000 mg | ORAL_CAPSULE | Freq: Every day | ORAL | Status: DC | PRN

## 2013-11-13 NOTE — Progress Notes (Signed)
Patient and wife given discharge instructions.  They verbalized understanding of all instructions and follow-up appointments.  Prescription given for pain med.  No other questions at this time.  Patient taken down for discharge home via wheelchair.

## 2013-11-13 NOTE — Progress Notes (Signed)
Medical Student Daily Progress Note  48 year old man with a PMH of R inguinal hernia repair 19 years ago, anxiety, and remote hx of asthma who was admitted from clinic for worsening L inguinal pain and is now POD1 from L inguinal hernia repair.   Subjective: [name] reports that he is feeling mostly well this morning, although he has a mild headache and 10/10 incision site pain that is slowly improving with hydrocodone. He has passed flatus and is urinating normally. He says he has a big appetite and had sausage and orange juice for breakfast but could eat more. He is interested in going home as soon as possible.   Objective: Vital signs in last 24 hours: Filed Vitals:   11/12/13 1740 11/12/13 2144 11/13/13 0149 11/13/13 0535  BP: 133/80 139/87 142/64 119/71  Pulse: 58 74 71 61  Temp: 98 F (36.7 C) 98 F (36.7 C) 97.8 F (36.6 C) 97.5 F (36.4 C)  TempSrc: Oral Oral Oral Oral  Resp: 16 18 20 20   Height:      Weight:      SpO2: 98% 96% 99% 95%     Weight change:    Intake/Output Summary (Last 24 hours) at 11/13/13 1014 Last data filed at 11/13/13 0525  Gross per 24 hour  Intake   2470 ml  Output    750 ml  Net   1720 ml    Physical Exam: Gen: well-appearing, eager to leave hospital, ambulating gingerly CV: RRR, no m/r/g  Pulm: CTAB, normal work of breathing  Abd: NABS. Soft, nontender, nondistended. Moderate tenderness to palpation over LLQ and L inguinal region, with dressing over incision clean and dry. R inguinal surgical scar present.   Lab Results: No results found for this or any previous visit (from the past 24 hour(s)).  Micro Results: Recent Results (from the past 240 hour(s))  SURGICAL PCR SCREEN     Status: None   Collection Time    11/12/13  7:42 AM      Result Value Range Status   MRSA, PCR NEGATIVE  NEGATIVE Final   Staphylococcus aureus NEGATIVE  NEGATIVE Final   Comment:            The Xpert SA Assay (FDA     approved for NASAL specimens     in patients over 47 years of age),     is one component of     a comprehensive surveillance     program.  Test performance has     been validated by [facility] for patients greater     than or equal to 47 year old.     It is not intended     to diagnose infection nor to     guide or monitor treatment.    Studies/Results: No results found.  Medications:  Scheduled Meds:  Continuous Infusions: . dextrose 5 % and 0.9% NaCl     PRN Meds:.acetaminophen, acetaminophen, albuterol, diphenhydrAMINE, HYDROcodone-acetaminophen, ondansetron (ZOFRAN) IV, ondansetron  Assessment/Plan:  L inguinal hernia: POD1 from hernia repair. Passing flatus, urinating normally, and pain controlled on oral hydrocodone. Per surgery, OK to go home today with instructions for light activity x 2 weeks.   - appreciate surgery recs  - hydrocodone/acetaminophen 5-325 mg 1-2 tabs po q4h PRN pain  - ondansetron 4 mg po q6h PRN nausea  - surgery will call patient with follow up appt.  H/o asthma: stable, not on meds at home.  -  albuterol nebs q6h PRN   VTE ppx: SCDs/ambulate  Dispo: home today    LOS: 2 days   This is a Psychologist, occupational Note.  The care of the patient was discussed with Dr. Virgina Organ and the assessment and plan formulated with their assistance.  Please see their attached note for official documentation of the daily encounter.  Darrell King 11/13/2013, 10:14 AM

## 2013-11-13 NOTE — Progress Notes (Signed)
Subjective: [name] was seen and examined at bedside this morning.  He is s/p L inguinal hernia repair yesterday evening. Reports 10/10 pain this morning expected to improve with pain medication. Afebrile. He works in Production [employer], theatre/television/film and was told he will likely have weight restrictions for work.   Objective: Vital signs in last 24 hours: Filed Vitals:   11/12/13 1740 11/12/13 2144 11/13/13 0149 11/13/13 0535  BP: 133/80 139/87 142/64 119/71  Pulse: 58 74 71 61  Temp: 98 F (36.7 C) 98 F (36.7 C) 97.8 F (36.6 C) 97.5 F (36.4 C)  TempSrc: Oral Oral Oral Oral  Resp: 16 18 20 20   Height:      Weight:      SpO2: 98% 96% 99% 95%   Weight change:   Intake/Output Summary (Last 24 hours) at 11/13/13 0852 Last data filed at 11/13/13 0525  Gross per 24 hour  Intake   2470 ml  Output    750 ml  Net   1720 ml   Physical Exam:   Filed Vitals:   11/12/13 1740 11/12/13 2144 11/13/13 0149 11/13/13 0535  BP: 133/80 139/87 142/64 119/71  Pulse: 58 74 71 61  Temp: 98 F (36.7 C) 98 F (36.7 C) 97.8 F (36.6 C) 97.5 F (36.4 C)  TempSrc: Oral Oral Oral Oral  Resp: 16 18 20 20   Height:      Weight:      SpO2: 98% 96% 99% 95%   General: lying in bed, painful distress with movement HEENT: PERRL, EOMI, no scleral icterus Cardiac: RRR Pulm: CTA B/L Abd: Soft. Bowel sounds are normal, distended, dressing C/D/I at site of L inguinal hernia repair, no visible surround erythema or edema, tenderness to palpation on lateral surface of dressing. BS present. Ext: No edema. 2+DP/PT pulse bilaterally, moving all 4 extremities Neuro: Alert and oriented X3, strength and sensation equal in b/l extremities.   Lab Results: Basic Metabolic Panel:  Recent Labs Lab 11/11/13 1900  NA 142  K 4.1  CL 101  CO2 27  GLUCOSE 81  BUN 13  CREATININE 1.09  CALCIUM 9.7   Liver Function Tests:  Recent Labs Lab 11/11/13 1900  AST 26  ALT 27  ALKPHOS 55  BILITOT 0.6  PROT 7.9  ALBUMIN 3.9    CBC:  Recent Labs Lab 11/11/13 1900  WBC 5.7  NEUTROABS 2.6  HGB 14.6  HCT 42.1  MCV 83.0  PLT 177   Coagulation:  Recent Labs Lab 11/11/13 1900  LABPROT 12.8  INR 0.98   Micro Results: Recent Results (from the past 240 hour(s))  SURGICAL PCR SCREEN     Status: None   Collection Time    11/12/13  7:42 AM      Result Value Range Status   MRSA, PCR NEGATIVE  NEGATIVE Final   Staphylococcus aureus NEGATIVE  NEGATIVE Final   Comment:            The Xpert SA Assay (FDA     approved for NASAL specimens     in patients over 41 years of age),     is one component of     a comprehensive surveillance     program.  Test performance has     been validated by [facility] for patients greater     than or equal to 6 year old.     It is not intended     to diagnose infection nor to  guide or monitor treatment.   Medications:  Scheduled Meds:   Continuous Infusions: . dextrose 5 % and 0.9% NaCl     PRN Meds:.acetaminophen, acetaminophen, albuterol, diphenhydrAMINE, HYDROcodone-acetaminophen, ondansetron (ZOFRAN) IV, ondansetron Assessment/Plan:  [name] is a 48 y.o. male with a PMH of right inguinal repair (19 yrs ago), anxiety, and a remote h/o asthma who was admitted for left inguinal hernia for the past 2 weeks.   s/p Left inguinal hernia repair 11/13/13 -appreciate surgery following, likely d/c home today? -pain control -zofran PRN nausea  VTE prophylaxis- SCDs  Dispo- ?d/c home today Diet: Regular   LOS: 2 days   Darden PalmerQureshi, Antionne Enrique 11/13/2013, 8:52 AM

## 2013-11-13 NOTE — Progress Notes (Signed)
  I have seen and examined the patient, and reviewed the daily progress note by Patriciaann ClanKathryn McKenney, MS 4 and discussed the care of the patient with them. Please see my progress note from 11/13/2013 for further details regarding assessment and plan.    Signed:  Darden PalmerSamaya Koya Hunger, MD 11/13/2013, 11:52 AM

## 2013-11-13 NOTE — Progress Notes (Signed)
Internal Medicine Attending  Date: 11/13/2013  Patient name: Darrell King Medical record number: [id number] Date of birth: [birth year] Age: 48 y.o. Gender: male  I saw and evaluated the patient on AM rounds with house staff. I reviewed the resident's note by Dr. Virgina OrganQureshi and I agree with the resident's findings and plans as documented in her note.

## 2013-11-13 NOTE — Progress Notes (Signed)
1 Day Post-Op  Subjective: Pt very happy, feels great.  No N/V, urinating well, having flatus.  Ambulating well.  Tolerating diet.  Looking  forward to going home today.  Objective: Vital signs in last 24 hours: Temp:  [97.5 F (36.4 C)-98.6 F (37 C)] 97.5 F (36.4 C) (01/16 0535) Pulse Rate:  [58-94] 61 (01/16 0535) Resp:  [10-21] 20 (01/16 0535) BP: (119-142)/(64-88) 119/71 mmHg (01/16 0535) SpO2:  [92 %-100 %] 95 % (01/16 0535) Last BM Date: 11/10/13  Intake/Output from previous day: 01/15 0701 - 01/16 0700 In: 2470 [P.O.:760; I.V.:1710] Out: 750 [Urine:700; Blood:50] Intake/Output this shift:    PE: Gen:  Alert, NAD, pleasant Abd: Soft, appropriately tenderness, ND, +BS, no HSM, incisions C/D/I, no significant testicular swelling   Lab Results:   Recent Labs  11/11/13 1900  WBC 5.7  HGB 14.6  HCT 42.1  PLT 177   BMET  Recent Labs  11/11/13 1900  NA 142  K 4.1  CL 101  CO2 27  GLUCOSE 81  BUN 13  CREATININE 1.09  CALCIUM 9.7   PT/INR  Recent Labs  11/11/13 1900  LABPROT 12.8  INR 0.98   CMP     Component Value Date/Time   NA 142 11/11/2013 1900   K 4.1 11/11/2013 1900   CL 101 11/11/2013 1900   CO2 27 11/11/2013 1900   GLUCOSE 81 11/11/2013 1900   BUN 13 11/11/2013 1900   CREATININE 1.09 11/11/2013 1900   CREATININE 1.10 10/30/2013 1138   CALCIUM 9.7 11/11/2013 1900   PROT 7.9 11/11/2013 1900   ALBUMIN 3.9 11/11/2013 1900   AST 26 11/11/2013 1900   ALT 27 11/11/2013 1900   ALKPHOS 55 11/11/2013 1900   BILITOT 0.6 11/11/2013 1900   GFRNONAA 80* 11/11/2013 1900   GFRAA >90 11/11/2013 1900   Lipase  No results found for this basename: lipase       Studies/Results: No results found.  Anti-infectives: Anti-infectives   Start     Dose/Rate Route Frequency Ordered Stop   11/13/13 0600  [MAR Hold]  ceFAZolin (ANCEF) IVPB 2 g/50 mL premix     (On MAR Hold since 11/12/13 1438)  Comments:  On call OR   2 g 100 mL/hr over 30 Minutes Intravenous  On call to O.R. 11/12/13 1416 11/12/13 1509       Assessment/Plan Transiently incarcerated left direct inguinal hernia POD #1 s/p repair with mesh  Plan: 1.  Okay for d/c today if tolerating regular diet 2.  F/u appt with Dr. Abbey Chattersosenbower in 2-3 weeks, office will call with appt time 3.  Keep tegaderm dressing in place until 11/15/13, shower with tegaderm dressing in place 4.  No need for antibiotics at discharge    LOS: 2 days    Aris GeorgiaDORT, Shanya Ferriss 11/13/2013, 10:33 AM Pager: [phone]

## 2013-11-13 NOTE — Progress Notes (Signed)
Can follow up with Rosenbower in 3 weeks.

## 2013-11-13 NOTE — Discharge Summary (Signed)
Name: Darrell King MRN: [id number] DOB: 10/27/1966 48 y.o. PCP: No Pcp Per Patient  Date of Admission: 11/11/2013  5:45 PM Date of Discharge: 11/13/2013 Attending Physician: Farley LyJerry Dale Joines, MD  Discharge Diagnosis: Principal Problem:   Left inguinal hernia  Discharge Medications:   Medication List    STOP taking these medications       ibuprofen 600 MG tablet  Commonly known as:  ADVIL,MOTRIN      TAKE these medications       docusate sodium 100 MG capsule  Commonly known as:  COLACE  Take 1 capsule (100 mg total) by mouth daily as needed for moderate constipation.     HYDROcodone-acetaminophen 5-325 MG per tablet  Commonly known as:  NORCO/VICODIN  Take 1-2 tablets by mouth every 8 (eight) hours.       Disposition and follow-up:   [name] [facility] St[facility].  At the hospital follow up visit please address:  1.  Left inguinal hernia--s/p repair with mesh 11/12/13. Needs to follow up with surgery as outpatient. Placed on lifting restrictions and slow to resume work.  Given PRN Norco on discharge. Tegaderm dressing will need to remain in place until 11/15/13.  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   2.  Labs / imaging needed at time of follow-up: none  3.  Pending labs/ test needing follow-up: none  Follow-up Appointments:     Follow-up Information   Follow up with Darrell King, Jacquelyn, MD On 11/23/2013. (10:15 am)    Specialty:  Internal Medicine   Contact information:   [address] [place] [address] [phone]       Call Adolph PollackOSENBOWER,TODD J, MD. (2-3 weeks appointment needed)    Specialty:  General Surgery   Contact information:   [address] [place] [address] [phone]      Discharge Instructions: Discharge Orders   Future Appointments Provider Department Dept Phone   11/23/2013 10:15 AM Darrell PeekJacquelyn Gill, MD [facility] [phone]   Future Orders Complete By Expires   Call MD for:  difficulty breathing, headache or visual disturbances  As directed    Call MD for:  persistant nausea and vomiting  As directed    Call MD for:  redness, tenderness, or signs of infection (pain, swelling, redness, odor or green/yellow discharge around incision site)  As directed    Call MD for:  severe uncontrolled pain  As directed    Call MD for:  temperature >100.4  As directed    Diet - low sodium heart healthy  As directed    Comments:     You should follow a light diet the first 24 hours after arrival home, such as soup and crackers, etc.  Be sure to include lots of fluids daily.  Resume your normal diet the day after surgery.   Increase activity slowly  As directed    Comments:     Per surgery recommendations   Lifting restrictions  As directed    Comments:     Refrain from any heavy lifting or straining-nothing over 10 pounds for 2 weeks.     Consultations: Treatment Team:  Md Montez Moritacs, MD  Procedures Performed:  Dg Chest 2 View  10/21/2013   CLINICAL DATA:  Cough and fever for 4 days.  EXAM: CHEST  2 VIEW  COMPARISON:  None.  FINDINGS: The lungs are well-aerated. Pulmonary  vascularity is at the upper limits of normal. There is no evidence of focal opacification, pleural effusion or pneumothorax.  The heart is normal in size; the mediastinal contour is within normal limits. No acute osseous abnormalities are seen.  IMPRESSION: No acute cardiopulmonary process seen.   Electronically Signed   By: Roanna Raider M.D.   On: 10/21/2013 04:06   Ct Head Wo Contrast  10/21/2013   CLINICAL DATA:  Headache and nausea. Fever. Focal posterior neck tenderness.  EXAM: CT HEAD WITHOUT CONTRAST  CT CERVICAL SPINE WITHOUT CONTRAST  TECHNIQUE: Multidetector CT imaging of the head and cervical spine was performed following the standard protocol without intravenous contrast. Multiplanar CT image reconstructions of  the cervical spine were also generated.  COMPARISON:  Cervical spine radiographs performed 06/02/2007  FINDINGS: CT HEAD FINDINGS  There is no evidence of acute infarction, mass lesion, or intra- or extra-axial hemorrhage on CT.  The posterior fossa, including the cerebellum, brainstem and fourth ventricle, is within normal limits. The third and lateral ventricles, and basal ganglia are unremarkable in appearance. The cerebral hemispheres are symmetric in appearance, with normal gray-white differentiation. No mass effect or midline shift is seen.  There is no evidence of fracture; visualized osseous structures are unremarkable in appearance. The orbits are within normal limits. The paranasal sinuses and mastoid air cells are well-aerated. No significant soft tissue abnormalities are seen.  CT CERVICAL SPINE FINDINGS  There is no evidence of fracture or subluxation. Vertebral bodies demonstrate normal height and alignment. Intervertebral disc spaces are preserved. Prevertebral soft tissues are within normal limits. The visualized neural foramina are grossly unremarkable. There is no CT evidence to suggest discitis.  The thyroid gland is unremarkable in appearance. The visualized lung apices are clear. No significant soft tissue abnormalities are seen.  IMPRESSION: 1. Unremarkable noncontrast CT of the head. 2. No evidence of fracture or subluxation along the cervical spine. No CT evidence for discitis.   Electronically Signed   By: Roanna Raider M.D.   On: 10/21/2013 04:18   Ct Cervical Spine Wo Contrast  10/21/2013   CLINICAL DATA:  Headache and nausea. Fever. Focal posterior neck tenderness.  EXAM: CT HEAD WITHOUT CONTRAST  CT CERVICAL SPINE WITHOUT CONTRAST  TECHNIQUE: Multidetector CT imaging of the head and cervical spine was performed following the standard protocol without intravenous contrast. Multiplanar CT image reconstructions of the cervical spine were also generated.  COMPARISON:  Cervical spine  radiographs performed 06/02/2007  FINDINGS: CT HEAD FINDINGS  There is no evidence of acute infarction, mass lesion, or intra- or extra-axial hemorrhage on CT.  The posterior fossa, including the cerebellum, brainstem and fourth ventricle, is within normal limits. The third and lateral ventricles, and basal ganglia are unremarkable in appearance. The cerebral hemispheres are symmetric in appearance, with normal gray-white differentiation. No mass effect or midline shift is seen.  There is no evidence of fracture; visualized osseous structures are unremarkable in appearance. The orbits are within normal limits. The paranasal sinuses and mastoid air cells are well-aerated. No significant soft tissue abnormalities are seen.  CT CERVICAL SPINE FINDINGS  There is no evidence of fracture or subluxation. Vertebral bodies demonstrate normal height and alignment. Intervertebral disc spaces are preserved. Prevertebral soft tissues are within normal limits. The visualized neural foramina are grossly unremarkable. There is no CT evidence to suggest discitis.  The thyroid gland is unremarkable in appearance. The visualized lung apices are clear. No significant soft tissue abnormalities are seen.  IMPRESSION: 1. Unremarkable  noncontrast CT of the head. 2. No evidence of fracture or subluxation along the cervical spine. No CT evidence for discitis.   Electronically Signed   By: Roanna Raider M.D.   On: 10/21/2013 04:18   Dg Chest Port 1 View  10/22/2013   CLINICAL DATA:  Short of breath.  EXAM: PORTABLE CHEST - 1 VIEW  COMPARISON:  10/21/2013  FINDINGS: The heart size and mediastinal contours are within normal limits. Both lungs are clear. The visualized skeletal structures are unremarkable.  IMPRESSION: No active disease.   Electronically Signed   By: Amie Portland M.D.   On: 10/22/2013 08:35   Dg Lumbar Puncture Fluoro Guide  10/21/2013   CLINICAL DATA:  Severe headache and neck pain and fever.  EXAM: DIAGNOSTIC LUMBAR  PUNCTURE UNDER FLUOROSCOPIC GUIDANCE  FLUOROSCOPY TIME:  0 min 14 seconds  PROCEDURE: Informed consent was obtained from the patient prior to the procedure, including potential complications of headache, allergy, and pain. With the patient prone, the lower back was prepped with Betadine. 1% Lidocaine was used for local anesthesia. Lumbar puncture was performed at the L3-4 level using a gauge needle with return of clear colorless CSF with an opening pressure of 20 cm water. 28ml of CSF were obtained for laboratory studies. The patient tolerated the procedure well and there were no apparent complications. Closing pressure was 15 cm of water.  IMPRESSION: Lumbar puncture performed without difficulty or immediate complication.   Electronically Signed   By: Geanie Cooley M.D.   On: 10/21/2013 12:05   Admission HPI: Pt is a 48 y.o. male with a PMH of right inguinal repair (19 yrs ago), anxiety, and a remote h/o asthma who is a direct admit to the clinic for worsening left inguinal pain for the past 2 weeks. He was recently hospitalized for aseptic meningitis and after coughing he was in extreme pain and noticed a bulge in his left inguinal area that was reducible. He states the pain is constant, throbbing, 8/10, and unrelieved by ibuprofen which he has used at home. He reports a mild residual HA from meningitis, but denies fever/chills, URI symptoms, CP, SOB, N/V/D/C, or urinary symptoms. He is eating and drinking normally and has a good appetite. He reports recently observing blood in his stool which will be followed up with colonoscopy. 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   Hospital Course by problem list: Principal Problem:   Left inguinal hernia   L inguinal hernia s/p repair--initial concern for incarceration but reducible by the time of admission.  Evaluated by surgery, initially were going to do surgery as outpatient but able to do L hernia repair with mesh on 11/12/13.   Mr. Creasy doing well post op, pain controlled with PRN Norco, tolerating regular diet, and passing gas.  He will need to follow up with surgery as an outpatient in 2-3 weeks with Dr. Abbey Chatters and will also need PCP follow up.  Tegaderm dressing will need to remain in place until 11/15/13.  He is recommended to refrain from heavy lifting or straining over 10 pounds for 2 weeks and light work for 1-2 weeks and return to full duty in 6 weeks.    Discharge Vitals:   BP 119/71  Pulse 61  Temp(Src) 97.5 F (36.4 C) (Oral)  Resp 20  Ht 6\' 9"  (2.057 m)  Wt 279 lb (126.554 kg)  BMI 29.91 kg/m2  SpO2 95%  Discharge Labs:  Basic Metabolic Panel:  Recent Labs  11/11/13 1900  NA 142  K 4.1  CL 101  CO2 27  GLUCOSE 81  BUN 13  CREATININE 1.09  CALCIUM 9.7   Liver Function Tests:  Recent Labs  11/11/13 1900  AST 26  ALT 27  ALKPHOS 55  BILITOT 0.6  PROT 7.9  ALBUMIN 3.9   CBC:  Recent Labs  11/11/13 1900  WBC 5.7  NEUTROABS 2.6  HGB 14.6  HCT 42.1  MCV 83.0  PLT 177    Recent Labs  11/11/13 1900  LABPROT 12.8  INR 0.98   Signed: Darden Palmer, MD 11/13/2013, 1:33 PM   Time Spent on Discharge: 35 minutes Services Ordered on Discharge: none Equipment Ordered on Discharge: none

## 2013-11-16 ENCOUNTER — Encounter: Payer: Self-pay | Admitting: Physician Assistant

## 2013-11-18 ENCOUNTER — Telehealth (INDEPENDENT_AMBULATORY_CARE_PROVIDER_SITE_OTHER): Payer: Self-pay

## 2013-11-18 NOTE — Telephone Encounter (Signed)
Pt notified of his post op appt with Dr. Abbey Chattersosenbower on 11/30/13 at 10:40 a.m.  Pt informed me that his employer had "let him go" when he told them he would need to be out of work for 4-6 weeks.  He asked if Dr. Abbey Chattersosenbower would write a letter on his behalf.  I suggested he contact his previous employer and ask them if a letter from his physician would help his situation.  If so, we would be glad to write the letter.

## 2013-11-23 ENCOUNTER — Ambulatory Visit (INDEPENDENT_AMBULATORY_CARE_PROVIDER_SITE_OTHER): Payer: BC Managed Care – PPO | Admitting: Internal Medicine

## 2013-11-23 ENCOUNTER — Encounter: Payer: Self-pay | Admitting: Internal Medicine

## 2013-11-23 VITALS — BP 141/84 | HR 84 | Temp 97.5°F | Ht >= 80 in | Wt 272.0 lb

## 2013-11-23 DIAGNOSIS — K4090 Unilateral inguinal hernia, without obstruction or gangrene, not specified as recurrent: Secondary | ICD-10-CM

## 2013-11-23 DIAGNOSIS — K921 Melena: Secondary | ICD-10-CM

## 2013-11-23 DIAGNOSIS — Z0000 Encounter for general adult medical examination without abnormal findings: Secondary | ICD-10-CM

## 2013-11-23 MED ORDER — HYDROCODONE-ACETAMINOPHEN 5-325 MG PO TABS: 1.5000 | ORAL_TABLET | Freq: Three times a day (TID) | ORAL | Status: DC | PRN

## 2013-11-23 NOTE — Progress Notes (Signed)
Subjective:   Patient ID: Darrell King male    DOB: 02-Sep-1966 48 y.o.    MRN: [id number]  ____________________________________  HPI: Darrell King is a [age] y.o. male here for a hospital follow-up after left inguinal hernia repair.  Pt has a PMH outlined below.  Please see problem-based assessment and plan for further details of medical issues addressed at today's visit.   PMH: Past Medical History  Diagnosis Date  . Asthma   . Refusal of blood transfusions as patient is Jehovah's Witness   . Anxiety     "had taken Paxil; back in 1996; nothing since" (10/21/2013)    Medications: Current Outpatient Prescriptions on File Prior to Visit  Medication Sig Dispense Refill  . docusate sodium (COLACE) 100 MG capsule Take 1 capsule (100 mg total) by mouth daily as needed for moderate constipation.  14 capsule  0  . HYDROcodone-acetaminophen (NORCO/VICODIN) 5-325 MG per tablet Take 1-2 tablets by mouth every 8 (eight) hours.  65 tablet  0   No current facility-administered medications on file prior to visit.    Allergies: No Known Allergies  FH: Family History  Problem Relation Age of Onset  . Cancer Mother     SH: History   Social History  . Marital Status: Married    Spouse Name: N/A    Number of Children: N/A  . Years of Education: N/A   Social History Main Topics  . Smoking status: Never Smoker   . Smokeless tobacco: Never Used  . Alcohol Use: 0.6 oz/week    1 Shots of liquor per week  . Drug Use: No  . Sexual Activity: Yes   Other Topics Concern  . None   Social History Narrative  . None    Review of Systems: Constitutional: Denies fever/chills, unintentional weight loss, or fatigue.  Respiratory: Denies SOB (at rest or with exertion), cough, chest tightness, and wheezing.   Cardiovascular: Denies CP, palpitations and leg swelling.  Gastrointestinal: Denies N/V/D/C, abdominal pain, and +blood/changes in stool. Genitourinary: Denies dysuria,  urgency, frequency, hematuria, and difficulty urinating.  Endocrine: Denies: hot/cold intolerance, polyuria, polyphagia, polydipsia. Skin: Denies rashes or wounds.  Neurological: Denies dizziness/light-headedness, syncope, numbness and headaches.  Hematological: Denies adenopathy or easy bruising. Psychiatric/Behavioral: Denies mood changes, sleep disturbance.   Objective:   Vital Signs: Filed Vitals:   11/23/13 1053  BP: 141/84  Pulse: 84  Temp: 97.5 F (36.4 C)  TempSrc: Oral  Height: 6\' 9"  (2.057 m)  Weight: 272 lb (123.378 kg)  SpO2: 98%      BP Readings from Last 3 Encounters:  11/23/13 141/84  11/13/13 120/76  11/13/13 120/76    Physical Exam: Constitutional: Vital signs reviewed.  Patient is well-developed and well-nourished in NAD and cooperative with exam.  Head: Normocephalic and atraumatic. Eyes: PERRL, EOMI, conjunctivae nl, no scleral icterus.  Neck: Supple, Trachea midline, no JVD appreciated. Cardiovascular: RRR, no MRG, pulses symmetric and intact b/l. Pulmonary/Chest: normal respiratory effort, non-tender to palpation, CTAB, no wheezes, rales, or rhonchi. Abdominal: Soft. ND +BS; left inguinal hernia repair wound is dry, without erythema or warmth.  Steri strips observed.  Neurological: A&O x3, cranial nerves II-XII are grossly intact, moving all extremities. Extremities: 2+DP b/l; no pitting edema. Skin: Warm, dry and intact. No rash, cyanosis, or clubbing.  Psychiatric: Normal mood.   Most Recent Laboratory Results:  CMP     Component Value Date/Time   NA 142 11/11/2013 1900   K 4.1 11/11/2013 1900  CL 101 11/11/2013 1900   CO2 27 11/11/2013 1900   GLUCOSE 81 11/11/2013 1900   BUN 13 11/11/2013 1900   CREATININE 1.09 11/11/2013 1900   CREATININE 1.10 10/30/2013 1138   CALCIUM 9.7 11/11/2013 1900   PROT 7.9 11/11/2013 1900   ALBUMIN 3.9 11/11/2013 1900   AST 26 11/11/2013 1900   ALT 27 11/11/2013 1900   ALKPHOS 55 11/11/2013 1900   BILITOT 0.6  11/11/2013 1900   GFRNONAA 80* 11/11/2013 1900   GFRAA >90 11/11/2013 1900    CBC    Component Value Date/Time   WBC 5.7 11/11/2013 1900   WBC 7.9 10/20/2013 1826   RBC 5.07 11/11/2013 1900   RBC 5.30 10/20/2013 1826   HGB 14.6 11/11/2013 1900   HGB 15.1 10/20/2013 1826   HCT 42.1 11/11/2013 1900   HCT 48.2 10/20/2013 1826   PLT 177 11/11/2013 1900   MCV 83.0 11/11/2013 1900   MCV 91.0 10/20/2013 1826   MCH 28.8 11/11/2013 1900   MCH 28.5 10/20/2013 1826   MCHC 34.7 11/11/2013 1900   MCHC 31.3* 10/20/2013 1826   RDW 13.7 11/11/2013 1900   LYMPHSABS 2.4 11/11/2013 1900   MONOABS 0.4 11/11/2013 1900   EOSABS 0.3 11/11/2013 1900   BASOSABS 0.0 11/11/2013 1900    Lipid Panel No results found for this basename: CHOL, HDL, LDLCALC, LDLDIRECT, TRIG, CHOLHDL    HA1C No results found for this basename: HGBA1C    Urinalysis No results found for this basename: colorurine, appearanceur, labspec, phurine, glucoseu, hgbur, bilirubinur, ketonesur, proteinur, urobilinogen, nitrite, leukocytesur    Urine Microalbumin No results found for this basename: MICROALBUR, [id number]    Imaging N/A   Assessment & Plan:   Assessment and plan was discussed and formulated with my attending.

## 2013-11-23 NOTE — Patient Instructions (Signed)
Thank you for your visit today. Please return to the internal medicine clinic in 3 months or sooner if needed.    Your current medical regimen is effective;  continue present plan and all medications. If you believe that you are suffering from a life threatening condition or one that may result in the loss of limb or function, then you should call 911 or proceed to the nearest Emergency Department.

## 2013-11-24 ENCOUNTER — Encounter (INDEPENDENT_AMBULATORY_CARE_PROVIDER_SITE_OTHER): Payer: Self-pay | Admitting: General Surgery

## 2013-11-24 DIAGNOSIS — Z0000 Encounter for general adult medical examination without abnormal findings: Secondary | ICD-10-CM | POA: Insufficient documentation

## 2013-11-24 NOTE — Assessment & Plan Note (Addendum)
Pt s/p left inguinal repair by Dr. Zoila Shutterosenberg.  On exam, wound is clean and dry with no signs of infection.  Pt is with minimal pain when taking vicodin-he is taking 1.5 pills every 6-8hrs.   -f/u with Dr. Zoila Shutterosenberg -refilled vicodin

## 2013-11-24 NOTE — Assessment & Plan Note (Signed)
Pt noted bright red blood that coated his stool on a couple of occasions without associated pain.  He has a h/o constipation.  His mother had colon cancer but she is in her mid-seventies.  There is no history of familial early onset colon cancer.  He does not have melena.  DDx includes hemorrhoids (most likely given BRB that coats the stool during defecation), rectal ulcer, fissure (not likely without pain), polyps, cancer (highly unlikely given age).   -defer colonoscopy -continue to monitor for additional bleeding

## 2013-11-24 NOTE — Assessment & Plan Note (Addendum)
-  continue to monitor for elevated BP -recommended exercise  Wt Readings from Last 3 Encounters:  11/23/13 272 lb (123.378 kg)  11/11/13 279 lb (126.554 kg)  11/11/13 279 lb (126.554 kg)   Temp Readings from Last 3 Encounters:  11/23/13 97.5 F (36.4 C) Oral  11/13/13 97.8 F (36.6 C) Oral  11/13/13 97.8 F (36.6 C) Oral   BP Readings from Last 3 Encounters:  11/23/13 141/84  11/13/13 120/76  11/13/13 120/76   Pulse Readings from Last 3 Encounters:  11/23/13 84  11/13/13 74  11/13/13 74

## 2013-11-25 ENCOUNTER — Encounter: Payer: Self-pay | Admitting: Internal Medicine

## 2013-11-25 NOTE — Progress Notes (Signed)
Case discussed with Dr. Delane GingerGill at the time of the visit.  We reviewed the resident's history and exam and pertinent patient test results.  I agree with the assessment, diagnosis and plan of care documented in the resident's note.  If the rectal bleeding persists he will require a more formal physical examination of the anus/rectum and likely require referral for a colonoscopy given his symptoms and family history.

## 2013-11-30 ENCOUNTER — Encounter: Payer: Self-pay | Admitting: *Deleted

## 2013-11-30 ENCOUNTER — Encounter (INDEPENDENT_AMBULATORY_CARE_PROVIDER_SITE_OTHER): Payer: Self-pay

## 2013-11-30 ENCOUNTER — Ambulatory Visit (INDEPENDENT_AMBULATORY_CARE_PROVIDER_SITE_OTHER): Payer: BC Managed Care – PPO | Admitting: General Surgery

## 2013-11-30 ENCOUNTER — Encounter (INDEPENDENT_AMBULATORY_CARE_PROVIDER_SITE_OTHER): Payer: Self-pay | Admitting: General Surgery

## 2013-11-30 VITALS — BP 148/98 | HR 76 | Temp 98.1°F | Resp 14 | Ht >= 80 in | Wt 276.0 lb

## 2013-11-30 DIAGNOSIS — Z4889 Encounter for other specified surgical aftercare: Secondary | ICD-10-CM

## 2013-11-30 NOTE — Progress Notes (Signed)
He presents for postop followup after open left inguinal hernia repair with mesh.  Post op pain is improving.  No difficulty voiding or having BMs.  Swelling is decreasing.  He tells me he was released by one of his jobs because they had no light duty for him until his return to full duty in 6 weeks.  P.E.  GU:  Left groin incision clean/dry/intact, swelling is minimal, repair is solid.  Assessment:  Doing well post hernia repair.  Plan:  Continue light activities for 6 weeks postop then slowly start to resume normal activities as tolerated which we discussed.  May return to light duty work now.  Return prn.

## 2013-11-30 NOTE — Patient Instructions (Signed)
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 May return to full activities as tolerated 12/24/2013.

## 2013-12-03 ENCOUNTER — Encounter: Payer: Self-pay | Admitting: Gastroenterology

## 2013-12-16 NOTE — Discharge Summary (Signed)
Please see my discharge summary.   Signed: Darden PalmerSamaya Shailey Butterbaugh, MD PGY-2, Internal Medicine Resident Pager: [phone]  12/16/2013,1:38 PM

## 2014-01-04 ENCOUNTER — Encounter: Payer: Self-pay | Admitting: Gastroenterology

## 2014-01-04 ENCOUNTER — Ambulatory Visit (INDEPENDENT_AMBULATORY_CARE_PROVIDER_SITE_OTHER): Payer: No Typology Code available for payment source | Admitting: Gastroenterology

## 2014-01-04 VITALS — BP 110/80 | HR 72 | Ht >= 80 in | Wt 288.0 lb

## 2014-01-04 DIAGNOSIS — Z800 Family history of malignant neoplasm of digestive organs: Secondary | ICD-10-CM

## 2014-01-04 DIAGNOSIS — Z789 Other specified health status: Secondary | ICD-10-CM | POA: Insufficient documentation

## 2014-01-04 DIAGNOSIS — Z1211 Encounter for screening for malignant neoplasm of colon: Secondary | ICD-10-CM

## 2014-01-04 DIAGNOSIS — IMO0001 Reserved for inherently not codable concepts without codable children: Secondary | ICD-10-CM | POA: Insufficient documentation

## 2014-01-04 MED ORDER — MOVIPREP 100 G PO SOLR: 1.0000 | Freq: Once | ORAL | Status: DC

## 2014-01-04 NOTE — Progress Notes (Signed)
HPI: This is a  very pleasant 48 year old man whom I am meeting for the first time today.  Overt red rectal bleeding in early January. On TP and dripping. No constipatoin, no anal pains. Has felt hemorrhoidal bumps.  His mother was diagnosed in last 1-2 years, with colon cancer, stage IV at diagnosis. She is in her 2370s now. His sister had tumors "all over her body, but not colon cancer"  Never had colonoscopy.     Review of systems: Pertinent positive and negative review of systems were noted in the above HPI section. Complete review of systems was performed and was otherwise normal.    Past Medical History  Diagnosis Date  . Asthma   . Refusal of blood transfusions as patient is Jehovah's Witness   . Anxiety     "had taken Paxil; back in 1996; nothing since" (10/21/2013)    Past Surgical History  Procedure Laterality Date  . Inguinal hernia repair Right 1995  . Inguinal hernia repair Left 11/12/2013    Procedure: HERNIA REPAIR INGUINAL INCARCERATED;  Surgeon: Adolph Pollackodd J Rosenbower, MD;  [facility];  Service: General;  Laterality: Left;    No current outpatient prescriptions on file.   No current facility-administered medications for this visit.    Allergies as of 01/04/2014  . (No Known Allergies)    Family History  Problem Relation Age of Onset  . Colon cancer Mother     colon  . Cancer Sister     dont know what type    History   Social History  . Marital Status: Married    Spouse Name: N/A    Number of Children: 3  . Years of Education: N/A   Occupational History  . Maintenance Tech    Social History Main Topics  . Smoking status: Never Smoker   . Smokeless tobacco: Never Used  . Alcohol Use: 0.0 oz/week    1-2 Shots of liquor per week     Comment: month  . Drug Use: No  . Sexual Activity: Yes   Other Topics Concern  . Not on file   Social History Narrative  . No narrative on file       Physical Exam: BP 110/80  Pulse 72  Ht 6\' 9"   (2.057 m)  Wt 288 lb (130.636 kg)  BMI 30.87 kg/m2 Constitutional: generally well-appearing Psychiatric: alert and oriented x3 Eyes: extraocular movements intact Mouth: oral pharynx moist, no lesions Neck: supple no lymphadenopathy Cardiovascular: heart regular rate and rhythm Lungs: clear to auscultation bilaterally Abdomen: soft, nontender, nondistended, no obvious ascites, no peritoneal signs, normal bowel sounds Extremities: no lower extremity edema bilaterally Skin: no lesions on visible extremities Rectal examination deferred for upcoming colonoscopy   Assessment and plan: 48 y.o. male with  elevated risk for colon cancer with family history of colon cancer, single episode of minor anorectal bleeding  First is minor anorectal bleeding is likely hemorrhoidal. He has not had colon cancer screening and should have it given his family history of colon cancer with his mom diagnosed with extensive stage IV cancer in her early 6370s. We'll proceed with colonoscopy at his soonest convenience. He is a TEFL teacherJehovah's Witness and we spoke at length about the risk of bleeding. Risk is minor but not in significant and he understands that there could be a need or situation arise for which blood transfusions would be recommended. He understands that and knows he will decline if the case arises. He also understands there are risks  of missing a cancer, perforation.

## 2014-01-04 NOTE — Patient Instructions (Signed)
You will be set up for a colonoscopy for FH of colon cancer, high risk screening. Your rectal bleeding was likely hemorrhoidal.

## 2014-01-12 ENCOUNTER — Encounter: Payer: Self-pay | Admitting: Gastroenterology

## 2014-02-02 ENCOUNTER — Other Ambulatory Visit: Payer: Self-pay | Admitting: Internal Medicine

## 2014-02-02 NOTE — Addendum Note (Signed)
Addended by: Yolanda MangesWILSON, Sargent Mankey M on: 02/02/2014 08:44 PM   Modules accepted: Orders

## 2014-02-10 ENCOUNTER — Encounter: Payer: No Typology Code available for payment source | Admitting: Gastroenterology

## 2014-02-10 NOTE — Addendum Note (Signed)
Addended by: Neomia DearPOWERS, Maureena Dabbs E on: 02/10/2014 03:44 PM   Modules accepted: Orders

## 2014-11-16 ENCOUNTER — Emergency Department (HOSPITAL_COMMUNITY): Payer: 59

## 2014-11-16 ENCOUNTER — Telehealth: Payer: Self-pay | Admitting: *Deleted

## 2014-11-16 ENCOUNTER — Emergency Department (HOSPITAL_COMMUNITY)
Admission: EM | Admit: 2014-11-16 | Discharge: 2014-11-16 | Disposition: A | Discharge status: Home / Self Care | Payer: 59 | Attending: Emergency Medicine | Admitting: Emergency Medicine

## 2014-11-16 ENCOUNTER — Encounter (HOSPITAL_COMMUNITY): Payer: Self-pay | Admitting: Neurology

## 2014-11-16 DIAGNOSIS — M79601 Pain in right arm: Secondary | ICD-10-CM | POA: Insufficient documentation

## 2014-11-16 DIAGNOSIS — R079 Chest pain, unspecified: Secondary | ICD-10-CM | POA: Diagnosis present

## 2014-11-16 DIAGNOSIS — R0789 Other chest pain: Secondary | ICD-10-CM | POA: Insufficient documentation

## 2014-11-16 DIAGNOSIS — R531 Weakness: Secondary | ICD-10-CM | POA: Diagnosis not present

## 2014-11-16 DIAGNOSIS — R202 Paresthesia of skin: Secondary | ICD-10-CM | POA: Diagnosis not present

## 2014-11-16 DIAGNOSIS — Z8659 Personal history of other mental and behavioral disorders: Secondary | ICD-10-CM | POA: Insufficient documentation

## 2014-11-16 DIAGNOSIS — J45909 Unspecified asthma, uncomplicated: Secondary | ICD-10-CM | POA: Insufficient documentation

## 2014-11-16 LAB — BASIC METABOLIC PANEL
Anion gap: 5 (ref 5–15)
BUN: 12 mg/dL (ref 6–23)
CALCIUM: 9.7 mg/dL (ref 8.4–10.5)
CHLORIDE: 107 meq/L (ref 96–112)
CO2: 28 mmol/L (ref 19–32)
CREATININE: 1.13 mg/dL (ref 0.50–1.35)
GFR calc Af Amer: 88 mL/min — ABNORMAL LOW (ref >90)
GFR, EST NON AFRICAN AMERICAN: 76 mL/min — AB (ref >90)
Glucose, Bld: 96 mg/dL (ref 70–99)
POTASSIUM: 4.5 mmol/L (ref 3.5–5.1)
Sodium: 140 mmol/L (ref 135–145)

## 2014-11-16 LAB — CBC
HCT: 46.2 % (ref 39.0–52.0)
HEMOGLOBIN: 15.9 g/dL (ref 13.0–17.0)
MCH: 28.8 pg (ref 26.0–34.0)
MCHC: 34.4 g/dL (ref 30.0–36.0)
MCV: 83.5 fL (ref 78.0–100.0)
Platelets: 196 10*3/uL (ref 150–400)
RBC: 5.53 MIL/uL (ref 4.22–5.81)
RDW: 13.9 % (ref 11.5–15.5)
WBC: 4.9 10*3/uL (ref 4.0–10.5)

## 2014-11-16 LAB — I-STAT TROPONIN, ED: TROPONIN I, POC: 0 ng/mL (ref 0.00–0.08)

## 2014-11-16 MED ORDER — PREDNISONE 20 MG PO TABS: 20.0000 mg | ORAL_TABLET | Freq: Two times a day (BID) | ORAL | Status: DC

## 2014-11-16 MED ORDER — HYDROCODONE-ACETAMINOPHEN 5-325 MG PO TABS: 1.0000 | ORAL_TABLET | Freq: Four times a day (QID) | ORAL | Status: DC | PRN

## 2014-11-16 MED ORDER — HYDROCODONE-ACETAMINOPHEN 5-325 MG PO TABS
2.0000 | ORAL_TABLET | Freq: Once | ORAL | Status: AC
  Administered 2014-11-16: 2 via ORAL
  Filled 2014-11-16: qty 2

## 2014-11-16 NOTE — ED Notes (Signed)
States, "rt. Hand swelling."

## 2014-11-16 NOTE — Telephone Encounter (Signed)
Pt's wife calls and then he takes phone and speaks stating that for 2 weeks appr he has chest pain radiating down his arm at intervals, states the only time it changes is when he goes to bed or lays down then it becomes much worse, denies shortness of breath, n&v, h/a, vision changes, loss of consciousness, weakness, dizziness. He is having this pain this am and is advised to have his wife drive him to [place] or call 911, they are agreeable

## 2014-11-16 NOTE — ED Notes (Signed)
Pt reports right sided cp for 2 weeks with radiation into right arm. Pain got worse this morning while lying down. Reports sob, denies n/v. Pt is a x 4. Skin warm and dry.

## 2014-11-16 NOTE — Discharge Instructions (Signed)
Cervical Radiculopathy °Cervical radiculopathy happens when a nerve in the neck is pinched or bruised by a slipped (herniated) disk or by arthritic changes in the bones of the cervical spine. This can occur due to an injury or as part of the normal aging process. Pressure on the cervical nerves can cause pain or numbness that runs from your neck all the way down into your arm and fingers. °CAUSES  °There are many possible causes, including: °· Injury. °· Muscle tightness in the neck from overuse. °· Swollen, painful joints (arthritis). °· Breakdown or degeneration in the bones and joints of the spine (spondylosis) due to aging. °· Bone spurs that may develop near the cervical nerves. °SYMPTOMS  °Symptoms include pain, weakness, or numbness in the affected arm and hand. Pain can be severe or irritating. Symptoms may be worse when extending or turning the neck. °DIAGNOSIS  °Your caregiver will ask about your symptoms and do a physical exam. He or she may test your strength and reflexes. X-rays, CT scans, and MRI scans may be needed in cases of injury or if the symptoms do not go away after a period of time. Electromyography (EMG) or nerve conduction testing may be done to study how your nerves and muscles are working. °TREATMENT  °Your caregiver may recommend certain exercises to help relieve your symptoms. Cervical radiculopathy can, and often does, get better with time and treatment. If your problems continue, treatment options may include: °· Wearing a soft collar for short periods of time. °· Physical therapy to strengthen the neck muscles. °· Medicines, such as nonsteroidal anti-inflammatory drugs (NSAIDs), oral corticosteroids, or spinal injections. °· Surgery. Different types of surgery may be done depending on the cause of your problems. °HOME CARE INSTRUCTIONS  °· Put ice on the affected area. °¨ Put ice in a plastic bag. °¨ Place a towel between your skin and the bag. °¨ Leave the ice on for 15-20 minutes,  03-04 times a day or as directed by your caregiver. °· If ice does not help, you can try using heat. Take a warm shower or bath, or use a hot water bottle as directed by your caregiver. °· You may try a gentle neck and shoulder massage. °· Use a flat pillow when you sleep. °· Only take over-the-counter or prescription medicines for pain, discomfort, or fever as directed by your caregiver. °· If physical therapy was prescribed, follow your caregiver's directions. °· If a soft collar was prescribed, use it as directed. °SEEK IMMEDIATE MEDICAL CARE IF:  °· Your pain gets much worse and cannot be controlled with medicines. °· You have weakness or numbness in your hand, arm, face, or leg. °· You have a high fever or a stiff, rigid neck. °· You lose bowel or bladder control (incontinence). °· You have trouble with walking, balance, or speaking. °MAKE SURE YOU:  °· Understand these instructions. °· Will watch your condition. °· Will get help right away if you are not doing well or get worse. °Document Released: 07/10/2001 Document Revised: 01/07/2012 Document Reviewed: 05/29/2011 °ExitCare® Patient Information ©2015 [employer]. This information is not intended to replace advice given to you by your health care provider. Make sure you discuss any questions you have with your health care provider. ° °Chest Pain (Nonspecific) °It is often hard to give a specific diagnosis for the cause of chest pain. There is always a chance that your pain could be related to something serious, such as a heart attack or a blood clot   in the lungs. You need to follow up with your health care provider for further evaluation. °CAUSES  °· Heartburn. °· Pneumonia or bronchitis. °· Anxiety or stress. °· Inflammation around your heart (pericarditis) or lung (pleuritis or pleurisy). °· A blood clot in the lung. °· A collapsed lung (pneumothorax). It can develop suddenly on its own (spontaneous pneumothorax) or from trauma to the chest. °· Shingles  infection (herpes zoster virus). °The chest wall is composed of bones, muscles, and cartilage. Any of these can be the source of the pain. °· The bones can be bruised by injury. °· The muscles or cartilage can be strained by coughing or overwork. °· The cartilage can be affected by inflammation and become sore (costochondritis). °DIAGNOSIS  °Lab tests or other studies may be needed to find the cause of your pain. Your health care provider may have you take a test called an ambulatory electrocardiogram (ECG). An ECG records your heartbeat patterns over a 24-hour period. You may also have other tests, such as: °· Transthoracic echocardiogram (TTE). During echocardiography, sound waves are used to evaluate how blood flows through your heart. °· Transesophageal echocardiogram (TEE). °· Cardiac monitoring. This allows your health care provider to monitor your heart rate and rhythm in real time. °· Holter monitor. This is a portable device that records your heartbeat and can help diagnose heart arrhythmias. It allows your health care provider to track your heart activity for several days, if needed. °· Stress tests by exercise or by giving medicine that makes the heart beat faster. °TREATMENT  °· Treatment depends on what may be causing your chest pain. Treatment may include: °¨ Acid blockers for heartburn. °¨ Anti-inflammatory medicine. °¨ Pain medicine for inflammatory conditions. °¨ Antibiotics if an infection is present. °· You may be advised to change lifestyle habits. This includes stopping smoking and avoiding alcohol, caffeine, and chocolate. °· You may be advised to keep your head raised (elevated) when sleeping. This reduces the chance of acid going backward from your stomach into your esophagus. °Most of the time, nonspecific chest pain will improve within 2-3 days with rest and mild pain medicine.  °HOME CARE INSTRUCTIONS  °· If antibiotics were prescribed, take them as directed. Finish them even if you start  to feel better. °· For the next few days, avoid physical activities that bring on chest pain. Continue physical activities as directed. °· Do not use any tobacco products, including cigarettes, chewing tobacco, or electronic cigarettes. °· Avoid drinking alcohol. °· Only take medicine as directed by your health care provider. °· Follow your health care provider's suggestions for further testing if your chest pain does not go away. °· Keep any follow-up appointments you made. If you do not go to an appointment, you could develop lasting (chronic) problems with pain. If there is any problem keeping an appointment, call to reschedule. °SEEK MEDICAL CARE IF:  °· Your chest pain does not go away, even after treatment. °· You have a rash with blisters on your chest. °· You have a fever. °SEEK IMMEDIATE MEDICAL CARE IF:  °· You have increased chest pain or pain that spreads to your arm, neck, jaw, back, or abdomen. °· You have shortness of breath. °· You have an increasing cough, or you cough up blood. °· You have severe back or abdominal pain. °· You feel nauseous or vomit. °· You have severe weakness. °· You faint. °· You have chills. °This is an emergency. Do not wait to see if the pain   will go away. Get medical help at once. Call your local emergency services (911 in [place] Do not drive yourself to the hospital. °MAKE SURE YOU:  °· Understand these instructions. °· Will watch your condition. °· Will get help right away if you are not doing well or get worse. °Document Released: 07/25/2005 Document Revised: 10/20/2013 Document Reviewed: 05/20/2008 °ExitCare® Patient Information ©2015 [employer]. This information is not intended to replace advice given to you by your health care provider. Make sure you discuss any questions you have with your health care provider. ° ° °Emergency Department Resource Guide °1) Find a Doctor and Pay Out of Pocket °Although you won't have to find out who is covered by your insurance plan,  it is a good idea to ask around and get recommendations. You will then need to call the office and see if the doctor you have chosen will accept you as a new patient and what types of options they offer for patients who are self-pay. Some doctors offer discounts or will set up payment plans for their patients who do not have insurance, but you will need to ask so you aren't surprised when you get to your appointment. ° °2) Contact Your Local Health Department °Not all health departments have doctors that can see patients for sick visits, but many do, so it is worth a call to see if yours does. If you don't know where your local health department is, you can check in your phone book. The CDC also has a tool to help you locate your state's health department, and many state websites also have listings of all of their local health departments. ° °3) Find a Walk-in Clinic °If your illness is not likely to be very severe or complicated, you may want to try a walk in clinic. These are popping up all over the country in pharmacies, drugstores, and shopping centers. They're usually staffed by nurse practitioners or physician assistants that have been trained to treat common illnesses and complaints. They're usually fairly quick and inexpensive. However, if you have serious medical issues or chronic medical problems, these are probably not your best option. ° °No Primary Care Doctor: °- Call [facility] at  832-8000 - they can help you locate a primary care doctor that  accepts your insurance, provides certain services, etc. °- Physician Referral Service- 1-800-533-3463 ° °Chronic Pain Problems: °Organization         Address  Phone   Notes  °[facility]  (336) 297-2271 Patients need to be referred by their primary care doctor.  ° °Medication Assistance: °Organization         Address  Phone   Notes  °[place] [facility] [address] °[place], [place] [address] (336)  641-8030 --Must be a resident of [place] °-- Must have NO insurance coverage whatsoever (no Medicaid/ Medicare, etc.) °-- The pt. MUST have a primary care doctor that directs their care regularly and follows them in the community °  °[facility]  (866) 331-1348   °[employer]  (888) 892-1162   ° °Agencies that provide inexpensive medical care: °Organization         Address  Phone   Notes  °[facility]  (336) 832-8035   °[facility]    (336) 832-7272   °[facility] [address] °[place], [place] [address] (336) 832-4777   °[facility] [address], °[place] (336)   271-4999   °[facility]    (336) 373-0678   °[facility]    (336) 272-1050   °[facility] ° [address], [place] Phone:  (336) 832-4444, Fax:  (336) 832-4440 Hours of Operation:  9 am - 6 pm, M-F.  Also accepts Medicaid/Medicare and self-pay.  °[facility] ° [address], [place] Phone: (336) [phone], Fax: (336) [phone]. Hours of Operation:  8:30 am - 5:30 pm, M-F.  Also accepts Medicaid and self-pay.  °[facility] [place] [address], [place] Phone: (336) 878-6027   °[facility] [address], [place], [place] (336)723-1848, Ext. 123 Mondays & Thursdays: 7-9 AM.  First 15 patients are seen on a first come, first serve basis. °  ° °Medicaid-accepting [place] Providers: ° °Organization         Address  Phone   Notes  °[facility] [address], [place] (336) [phone] Also accepts self-pay patients.  °[facility] [address], [place] ° (336) 856-9996   °[facility] [address], [place] (336) 288-8857   °[facility] [address] [place] [address], [place] (336) 299-7000   °Veita Bland [address], [place]  ° (336)  373-1557 Only accepts [place] Access Medicaid patients after they have their name applied to their card.  ° °Self-Pay (no insurance) in [place]: ° °Organization         Address  Phone   Notes  °Sickle Cell Patients, [facility] [address], [place] (336) 832-1970   °[facility] [address], [place] (336) 832-4400   °[facility] ° [address], [place] (336) 992-4800   °[facility]/Dr. Osei-Bonsu ° [address] [place] [address], [place] or [address], [place] (336) [phone] Phone number for both [place] and [place] locations is the same.  °[facility] [address], [place] (336) 299-0000   °[facility] [address] [place] [address], [place] or [address] (336) 852-7530 °(336) 878-2260   °[facility] [address],  (336) 350-1642, phone; (336) 294-5005, fax Sees patients 1st and 3rd Saturday of every month.  Must not qualify for public or private insurance (i.e. Medicaid, Medicare, [place] Health Choice, Veterans' Benefits) • Household income should be no more than 200% of the poverty level •The clinic cannot treat you if you are pregnant or think you are pregnant • Sexually transmitted diseases are not treated at the clinic.  ° ° °Dental Care: °Organization         Address  Phone  Notes  °[facility] [address],  (336) 641-6152 Accepts children up to age 21 who are enrolled in Medicaid or [place] Health Choice; pregnant women with a Medicaid card; and children who have applied for Medicaid or [place] Health Choice, but were declined, whose parents can pay a reduced fee at time of service.  °[facility] [place]  [address], [place] (336) 641-7733 Accepts children up to age 21 who are enrolled in Medicaid or [place] [facility]  Choice; pregnant women with a Medicaid card; and children who have applied for Medicaid or [place] Health Choice,   but were declined, whose parents can pay a reduced fee at time of service.  °[facility] ° [address], [place] (336) 641-4533 Patients are seen by appointment only. Walk-ins are not accepted. [facility] will see patients 18 years of age and older. °Monday - Tuesday (8am-5pm) °Most Wednesdays (8:30-5pm) °$30 per visit, cash only  °[facility] ° [address], [place] (336) 641-4533 Patients are seen by appointment only. Walk-ins are not accepted. [facility] will see patients 18 years of age and older. °One Wednesday Evening (Monthly: Volunteer Based).  $30 per visit, cash only  °[facility]  (919) [phone] for adults; Children under age 4, call Graduate Pediatric Dentistry at ([phone]) [phone]. Children aged 4-14, please call ([phone]) [phone] to request a pediatric application. ° Dental services are provided in all areas of dental care including fillings, crowns and bridges, complete and partial dentures, implants, gum treatment, root canals, and extractions. Preventive care is also provided. Treatment is provided to both adults and children. °Patients are selected via a lottery and there is often a waiting list. °  °[facility] [address], °[place] ° (336) 763-8833 [email or web address] °  °[facility] [address], [place], [place] (336)723-1848, Ext. 123 Second and Fourth Thursday of each month, opens at 6:30 AM; Clinic ends at 9 AM.  Patients are seen on a first-come first-served basis, and a limited number are seen during each clinic.  ° °[facility] ° [address], [place], [place] (336) 723-7904   Eligibility Requirements °You must have lived in [place], or [place] counties for at least the last three months. °  You cannot be eligible for state or  federal sponsored healthcare insurance, including Veterans Administration, Medicaid, or Medicare. °  You generally cannot be eligible for healthcare insurance through your employer.  °  How to apply: °Eligibility screenings are held every Tuesday and Wednesday afternoon from 1:00 pm until 4:00 pm. You do not need an appointment for the interview!  °[facility] [address], [place], [place] 336-631-2330   °[facility]  336-342-8273   °[facility]  336-703-3100   °[facility]  336-570-6415   ° °Behavioral Health Resources in the Community: °Intensive Outpatient Programs °Organization         Address  Phone  Notes  °[facility] [address], [place], [place] 336-878-6098   °[facility] [address], [place], [place] 336-832-9800   °[facility] [address], [place], [place] ° 336-882-2125   °[facility] [address],  °[place], [place] 1-800-853-5163 or 336-641-4981   °Substance Abuse Resources °Organization         Address  Phone  Notes  °[facility]  336-882-2125   °[facility]  336-784-9470   °[facility]  336-285-9073   °[facility]  336-845-3988   °Residential & Outpatient Substance Abuse Program  1-800-659-3381   °Psychological Services °Organization         Address  Phone  Notes  °[facility]  336- 832-9600   °[facility]  336- 378-7881   °[facility] [address], [place] 1-800-853-5163 or 336-641-4981   ° °Mobile Crisis Teams °Organization         Address  Phone  Notes  °[facility], Mobile Crisis Care Unit  1-877-626-1772   °Assertive °[facility] ° [address] [place], [place] 336-834-9664   °Sharon DeEsch [address] °[place] 336-554-5454   ° °Self-Help/Support Groups °Organization         Address  Phone              Notes  °[facility] - variety of support groups  336- 373-1402 Call for more information  °Narcotics Anonymous (NA), [facility] [address], °[place]  2 meetings at this location  ° °Residential Treatment Programs °Organization         Address  Phone  Notes  °[facility] [address],    °[place]  1-866-801-8205   °[facility] ° [address], [place], [place] 704-293-8524   °[facility] [address], [place] 336-845-3988 Admissions: 8am-3pm M-F  °[facility] [address]    °[place], [place] 336-841-1104   °[facility] [address], [place], [place] 336-379-7146   °[facility] [address]  °[place], [place] 336-285-9073   °[facility] Intensive Outpatient [address], [place], [place] 336-852-3033   °[facility] [address]  °[place], [place] 1-877-615-2722 or 336-784-9470   °[facility] [address] [place], [place] 336-227-7417 Accepts Medicaid  °[facility] [address]  °[place] 1-800-659-3381 Substance Abuse/Addiction Treatment  ° °[place] Behavioral Health Resources °Organization         Address  Phone  Notes  °[facility]  (888) 581-9988   °Julie Brannon, PhD [address] [place], [place]   (336) 349-5553 or (336) 951-0000   °[facility]   [address] °[place], [place] (336) 349-4454   °[facility] [address], [place], [place] (336) 342-8316 Insurance/Medicaid/sponsorship through [facility]  °[facility] [address]                                    [place], [place] (336) 342-8316 Therapy/tele-psych/case  °[facility] [address]  ° [place], [place] (336) 349-2233    °Dr. Arfeen  (336) 349-4544   °[facility] [place]  [employer] [place] [facility]  Dept. 1) [address],  °2) [address], [place] °3)  [address], [place] (336) 349-3220 °(336) 342-7768 ° °(336) 342-8140   °[place] [facility] (336) 342-1394 or (336) 342-3537 (After Hours)    ° ° ° °

## 2014-11-16 NOTE — Telephone Encounter (Signed)
Agree with this plan.

## 2014-11-16 NOTE — ED Provider Notes (Signed)
CSN: [id number]     Arrival date & time 11/16/14  1011 History   First MD Initiated Contact with Patient 11/16/14 1022     Chief Complaint  Patient presents with  . Chest Pain     (Consider location/radiation/quality/duration/timing/severity/associated sxs/prior Treatment) HPI Darrell King is a 49 year old male with past medical history of asthma, anxiety with hospital admission last year for aseptic meningitis who presents the ER complaining of right-sided chest pain and right arm pain. Patient states his pain began approximately 2 weeks ago, has been intermittent for the past 2 weeks. Patient states his pain will "flareup and remained constant for approximately 3-4 days, then gradually subside. Patient states this has happened persistently over the past 2 weeks, and he has tried taking ibuprofen for his pain without any relief. Patient reports his pain is exacerbated with lying flat, and he notices some improvement with sitting upright. Patient also notes worsening of his pain with range of motion of his neck. Patient reports some associated paresthesias and weakness in his right index and long finger. Patient denies shortness of breath, palpitations, dizziness, headache, blurred vision, nausea, vomiting, abdominal pain, fever.  Past Medical History  Diagnosis Date  . Asthma   . Refusal of blood transfusions as patient is Jehovah's Witness   . Anxiety     "had taken Paxil; back in 1996; nothing since" (10/21/2013)   Past Surgical History  Procedure Laterality Date  . Inguinal hernia repair Right 1995  . Inguinal hernia repair Left 11/12/2013    Procedure: HERNIA REPAIR INGUINAL INCARCERATED;  Surgeon: Odis Hollingshead, MD;  Location: [place];  Service: General;  Laterality: Left;   Family History  Problem Relation Age of Onset  . Colon cancer Mother     colon  . Cancer Sister     dont know what type   History  Substance Use Topics  . Smoking status: Never Smoker   . Smokeless  tobacco: Never Used  . Alcohol Use: 0.0 oz/week    1-2 Shots of liquor per week     Comment: month    Review of Systems  Constitutional: Negative for fever.  HENT: Negative for trouble swallowing.   Eyes: Negative for visual disturbance.  Respiratory: Negative for shortness of breath.   Cardiovascular: Negative for chest pain.  Gastrointestinal: Negative for nausea, vomiting and abdominal pain.  Genitourinary: Negative for dysuria.  Musculoskeletal: Negative for neck pain.       Right sided chest pain  Skin: Negative for rash.  Neurological: Negative for dizziness, weakness and numbness.  Psychiatric/Behavioral: Negative.       Allergies  Review of patient's allergies indicates no known allergies.  Home Medications   Prior to Admission medications   Medication Sig Start Date End Date Taking? Authorizing Provider  HYDROcodone-acetaminophen (NORCO/VICODIN) 5-325 MG per tablet Take 1-2 tablets by mouth every 6 (six) hours as needed for moderate pain or severe pain. 11/16/14   Carrie Mew, PA-C  MOVIPREP 100 G SOLR Take 1 kit (200 g total) by mouth once. 01/04/14   Milus Banister, MD  predniSONE (DELTASONE) 20 MG tablet Take 1 tablet (20 mg total) by mouth 2 (two) times daily with a meal. 11/16/14   Carrie Mew, PA-C   BP 134/87 mmHg  Pulse 68  Resp 14  SpO2 99% Physical Exam  Constitutional: He is oriented to person, place, and time. He appears well-developed and well-nourished. No distress.  HENT:  Head: Normocephalic and atraumatic.  Mouth/Throat: Oropharynx  is clear and moist. No oropharyngeal exudate.  Eyes: Right eye exhibits no discharge. Left eye exhibits no discharge. No scleral icterus.  Neck: Normal range of motion and full passive range of motion without pain. Neck supple. No spinous process tenderness and no muscular tenderness present. No rigidity. No edema, no erythema and normal range of motion present.  Cardiovascular: Normal rate, regular rhythm and  normal heart sounds.   No murmur heard. Pulmonary/Chest: Effort normal and breath sounds normal. No respiratory distress.  Abdominal: Soft. There is no tenderness.  Musculoskeletal: Normal range of motion. He exhibits no edema or tenderness.       Cervical back: Normal. He exhibits normal range of motion, no tenderness, no bony tenderness, no swelling, no edema and no deformity.       Thoracic back: Normal.       Lumbar back: Normal.  Neurological: He is alert and oriented to person, place, and time. He has normal strength. No cranial nerve deficit or sensory deficit. Coordination and gait normal. GCS eye subscore is 4. GCS verbal subscore is 5. GCS motor subscore is 6.  Patient fully alert answering questions appropriately in full, clear sentences. Cranial nerves II through XII grossly intact. Motor strength 5 out of 5 in all major muscle groups of upper and lower extremities. Distal sensation intact.  Skin: Skin is warm and dry. No rash noted. He is not diaphoretic.  Psychiatric: He has a normal mood and affect.  Nursing note and vitals reviewed.   ED Course  Procedures (including critical care time) Labs Review Labs Reviewed  BASIC METABOLIC PANEL - Abnormal; Notable for the following:    GFR calc non Af Amer 76 (*)    GFR calc Af Amer 88 (*)    All other components within normal limits  CBC  I-STAT TROPOININ, ED    Imaging Review Dg Chest 2 View  11/16/2014   CLINICAL DATA:  Right-sided chest pain.  EXAM: CHEST  2 VIEW  COMPARISON:  None.  FINDINGS: Mediastinum hilar structures normal. Lungs are clear. Heart size normal. No pleural effusion or pneumothorax. Chest stable from prior exam.  IMPRESSION: No active cardiopulmonary disease.   Electronically Signed   By: Marcello Moores  Register   On: 11/16/2014 11:57     EKG Interpretation   Date/Time:  Tuesday November 16 2014 10:14:38 EST Ventricular Rate:  72 PR Interval:  174 QRS Duration: 84 QT Interval:  364 QTC Calculation: 398 R  Axis:   71 Text Interpretation:  Normal sinus rhythm ST elevation, consider early  repolarization Borderline ECG Confirmed by DELOS  MD, DOUGLAS ([id number]) on  11/16/2014 10:43:42 AM      MDM   Final diagnoses:  Right-sided chest wall pain  Right arm pain   Patient here complaining of right-sided chest pain and arm pain. [name] pain is atypical for ACS, HEART score <3. PERC negative. EKG without evidence of injury or ectopy. No leukocytosis, anemia, electrolyte abnormality. Renal function intact. Chest x-ray without remarkable abnormalities. Pain reproducible with range of motion of neck and right shoulder, most likely patient's pain radicular in nature. Patient treated symptomatically, and encouraged to follow-up with primary care provider. Patient provided with follow-up to neurosurgery if his symptoms persist, or worsen. Return precautions discussed with patient, and patient encouraged to call or return to ER should he have any questions or concerns.  BP 134/87 mmHg  Pulse 68  Resp 14  SpO2 99%  Signed,  Dahlia Bailiff, PA-C 6:06 PM  Patient  seen and discussed with Dr. Veryl Speak, M.D.    Carrie Mew, PA-C 11/16/14 1806  Veryl Speak, MD 11/19/14 8887  Veryl Speak, MD 11/19/14 [phone]

## 2014-11-16 NOTE — ED Notes (Signed)
Pt placed on cardiac monitoring, cont. bp and pulse ox.

## 2014-11-29 ENCOUNTER — Other Ambulatory Visit: Payer: Self-pay | Admitting: Internal Medicine

## 2014-11-29 ENCOUNTER — Encounter: Payer: Self-pay | Admitting: Internal Medicine

## 2014-11-29 ENCOUNTER — Ambulatory Visit (INDEPENDENT_AMBULATORY_CARE_PROVIDER_SITE_OTHER): Payer: 59 | Admitting: Internal Medicine

## 2014-11-29 VITALS — BP 137/95 | HR 74 | Temp 98.1°F | Ht >= 80 in | Wt 280.9 lb

## 2014-11-29 DIAGNOSIS — R0789 Other chest pain: Secondary | ICD-10-CM

## 2014-11-29 DIAGNOSIS — R200 Anesthesia of skin: Secondary | ICD-10-CM

## 2014-11-29 DIAGNOSIS — R202 Paresthesia of skin: Secondary | ICD-10-CM

## 2014-11-29 DIAGNOSIS — K921 Melena: Secondary | ICD-10-CM

## 2014-11-29 NOTE — Assessment & Plan Note (Addendum)
Pt p/w symptom pain and paresthesia of the RUE for the past 2 months.  Involves the thumb and the first 2 digits of the right hand.  No weakness, radial pulses 2+ b/l.  States the pain/paresthesia is worse at night and when he lies down.  Has tried ibuprofen and vicodin which helps somewhat.  States tylenol does not help.  No known trauma.  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  +Tinel and Phalen so likely CTS.   -advised to purchase a wrist split at a local pharmacy as we do not have these in the clinic -provided info on CTS -advised to wear nightly for maximum benefit -RTC in ~1 month to reassess -[facility]

## 2014-11-29 NOTE — Patient Instructions (Addendum)
Thank you for your visit today.   Please return to the internal medicine clinic in 1 month(s) or sooner if needed.     Please get a wrist splint for your right hand at a drug store and wear nightly.    Also, please take an acid reflux pill daily before breakfast. You may purchase prilosec OTC and take daily. Also, follow the diet below.   Please be sure to bring all of your medications with you to every visit; this includes herbal supplements, vitamins, eye drops, and any over-the-counter medications.   Should you have any questions regarding your medications and/or any new or worsening symptoms, please be sure to call the clinic at [phone].   If you believe that you are suffering from a life threatening condition or one that may result in the loss of limb or function, then you should call 911 or proceed to the nearest Emergency Department.   Carpal Tunnel Syndrome The carpal tunnel is a narrow area located on the palm side of your wrist. The tunnel is formed by the wrist bones and ligaments. Nerves, blood vessels, and tendons pass through the carpal tunnel. Repeated wrist motion or certain diseases may cause swelling within the tunnel. This swelling pinches the main nerve in the wrist (median nerve) and causes the painful hand and arm condition called carpal tunnel syndrome. CAUSES   Repeated wrist motions.  Wrist injuries.  Certain diseases like arthritis, diabetes, alcoholism, hyperthyroidism, and kidney failure.  Obesity.  Pregnancy. SYMPTOMS   A "pins and needles" feeling in your fingers or hand, especially in your thumb, index and middle fingers.  Tingling or numbness in your fingers or hand.  An aching feeling in your entire arm, especially when your wrist and elbow are bent for long periods of time.  Wrist pain that goes up your arm to your shoulder.  Pain that goes down into your palm or fingers.  A weak feeling in your hands. DIAGNOSIS  Your health care  provider will take your history and perform a physical exam. An electromyography test may be needed. This test measures electrical signals sent out by your nerves into the muscles. The electrical signals are usually slowed by carpal tunnel syndrome. You may also need X-rays. TREATMENT  Carpal tunnel syndrome may clear up by itself. Your health care provider may recommend a wrist splint or medicine such as a nonsteroidal anti-inflammatory medicine. Cortisone injections may help. Sometimes, surgery may be needed to free the pinched nerve.  HOME CARE INSTRUCTIONS   Take all medicine as directed by your health care provider. Only take over-the-counter or prescription medicines for pain, discomfort, or fever as directed by your health care provider.  If you were given a splint to keep your wrist from bending, wear it as directed. It is important to wear the splint at night. Wear the splint for as long as you have pain or numbness in your hand, arm, or wrist. This may take 1 to 2 months.  Rest your wrist from any activity that may be causing your pain. If your symptoms are work-related, you may need to talk to your employer about changing to a job that does not require using your wrist.  Put ice on your wrist after long periods of wrist activity.  Put ice in a plastic bag.  Place a towel between your skin and the bag.  Leave the ice on for 15-20 minutes, 03-04 times a day.  Keep all follow-up visits as directed by your  health care provider. This includes any orthopedic referrals, physical therapy, and rehabilitation. Any delay in getting necessary care could result in a delay or failure of your condition to heal. SEEK IMMEDIATE MEDICAL CARE IF:   You have new, unexplained symptoms.  Your symptoms get worse and are not helped or controlled with medicines. MAKE SURE YOU:   Understand these instructions.  Will watch your condition.  Will get help right away if you are not doing well or get  worse. Document Released: 10/12/2000 Document Revised: 03/01/2014 Document Reviewed: 08/31/2011 [facility] Patient Information 2015 [place], [place]. This information is not intended to replace advice given to you by your health care provider. Make sure you discuss any questions you have with your health care provider.   Food Choices for Gastroesophageal Reflux Disease When you have gastroesophageal reflux disease (GERD), the foods you eat and your eating habits are very important. Choosing the right foods can help ease the discomfort of GERD. WHAT GENERAL GUIDELINES DO I NEED TO FOLLOW?  Choose fruits, vegetables, whole grains, low-fat dairy products, and low-fat meat, fish, and poultry.  Limit fats such as oils, salad dressings, butter, nuts, and avocado.  Keep a food diary to identify foods that cause symptoms.  Avoid foods that cause reflux. These may be different for different people.  Eat frequent small meals instead of three large meals each day.  Eat your meals slowly, in a relaxed setting.  Limit fried foods.  Cook foods using methods other than frying.  Avoid drinking alcohol.  Avoid drinking large amounts of liquids with your meals.  Avoid bending over or lying down until 2-3 hours after eating. WHAT FOODS ARE NOT RECOMMENDED? The following are some foods and drinks that may worsen your symptoms: Vegetables Tomatoes. Tomato juice. Tomato and spaghetti sauce. Chili peppers. Onion and garlic. Horseradish. Fruits Oranges, grapefruit, and lemon (fruit and juice). Meats High-fat meats, fish, and poultry. This includes hot dogs, ribs, ham, sausage, salami, and bacon. Dairy Whole milk and chocolate milk. Sour cream. Cream. Butter. Ice cream. Cream cheese.  Beverages Coffee and tea, with or without caffeine. Carbonated beverages or energy drinks. Condiments Hot sauce. Barbecue sauce.  Sweets/Desserts Chocolate and cocoa. Donuts. Peppermint and spearmint. Fats and  Oils High-fat foods, including [place] fries and potato chips. Other Vinegar. Strong spices, such as black pepper, white pepper, red pepper, cayenne, curry powder, cloves, ginger, and chili powder. The items listed above may not be a complete list of foods and beverages to avoid. Contact your dietitian for more information. Document Released: 10/15/2005 Document Revised: 10/20/2013 Document Reviewed: 08/19/2013 [facility]Exit[facility] Patient Information 2015 [place], [place]. This information is not intended to replace advice given to you by your health care provider. Make sure you discuss any questions you have with your health care provider.

## 2014-11-29 NOTE — Progress Notes (Signed)
Patient ID: Darrell King, male   DOB: 1966/08/28, 49 y.o.   MRN: [id number]    Subjective:   Patient ID: Darrell ShinesCharles B King male    DOB: 1966/08/28 49 y.o.    MRN: [id number] Health Maintenance Due: Health Maintenance Due  Topic Date Due  . TETANUS/TDAP  03/10/1986  . INFLUENZA VACCINE  05/29/2014    _________________________________________________  HPI: [name] is a 49 y.o. male here for an ER f/u for CP.  Pt has a PMH outlined below.  Please see problem-based charting assessment and plan note for further details of medical issues addressed at today's visit.  PMH: Past Medical History  Diagnosis Date  . Asthma   . Refusal of blood transfusions as patient is Jehovah's Witness   . Anxiety     "had taken Paxil; back in 1996; nothing since" (10/21/2013)    Medications: No current outpatient prescriptions on file prior to visit.   No current facility-administered medications on file prior to visit.    Allergies: No Known Allergies  FH: Family History  Problem Relation Age of Onset  . Colon cancer Mother     colon  . Cancer Sister     dont know what type    SH: History   Social History  . Marital Status: Married    Spouse Name: N/A    Number of Children: 3  . Years of Education: N/A   Occupational History  . Maintenance Tech    Social History Main Topics  . Smoking status: Never Smoker   . Smokeless tobacco: Never Used  . Alcohol Use: 0.0 oz/week    1-2 Shots of liquor per week     Comment: month  . Drug Use: No  . Sexual Activity: Yes   Other Topics Concern  . None   Social History Narrative    Review of Systems: Constitutional: Negative for fever, chills and weight loss.  Eyes: Negative for blurred vision.  Respiratory: Negative for cough and shortness of breath.  Cardiovascular: Negative for chest pain, palpitations and leg swelling.  Gastrointestinal: Negative for nausea, vomiting, abdominal pain, diarrhea, constipation and  blood in stool.  Genitourinary: Negative for dysuria, urgency and frequency.  Musculoskeletal: Negative for myalgias and back pain.  Neurological: Negative for dizziness, weakness and headaches.     Objective:   Vital Signs: Filed Vitals:   11/29/14 1608  BP: 137/95  Pulse: 74  Temp: 98.1 F (36.7 C)  TempSrc: Oral  Height: 6\' 9"  (2.057 m)  Weight: 280 lb 14.4 oz (127.415 kg)  SpO2: 100%      BP Readings from Last 3 Encounters:  11/29/14 137/95  11/16/14 134/87  01/04/14 110/80    Physical Exam: Constitutional: Vital signs reviewed.  Patient is well-developed and well-nourished in NAD and cooperative with exam.  Head: Normocephalic and atraumatic. Eyes: PERRL, EOMI, conjunctivae nl, no scleral icterus.  Neck: Supple. Cardiovascular: RRR, no MRG. Pulmonary/Chest: normal effort, non-tender to palpation, CTAB, no wheezes, rales, or rhonchi. Abdominal: Soft. NT/ND +BS. Neurological: A&O x3, cranial nerves II-XII are grossly intact, moving all extremities. Musculoskeletal: RUE: +Phalens, +Tinel.  Good ROM of UE with 5/5 strength bl, 2+ radial pulses b/l.  No joint deformity, swelling, or erythema.   Extremities: 2+DP b/l; no pitting edema. Skin: Warm, dry and intact. No rash.   Assessment & Plan:   Assessment and plan was discussed and formulated with my attending.

## 2014-11-29 NOTE — Progress Notes (Signed)
Case discussed with Dr. Gill at the time of the visit.  We reviewed the resident's history and exam and pertinent patient test results.  I agree with the assessment, diagnosis, and plan of care documented in the resident's note. 

## 2014-11-29 NOTE — Assessment & Plan Note (Addendum)
Symptoms have resolved since last OV.  Saw Dr. Christella HartiganJacobs in March 2015 for blood in stool. Likely also suspects hemorrhoids; they discussed colonoscopy when convenient.   -colonoscopy at earliest convenience (f/u with Dr. Christella HartiganJacobs)  -continue to monitor

## 2014-11-29 NOTE — Assessment & Plan Note (Addendum)
Pt presented to ED with non-exertional right-sided CP.  EKG and trop neg.  Heart score:0, so risk of adverse cardiac event extremely low.  No known risk factors for CAD.  Likely GERD vs. MSK.  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  Reports pain is worse when he lies down which is not c/w CAD.   -advised to take a PPI OTC daily before breakfast -given info on GERD diet -advised to f/u in ~4 weeks

## 2014-12-28 NOTE — Addendum Note (Signed)
Addended by: Neomia DearPOWERS, Meelah Tallo E on: 12/28/2014 08:51 AM   Modules accepted: Orders

## 2016-01-30 ENCOUNTER — Encounter: Payer: Self-pay | Admitting: Internal Medicine

## 2016-02-02 ENCOUNTER — Encounter: Payer: Self-pay | Admitting: Internal Medicine

## 2016-02-02 ENCOUNTER — Ambulatory Visit (INDEPENDENT_AMBULATORY_CARE_PROVIDER_SITE_OTHER): Payer: BLUE CROSS/BLUE SHIELD | Admitting: Internal Medicine

## 2016-02-02 VITALS — BP 134/85 | HR 68 | Temp 97.8°F | Ht >= 80 in | Wt 281.5 lb

## 2016-02-02 DIAGNOSIS — M67911 Unspecified disorder of synovium and tendon, right shoulder: Secondary | ICD-10-CM | POA: Insufficient documentation

## 2016-02-02 DIAGNOSIS — M7581 Other shoulder lesions, right shoulder: Secondary | ICD-10-CM | POA: Diagnosis not present

## 2016-02-02 NOTE — Progress Notes (Signed)
Patient ID: Darrell King, male   DOB: [birth year], 50 y.o.   MRN: [id number]      Subjective:   Patient ID: Darrell ShinesCharles B King male    DOB: [birth year] 50 y.o.    MRN: [id number] Health Maintenance Due: Health Maintenance Due  Topic Date Due  . TETANUS/TDAP  03/10/1986    _________________________________________________  HPI: Darrell King is a 50 y.o. male here for an acute visit for right shoulder pain.  Pt has a PMH outlined below.  Please see problem-based charting assessment and plan for further status of patient's chronic medical problems addressed at today's visit.  PMH: Past Medical History  Diagnosis Date  . Asthma   . Refusal of blood transfusions as patient is Jehovah's Witness   . Anxiety     "had taken Paxil; back in 1996; nothing since" (10/21/2013)    Medications: No current outpatient prescriptions on file prior to visit.   No current facility-administered medications on file prior to visit.    Allergies: No Known Allergies  FH: Family History  Problem Relation Age of Onset  . Colon cancer Mother     colon  . Cancer Sister     dont know what type    SH: Social History   Social History  . Marital Status: Married    Spouse Name: N/A  . Number of Children: 3  . Years of Education: N/A   Occupational History  . Maintenance Tech    Social History Main Topics  . Smoking status: Never Smoker   . Smokeless tobacco: Never Used  . Alcohol Use: 0.0 oz/week    1-2 Shots of liquor per week     Comment: occasionally.  . Drug Use: No  . Sexual Activity: Not Asked   Other Topics Concern  . None   Social History Narrative    Review of Systems: Constitutional: Negative for fever, chills and weight loss.  Cardiovascular: Negative for chest pain. Musculoskeletal: +right shoulder pain and neg cervical pain.   Neurological: Negative for weakness.     Objective:   Vital Signs: Filed Vitals:   02/02/16 1401  BP: 134/85  Pulse: 68    Temp: 97.8 F (36.6 C)  TempSrc: Oral  Height: 6\' 9"  (2.057 m)  Weight: 281 lb 8 oz (127.688 kg)  SpO2: 97%      BP Readings from Last 3 Encounters:  02/02/16 134/85  11/29/14 137/95  11/16/14 134/87    Physical Exam: Constitutional: Vital signs reviewed.  Patient is in NAD and cooperative with exam.  Head: Normocephalic and atraumatic. Eyes: EOMI, conjunctivae nl, no scleral icterus.  Cardiovascular: RRR, no MRG. Pulmonary/Chest: normal effort. Musculoskeletal: Right shoulder: decreased ROM, abduction limited to 90 degrees on passive and active ROM, internal rotation limited, tenderness of the deltoid and AC joint. Normal strength and sensation.  Neurological: A&O x3, cranial nerves II-XII are grossly intact, moving all extremities. Skin: Warm, dry and intact.   Assessment & Plan:   Assessment and plan was discussed and formulated with my attending.

## 2016-02-02 NOTE — Patient Instructions (Addendum)
Thank you for your visit today.   Please return to the internal medicine clinic in 1 month(s) or sooner if needed.     Please let us know if you have increased pain, swelling, redness, warmth, fever/chills after your injection.    Please be sure to bring all of your medications with you to every visit; this includes herbal supplements, vitamins, eye drops, and any over-the-counter medications.   Should you have any questions regarding your medications and/or any new or worsening symptoms, please be sure to call the clinic at [phone].   If you believe that you are suffering from a life threatening condition or one that may result in the loss of limb or function, then you should call 911 and proceed to the nearest Emergency Department.    Rotator Cuff Injury Rotator cuff injury is any type of injury to the set of muscles and tendons that make up the stabilizing unit of your shoulder. This unit holds the ball of your upper arm bone (humerus) in the socket of your shoulder blade (scapula).  CAUSES Injuries to your rotator cuff most commonly come from sports or activities that cause your arm to be moved repeatedly over your head. Examples of this include throwing, weight lifting, swimming, or racquet sports. Long lasting (chronic) irritation of your rotator cuff can cause soreness and swelling (inflammation), bursitis, and eventual damage to your tendons, such as a tear (rupture). SIGNS AND SYMPTOMS Acute rotator cuff tear:  Sudden tearing sensation followed by severe pain shooting from your upper shoulder down your arm toward your elbow.  Decreased range of motion of your shoulder because of pain and muscle spasm.  Severe pain.  Inability to raise your arm out to the side because of pain and loss of muscle power (large tears). Chronic rotator cuff tear:  Pain that usually is worse at night and may interfere with sleep.  Gradual weakness and decreased shoulder motion as the pain  worsens.  Decreased range of motion. Rotator cuff tendinitis:  Deep ache in your shoulder and the outside upper arm over your shoulder.  Pain that comes on gradually and becomes worse when lifting your arm to the side or turning it inward. DIAGNOSIS Rotator cuff injury is diagnosed through a medical history, physical exam, and imaging exam. The medical history helps determine the type of rotator cuff injury. Your health care provider will look at your injured shoulder, feel the injured area, and ask you to move your shoulder in different positions. X-ray exams typically are done to rule out other causes of shoulder pain, such as fractures. MRI is the exam of choice for the most severe shoulder injuries because the images show muscles and tendons.  TREATMENT  Chronic tear:  Medicine for pain, such as acetaminophen or ibuprofen.  Physical therapy and range-of-motion exercises may be helpful in maintaining shoulder function and strength.  Steroid injections into your shoulder joint.  Surgical repair of the rotator cuff if the injury does not heal with noninvasive treatment. Acute tear:  Anti-inflammatory medicines such as ibuprofen and naproxen to help reduce pain and swelling.  A sling to help support your arm and rest your rotator cuff muscles. Long-term use of a sling is not advised. It may cause significant stiffening of the shoulder joint.  Surgery may be considered within a few weeks, especially in younger, active people, to return the shoulder to full function.  Indications for surgical treatment include the following:  Age younger than 60 years.  Rotator cuff  tears that are complete.  Physical therapy, rest, and anti-inflammatory medicines have been used for 6-8 weeks, with no improvement.  Employment or sporting activity that requires constant shoulder use. Tendinitis:  Anti-inflammatory medicines such as ibuprofen and naproxen to help reduce pain and swelling.  A  sling to help support your arm and rest your rotator cuff muscles. Long-term use of a sling is not advised. It may cause significant stiffening of the shoulder joint.  Severe tendinitis may require:  Steroid injections into your shoulder joint.  Physical therapy.  Surgery. HOME CARE INSTRUCTIONS   Apply ice to your injury:  Put ice in a plastic bag.  Place a towel between your skin and the bag.  Leave the ice on for 20 minutes, 2-3 times a day.  If you have a shoulder immobilizer (sling and straps), wear it until told otherwise by your health care provider.  You may want to sleep on several pillows or in a recliner at night to lessen swelling and pain.  Only take over-the-counter or prescription medicines for pain, discomfort, or fever as directed by your health care provider.  Do simple hand squeezing exercises with a soft rubber ball to decrease hand swelling. SEEK MEDICAL CARE IF:   Your shoulder pain increases, or new pain or numbness develops in your arm, hand, or fingers.  Your hand or fingers are colder than your other hand. SEEK IMMEDIATE MEDICAL CARE IF:   Your arm, hand, or fingers are numb or tingling.  Your arm, hand, or fingers are increasingly swollen and painful, or they turn white or blue. MAKE SURE YOU:  Understand these instructions.  Will watch your condition.  Will get help right away if you are not doing well or get worse.   This information is not intended to replace advice given to you by your health care provider. Make sure you discuss any questions you have with your health care provider.   Document Released: 10/12/2000 Document Revised: 10/20/2013 Document Reviewed: 05/27/2013 Elsevier Interactive Patient Education [employer]2016 Elsevier Inc.

## 2016-02-03 NOTE — Assessment & Plan Note (Addendum)
Pt p/w anterior right shoulder pain for several days.  He also reports pain in the deltoid area.  He has tried ibuprofen which has not offered relief.  He is unable to abduct the shoulder more than 90 degrees.  He also has difficulty with internal rotation.  No h/o previous symptoms.  Denies neck pain.   No trauma.  He also reports some paresthesias in the 2nd and 3rd digits (mainly the fingertips) which I doubt is related.  We discussed a right shoulder injection and he wishes to proceed.  -right shoulder injection -he will f/u in 1 month    PROCEDURE NOTE  PROCEDURE: right shoulder joint steroid injection.  PREOPERATIVE DIAGNOSIS: Tendonitis of the right shoulder.  POSTOPERATIVE DIAGNOSIS: Tendonitis of the right shoulder.  PROCEDURE: The patient was apprised of the risks and the benefits of the procedure and informed consent was obtained, as [facility] by [facility].  Time-out procedure was performed, with confirmation of the patient's name, date of birth, and correct identification of the right shoulder to be injected. The patient's shoulder was then marked at the appropriate site for injection placement. The shoulder was sterilely prepped with Betadine. A 40 mg (1 milliliter) solution of Kenalog was drawn up into a 3mL syringe with 1mL of 1% lidocaine. The patient was injected with a 27-gauge needle at the of his lateral right shoulder. There were no complications. The patient tolerated the procedure well. There was minimal bleeding.  The patient was instructed to return with any usual pain, swelling, or redness occurred in the injected area. The patient will followup to evaluate response to the injection to his increased range of motion and reduction of pain.  The procedure was supervised by attending physician, Dr. Josem KaufmannKlima.

## 2016-02-06 NOTE — Progress Notes (Signed)
I saw and evaluated the patient.  I personally confirmed the key portions of Dr. Shiela MayerGill's history and exam and reviewed pertinent patient test results.  The assessment, diagnosis, and plan were formulated together and I agree with the documentation in the resident's note.  I was present for, and supervised, the entire shoulder injection at the patient side.  He tolerated the procedure well and had marked improvement in his ROM and decrease in his shoulder pain immediately after the procedure.

## 2016-02-22 ENCOUNTER — Telehealth: Payer: Self-pay | Admitting: *Deleted

## 2016-02-22 NOTE — Telephone Encounter (Signed)
Pt's wife calls and states pt's BP has been elevated for 2 days, was sent home yesterday due to elevated BP 190/118. Today his bp is continuing to be elevated 190's/ 110. 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 His facility director has told him he is needed at work and cannot leave today even though his BP is elevated. He states he would rather wait until tomorrow am for the appt that was scheduled as he [facility]/ [facility] His wife adds that a few years ago he had a bite from an insect or spider then ended up w/ meningitis and BP's were elevated at that time, looked back in his chart, this was 32015, she states as he moved a resident Saturday he ended up with a bite on his upper arm that is red and has gotten larger since Saturday, his work drew a circle around the area Monday, the area is now swollen and larger He denies h/a, n&v, chest pain, shortness of breath, weakness, no vision changes He is advised that a visit to urg care would be desirable but he states again he cannot leave work. He then is ask that before he leaves work at [employer] that he have the nurse check his BP in both arms after resting for about 20 mins, to write these down and bring it with him, if his bp is still elevated to please go by urg care here at [facility] and have them assess him, he is agreeable His wife told me about his past history after he left the conversation, after hearing her hx of 2015 i am more concerned Please advise

## 2016-02-22 NOTE — Telephone Encounter (Signed)
Given his complaints and unwillingness to come to the clinic today I am comfortable with seeing him as scheduled tomorrow.

## 2016-02-22 NOTE — Telephone Encounter (Signed)
Called spouse, reassured her, pt will be in tomorrow am

## 2016-02-23 ENCOUNTER — Encounter: Payer: Self-pay | Admitting: Internal Medicine

## 2016-02-23 ENCOUNTER — Ambulatory Visit (INDEPENDENT_AMBULATORY_CARE_PROVIDER_SITE_OTHER): Payer: BLUE CROSS/BLUE SHIELD | Admitting: Internal Medicine

## 2016-02-23 ENCOUNTER — Encounter: Payer: Self-pay | Admitting: *Deleted

## 2016-02-23 VITALS — BP 143/95 | HR 61 | Temp 97.7°F | Ht >= 80 in | Wt 276.5 lb

## 2016-02-23 DIAGNOSIS — I10 Essential (primary) hypertension: Secondary | ICD-10-CM

## 2016-02-23 DIAGNOSIS — K921 Melena: Secondary | ICD-10-CM | POA: Diagnosis not present

## 2016-02-23 DIAGNOSIS — Z0000 Encounter for general adult medical examination without abnormal findings: Secondary | ICD-10-CM

## 2016-02-23 HISTORY — DX: Essential (primary) hypertension: I10

## 2016-02-23 LAB — GLUCOSE, CAPILLARY: GLUCOSE-CAPILLARY: 90 mg/dL (ref 65–99)

## 2016-02-23 LAB — POCT GLYCOSYLATED HEMOGLOBIN (HGB A1C): Hemoglobin A1C: 5.2

## 2016-02-23 MED ORDER — HYDROCHLOROTHIAZIDE 12.5 MG PO CAPS: 12.5000 mg | ORAL_CAPSULE | Freq: Every day | ORAL | Status: DC

## 2016-02-23 NOTE — Progress Notes (Signed)
Case discussed with Dr. Gill soon after the resident saw the patient.  We reviewed the resident's history and exam and pertinent patient test results.  I agree with the assessment, diagnosis, and plan of care documented in the resident's note. 

## 2016-02-23 NOTE — Progress Notes (Signed)
Patient ID: Darrell King Lamp, male   DOB: June 12, 1966, 50 y.o.   MRN: [id number]     Subjective:   Patient ID: Darrell King Theisen male    DOB: June 12, 1966 50 y.o.    MRN: [id number] Health Maintenance Due: Health Maintenance Due  Topic Date Due  . TETANUS/TDAP  03/10/1986    _________________________________________________  HPI: [name] is a 50 y.o. male here for f/u of elevated blood pressures.  Pt has a PMH outlined below.  Please see problem-based charting assessment and plan for further status of patient's chronic medical problems addressed at today's visit.  PMH: Past Medical History  Diagnosis Date  . Asthma   . Refusal of blood transfusions as patient is Jehovah's Witness   . Anxiety     "had taken Paxil; back in 1996; nothing since" (10/21/2013)    Medications: No current outpatient prescriptions on file prior to visit.   No current facility-administered medications on file prior to visit.    Allergies: No Known Allergies  FH: Family History  Problem Relation Age of Onset  . Colon cancer Mother     colon  . Cancer Sister     dont know what type    SH: Social History   Social History  . Marital Status: Married    Spouse Name: N/A  . Number of Children: 3  . Years of Education: N/A   Occupational History  . Maintenance Tech    Social History Main Topics  . Smoking status: Never Smoker   . Smokeless tobacco: Never Used  . Alcohol Use: 0.0 oz/week    1-2 Shots of liquor per week     Comment: occasionally.  . Drug Use: No  . Sexual Activity: Not Asked   Other Topics Concern  . None   Social History Narrative    Review of Systems: Constitutional: Negative for fever, chills, +diaphoresis.  Eyes: Negative for blurred vision.  Respiratory: Negative for cough and shortness of breath.  Cardiovascular: Negative for chest pain.  Gastrointestinal: Negative for nausea, vomiting. Neurological: Negative for dizziness.   Objective:    Vital Signs: Filed Vitals:   02/23/16 0822  Temp: 97.7 F (36.5 C)  TempSrc: Oral  Height: 6\' 9"  (2.057 m)  Weight: 276 lb 8 oz (125.42 kg)      BP Readings from Last 3 Encounters:  02/02/16 134/85  11/29/14 137/95  11/16/14 134/87    Physical Exam: Constitutional: Vital signs reviewed.  Patient is in NAD and cooperative with exam.  Head: Normocephalic and atraumatic. Eyes: EOMI, conjunctivae nl, no scleral icterus.  Neck: Supple. Cardiovascular: RRR, no MRG. Pulmonary/Chest: normal effort, CTAB, no wheezes, rales, or rhonchi. Abdominal: Soft. NT/ND +BS. Neurological: A&O x3, cranial nerves II-XII are grossly intact, moving all extremities. Extremities: No LE edema. Skin: Warm, dry and intact.    Assessment & Plan:   Assessment and plan was discussed and formulated with my attending.

## 2016-02-23 NOTE — Patient Instructions (Signed)
Thank you for your visit today.   Please keep your previous scheduled appointment. I have sent hydrochlorothiazide to your pharmacy.  Please start taking this today. I will also check your labs.   I will also refer you back to Dr. Christella Hartigan for your colonoscopy.    Please be sure to bring all of your medications with you to every visit; this includes herbal supplements, vitamins, eye drops, and any over-the-counter medications.   Should you have any questions regarding your medications and/or any new or worsening symptoms, please be sure to call the clinic at [phone].   If you believe that you are suffering from a life threatening condition or one that may result in the loss of limb or function, then you should call 911 and proceed to the nearest Emergency Department.   A healthy lifestyle and preventative care can promote health and wellness.   Maintain regular health, dental, and eye exams.  Eat a healthy diet. Foods like vegetables, fruits, whole grains, low-fat dairy products, and lean protein foods contain the nutrients you need without too many calories. Decrease your intake of foods high in solid fats, added sugars, and salt. Get information about a proper diet from your caregiver, if necessary.  Regular physical exercise is one of the most important things you can do for your health. Most adults should get at least 150 minutes of moderate-intensity exercise (any activity that increases your heart rate and causes you to sweat) each week. In addition, most adults need muscle-strengthening exercises on 2 or more days a week.   Maintain a healthy weight. The body mass index (BMI) is a screening tool to identify possible weight problems. It provides an estimate of body fat based on height and weight. Your caregiver can help determine your BMI, and can help you achieve or maintain a healthy weight. For adults 20 years and older:  A BMI below 18.5 is considered underweight.  A BMI of  18.5 to 24.9 is normal.  A BMI of 25 to 29.9 is considered overweight.  A BMI of 30 and above is considered obese. DASH Eating Plan DASH stands for "Dietary Approaches to Stop Hypertension." The DASH eating plan is a healthy eating plan that has been shown to reduce high blood pressure (hypertension). Additional health benefits may include reducing the risk of type 2 diabetes mellitus, heart disease, and stroke. The DASH eating plan may also help with weight loss. WHAT DO I NEED TO KNOW ABOUT THE DASH EATING PLAN? For the DASH eating plan, you will follow these general guidelines: Choose foods with a percent daily value for sodium of less than 5% (as listed on the food label). Use salt-free seasonings or herbs instead of table salt or sea salt. Check with your health care provider or pharmacist before using salt substitutes. Eat lower-sodium products, often labeled as "lower sodium" or "no salt added." Eat fresh foods. Eat more vegetables, fruits, and low-fat dairy products. Choose whole grains. Look for the word "whole" as the first word in the ingredient list. Choose fish and skinless chicken or [place] more often than red meat. Limit fish, poultry, and meat to 6 oz (170 g) each day. Limit sweets, desserts, sugars, and sugary drinks. Choose heart-healthy fats. Limit cheese to 1 oz (28 g) per day. Eat more home-cooked food and less restaurant, buffet, and fast food. Limit fried foods. Cook foods using methods other than frying. Limit canned vegetables. If you do use them, rinse them well to decrease the sodium.  When eating at a restaurant, ask that your food be prepared with less salt, or no salt if possible. WHAT FOODS CAN I EAT? Seek help from a dietitian for individual calorie needs. Grains Whole grain or whole wheat bread. Brown rice. Whole grain or whole wheat pasta. Quinoa, bulgur, and whole grain cereals. Low-sodium cereals. Corn or whole wheat flour tortillas. Whole grain  cornbread. Whole grain crackers. Low-sodium crackers. Vegetables Fresh or frozen vegetables (raw, steamed, roasted, or grilled). Low-sodium or reduced-sodium tomato and vegetable juices. Low-sodium or reduced-sodium tomato sauce and paste. Low-sodium or reduced-sodium canned vegetables.  Fruits All fresh, canned (in natural juice), or frozen fruits. Meat and Other Protein Products Ground beef (85% or leaner), grass-fed beef, or beef trimmed of fat. Skinless chicken or [place]. Ground chicken or [place]. Pork trimmed of fat. All fish and seafood. Eggs. Dried beans, peas, or lentils. Unsalted nuts and seeds. Unsalted canned beans. Dairy Low-fat dairy products, such as skim or 1% milk, 2% or reduced-fat cheeses, low-fat ricotta or cottage cheese, or plain low-fat yogurt. Low-sodium or reduced-sodium cheeses. Fats and Oils Tub margarines without trans fats. Light or reduced-fat mayonnaise and salad dressings (reduced sodium). Avocado. Safflower, olive, or canola oils. Natural peanut or almond butter. Other Unsalted popcorn and pretzels. The items listed above may not be a complete list of recommended foods or beverages. Contact your dietitian for more options. WHAT FOODS ARE NOT RECOMMENDED? Grains White bread. White pasta. White rice. Refined cornbread. Bagels and croissants. Crackers that contain trans fat. Vegetables Creamed or fried vegetables. Vegetables in a cheese sauce. Regular canned vegetables. Regular canned tomato sauce and paste. Regular tomato and vegetable juices. Fruits Dried fruits. Canned fruit in light or heavy syrup. Fruit juice. Meat and Other Protein Products Fatty cuts of meat. Ribs, chicken wings, bacon, sausage, bologna, salami, chitterlings, fatback, hot dogs, bratwurst, and packaged luncheon meats. Salted nuts and seeds. Canned beans with salt. Dairy Whole or 2% milk, cream, half-and-half, and cream cheese. Whole-fat or sweetened yogurt. Full-fat cheeses or blue cheese.  Nondairy creamers and whipped toppings. Processed cheese, cheese spreads, or cheese curds. Condiments Onion and garlic salt, seasoned salt, table salt, and sea salt. Canned and packaged gravies. Worcestershire sauce. Tartar sauce. Barbecue sauce. Teriyaki sauce. Soy sauce, including reduced sodium. Steak sauce. Fish sauce. Oyster sauce. Cocktail sauce. Horseradish. Ketchup and mustard. Meat flavorings and tenderizers. Bouillon cubes. Hot sauce. Tabasco sauce. Marinades. Taco seasonings. Relishes. Fats and Oils Butter, stick margarine, lard, shortening, ghee, and bacon fat. Coconut, palm kernel, or palm oils. Regular salad dressings. Other Pickles and olives. Salted popcorn and pretzels. The items listed above may not be a complete list of foods and beverages to avoid. Contact your dietitian for more information. WHERE CAN I FIND MORE INFORMATION? National Heart, Lung, and Blood Institute: CableProm[employer].itwww.nhlbi.nih.gov/health/health-topics/topics/dash/   This information is not intended to replace advice given to you by your health care provider. Make sure you discuss any questions you have with your health care provider.   Document Released: 10/04/2011 Document Revised: 11/05/2014 Document Reviewed: 08/19/2013 Elsevier Interactive Patient Education [employer] Elsevier Inc.

## 2016-02-23 NOTE — Addendum Note (Signed)
Addended by: Bufford SpikesFULCHER, Gonsalo Cuthbertson N on: 02/23/2016 09:04 AM   Modules accepted: Kipp BroodSmartSet

## 2016-02-23 NOTE — Assessment & Plan Note (Addendum)
-  check BMP, lipid panel, cbc, HA1c  -refer back for colonoscopy

## 2016-02-23 NOTE — Assessment & Plan Note (Signed)
Pt presents with elevated BP at work yesterday into the 190s systolic.  Denies any chest pain, dyspnea, cough, dizziness, HA, LE swelling, N/V, changes in vision.  Does endorse getting in a heated discussion with a coworker prior to the elevated pressures.  There is a FH of HTN.  Also endorses consuming fried chicken, pizza, etc.  We discussed limiting the sodium intake.  He does not smoke.   -will start HCTZ 12.5mg  daily  -will check labs -DASH diet info provided

## 2016-02-23 NOTE — Assessment & Plan Note (Signed)
He reports no longer having any blood in stool but would like to go ahead with a colonoscopy previously recommended.   -referral back to Dr. Christella HartiganJacobs, GI

## 2016-02-24 ENCOUNTER — Encounter: Payer: Self-pay | Admitting: Internal Medicine

## 2016-02-24 LAB — CMP14 + ANION GAP
A/G RATIO: 1.5 (ref 1.2–2.2)
ALT: 29 IU/L (ref 0–44)
ANION GAP: 19 mmol/L — AB (ref 10.0–18.0)
AST: 24 IU/L (ref 0–40)
Albumin: 4.6 g/dL (ref 3.5–5.5)
Alkaline Phosphatase: 53 IU/L (ref 39–117)
BUN/Creatinine Ratio: 12 (ref 9–20)
BUN: 14 mg/dL (ref 6–24)
Bilirubin Total: 0.9 mg/dL (ref 0.0–1.2)
CALCIUM: 9.5 mg/dL (ref 8.7–10.2)
CO2: 24 mmol/L (ref 18–29)
CREATININE: 1.15 mg/dL (ref 0.76–1.27)
Chloride: 99 mmol/L (ref 96–106)
GFR calc Af Amer: 87 mL/min/{1.73_m2} (ref >59)
GFR, EST NON AFRICAN AMERICAN: 75 mL/min/{1.73_m2} (ref >59)
GLUCOSE: 90 mg/dL (ref 65–99)
Globulin, Total: 3.1 g/dL (ref 1.5–4.5)
POTASSIUM: 4.2 mmol/L (ref 3.5–5.2)
Sodium: 142 mmol/L (ref 134–144)
Total Protein: 7.7 g/dL (ref 6.0–8.5)

## 2016-02-24 LAB — CBC
HEMOGLOBIN: 16.1 g/dL (ref 12.6–17.7)
Hematocrit: 47.4 % (ref 37.5–51.0)
MCH: 28.3 pg (ref 26.6–33.0)
MCHC: 34 g/dL (ref 31.5–35.7)
MCV: 84 fL (ref 79–97)
Platelets: 206 10*3/uL (ref 150–379)
RBC: 5.68 x10E6/uL (ref 4.14–5.80)
RDW: 15 % (ref 12.3–15.4)
WBC: 5.2 10*3/uL (ref 3.4–10.8)

## 2016-03-05 ENCOUNTER — Encounter: Payer: Self-pay | Admitting: Internal Medicine

## 2016-03-05 ENCOUNTER — Ambulatory Visit (INDEPENDENT_AMBULATORY_CARE_PROVIDER_SITE_OTHER): Payer: BLUE CROSS/BLUE SHIELD | Admitting: Internal Medicine

## 2016-03-05 VITALS — BP 147/78 | HR 64 | Temp 98.0°F

## 2016-03-05 DIAGNOSIS — M7581 Other shoulder lesions, right shoulder: Secondary | ICD-10-CM | POA: Diagnosis not present

## 2016-03-05 DIAGNOSIS — I10 Essential (primary) hypertension: Secondary | ICD-10-CM

## 2016-03-05 MED ORDER — HYDROCHLOROTHIAZIDE 25 MG PO TABS: 25.0000 mg | ORAL_TABLET | Freq: Every day | ORAL | Status: DC

## 2016-03-05 NOTE — Patient Instructions (Signed)
Thank you for your visit today.   Please return to the internal medicine clinic in about 1 month to recheck your blood pressure.      I have made the following additions/changes to your medications:  I have increased the hydrochlorothiazide to  daily.  I sent this new prescription to the pharmacy but you may take 2 tabs of the 12.5mg  to equal  until you have finished your current medication.   We will be glad to refer you to sports medicine for your shoulder pain, please think about this.   I will also check your electrolytes and your cholesterol today.   Please continue to try and cut down on sodium to less than  per day.   Please be sure to bring all of your medications with you to every visit; this includes herbal supplements, vitamins, eye drops, and any over-the-counter medications.   Should you have any questions regarding your medications and/or any new or worsening symptoms, please be sure to call the clinic at [phone].   If you believe that you are suffering from a life threatening condition or one that may result in the loss of limb or function, then you should call 911 and proceed to the nearest Emergency Department.   A healthy lifestyle and preventative care can promote health and wellness.   Maintain regular health, dental, and eye exams.  Eat a healthy diet. Foods like vegetables, fruits, whole grains, low-fat dairy products, and lean protein foods contain the nutrients you need without too many calories. Decrease your intake of foods high in solid fats, added sugars, and salt. Get information about a proper diet from your caregiver, if necessary.  Regular physical exercise is one of the most important things you can do for your health. Most adults should get at least 150 minutes of moderate-intensity exercise (any activity that increases your heart rate and causes you to sweat) each week. In addition, most adults need muscle-strengthening exercises on 2 or  more days a week.   Maintain a healthy weight. The body mass index (BMI) is a screening tool to identify possible weight problems. It provides an estimate of body fat based on height and weight. Your caregiver can help determine your BMI, and can help you achieve or maintain a healthy weight. For adults 20 years and older:  A BMI below 18.5 is considered underweight.  A BMI of 18.5 to 24.9 is normal.  A BMI of 25 to 29.9 is considered overweight.  A BMI of 30 and above is considered obese.   DASH Eating Plan DASH stands for "Dietary Approaches to Stop Hypertension." The DASH eating plan is a healthy eating plan that has been shown to reduce high blood pressure (hypertension). Additional health benefits may include reducing the risk of type 2 diabetes mellitus, heart disease, and stroke. The DASH eating plan may also help with weight loss. WHAT DO I NEED TO KNOW ABOUT THE DASH EATING PLAN? For the DASH eating plan, you will follow these general guidelines:  Choose foods with a percent daily value for sodium of less than 5% (as listed on the food label).  Use salt-free seasonings or herbs instead of table salt or sea salt.  Check with your health care provider or pharmacist before using salt substitutes.  Eat lower-sodium products, often labeled as "lower sodium" or "no salt added."  Eat fresh foods.  Eat more vegetables, fruits, and low-fat dairy products.  Choose whole grains. Look for the word "whole" as the  first word in the ingredient list.  Choose fish and skinless chicken or [place]key more often than red meat. Limit fish, poultry, and meat to 6 oz (170 g) each day.  Limit sweets, desserts, sugars, and sugary drinks.  Choose heart-healthy fats.  Limit cheese to 1 oz (28 g) per day.  Eat more home-cooked food and less restaurant, buffet, and fast food.  Limit fried foods.  Cook foods using methods other than frying.  Limit canned vegetables. If you do use them, rinse  them well to decrease the sodium.  When eating at a restaurant, ask that your food be prepared with less salt, or no salt if possible. WHAT FOODS CAN I EAT? Seek help from a dietitian for individual calorie needs. Grains Whole grain or whole wheat bread. Brown rice. Whole grain or whole wheat pasta. Quinoa, bulgur, and whole grain cereals. Low-sodium cereals. Corn or whole wheat flour tortillas. Whole grain cornbread. Whole grain crackers. Low-sodium crackers. Vegetables Fresh or frozen vegetables (raw, steamed, roasted, or grilled). Low-sodium or reduced-sodium tomato and vegetable juices. Low-sodium or reduced-sodium tomato sauce and paste. Low-sodium or reduced-sodium canned vegetables.  Fruits All fresh, canned (in natural juice), or frozen fruits. Meat and Other Protein Products Ground beef (85% or leaner), grass-fed beef, or beef trimmed of fat. Skinless chicken or [place]key. Ground chicken or [place]key. Pork trimmed of fat. All fish and seafood. Eggs. Dried beans, peas, or lentils. Unsalted nuts and seeds. Unsalted canned beans. Dairy Low-fat dairy products, such as skim or 1% milk, 2% or reduced-fat cheeses, low-fat ricotta or cottage cheese, or plain low-fat yogurt. Low-sodium or reduced-sodium cheeses. Fats and Oils Tub margarines without trans fats. Light or reduced-fat mayonnaise and salad dressings (reduced sodium). Avocado. Safflower, olive, or canola oils. Natural peanut or almond butter. Other Unsalted popcorn and pretzels. The items listed above may not be a complete list of recommended foods or beverages. Contact your dietitian for more options. WHAT FOODS ARE NOT RECOMMENDED? Grains White bread. White pasta. White rice. Refined cornbread. Bagels and croissants. Crackers that contain trans fat. Vegetables Creamed or fried vegetables. Vegetables in a cheese sauce. Regular canned vegetables. Regular canned tomato sauce and paste. Regular tomato and vegetable juices. Fruits Dried  fruits. Canned fruit in light or heavy syrup. Fruit juice. Meat and Other Protein Products Fatty cuts of meat. Ribs, chicken wings, bacon, sausage, bologna, salami, chitterlings, fatback, hot dogs, bratwurst, and packaged luncheon meats. Salted nuts and seeds. Canned beans with salt. Dairy Whole or 2% milk, cream, half-and-half, and cream cheese. Whole-fat or sweetened yogurt. Full-fat cheeses or blue cheese. Nondairy creamers and whipped toppings. Processed cheese, cheese spreads, or cheese curds. Condiments Onion and garlic salt, seasoned salt, table salt, and sea salt. Canned and packaged gravies. Worcestershire sauce. Tartar sauce. Barbecue sauce. Teriyaki sauce. Soy sauce, including reduced sodium. Steak sauce. Fish sauce. Oyster sauce. Cocktail sauce. Horseradish. Ketchup and mustard. Meat flavorings and tenderizers. Bouillon cubes. Hot sauce. Tabasco sauce. Marinades. Taco seasonings. Relishes. Fats and Oils Butter, stick margarine, lard, shortening, ghee, and bacon fat. Coconut, palm kernel, or palm oils. Regular salad dressings. Other Pickles and olives. Salted popcorn and pretzels. The items listed above may not be a complete list of foods and beverages to avoid. Contact your dietitian for more information. WHERE CAN I FIND MORE INFORMATION? National Heart, Lung, and Blood Institute: CablePromo.itwww.nhlbi.nih.gov/health/health-topics/topics/dash/   This information is not intended to replace advice given to you by your health care provider. Make sure you discuss any questions you have  with your health care provider.   Document Released: 10/04/2011 Document Revised: 11/05/2014 Document Reviewed: 08/19/2013 Elsevier Interactive Patient Education [employer]

## 2016-03-05 NOTE — Progress Notes (Signed)
Patient ID: Darrell King, male   DOB: [birth year], 50 y.o.   MRN: [id number]     Subjective:   Patient ID: Darrell ShinesCharles B King male    DOB: [birth year] 50 y.o.    MRN: [id number] Health Maintenance Due: There are no preventive care reminders to display for this patient.  _________________________________________________  HPI: [name] is a 50 y.o. male here for f/u of HTN.  Pt has a PMH outlined below.  Please see problem-based charting assessment and plan for further status of patient's chronic medical problems addressed at today's visit.  PMH: Past Medical History  Diagnosis Date  . Asthma   . Refusal of blood transfusions as patient is Jehovah's Witness   . Anxiety     "had taken Paxil; back in 1996; nothing since" (10/21/2013)    Medications: No current outpatient prescriptions on file prior to visit.   No current facility-administered medications on file prior to visit.    Allergies: No Known Allergies  FH: Family History  Problem Relation Age of Onset  . Colon cancer Mother     colon  . Cancer Sister     dont know what type    SH: Social History   Social History  . Marital Status: Married    Spouse Name: N/A  . Number of Children: 3  . Years of Education: N/A   Occupational History  . Maintenance Tech    Social History Main Topics  . Smoking status: Never Smoker   . Smokeless tobacco: Never Used  . Alcohol Use: 0.0 oz/week    1-2 Shots of liquor per week     Comment: occasionally.  . Drug Use: No  . Sexual Activity: Not Asked   Other Topics Concern  . None   Social History Narrative    Review of Systems: Constitutional: Negative for fever, chills.  Eyes: Negative for blurred vision.  Respiratory: Negative for cough and shortness of breath.  Cardiovascular: Negative for chest pain.  Gastrointestinal: Negative for nausea, vomiting. Neurological: Negative for dizziness.   Objective:   Vital Signs: Filed Vitals:   03/05/16  1421  BP: 147/78  Pulse: 64  Temp: 98 F (36.7 C)  TempSrc: Oral  SpO2: 97%      BP Readings from Last 3 Encounters:  03/05/16 147/78  02/23/16 143/95  02/02/16 134/85    Physical Exam: Constitutional: Vital signs reviewed.  Patient is in NAD and cooperative with exam.  Head: Normocephalic and atraumatic. Eyes: EOMI, conjunctivae nl, no scleral icterus.  Neck: Supple. Cardiovascular: RRR, no MRG. Pulmonary/Chest: normal effort, CTAB, no wheezes, rales, or rhonchi. Abdominal: Soft. NT/ND +BS. Musculoskeletal: Decreased R shoulder ROM, including internal/external rotation and abuction to only 90 degrees.  Tender tp palpation of glenohumeral joint.  No swelling or deformity.  Neurological: A&O x3, cranial nerves II-XII are grossly intact, moving all extremities. Extremities: No LE edema. Skin: Warm, dry and intact. No rash.   Assessment & Plan:   Assessment and plan was discussed and formulated with my attending.

## 2016-03-05 NOTE — Assessment & Plan Note (Addendum)
Pt presents for recheck of BP since beginning HCTZ on last OV of 4/27.  BP today 147/78.  Denies side effects from HCTZ.  -increase HCTZ to 25mg  daily  -check BMP, lipid panel

## 2016-03-06 LAB — BMP8+ANION GAP
Anion Gap: 18 mmol/L (ref 10.0–18.0)
BUN/Creatinine Ratio: 12 (ref 9–20)
BUN: 14 mg/dL (ref 6–24)
CALCIUM: 9.8 mg/dL (ref 8.7–10.2)
CHLORIDE: 102 mmol/L (ref 96–106)
CO2: 24 mmol/L (ref 18–29)
Creatinine, Ser: 1.16 mg/dL (ref 0.76–1.27)
GFR, EST AFRICAN AMERICAN: 86 mL/min/{1.73_m2} (ref >59)
GFR, EST NON AFRICAN AMERICAN: 74 mL/min/{1.73_m2} (ref >59)
GLUCOSE: 81 mg/dL (ref 65–99)
POTASSIUM: 4.1 mmol/L (ref 3.5–5.2)
SODIUM: 144 mmol/L (ref 134–144)

## 2016-03-06 LAB — LIPID PANEL
CHOL/HDL RATIO: 6.8 ratio — AB (ref 0.0–5.0)
CHOLESTEROL TOTAL: 231 mg/dL — AB (ref 100–199)
HDL: 34 mg/dL — ABNORMAL LOW (ref >39)
LDL CALC: 149 mg/dL — AB (ref 0–99)
TRIGLYCERIDES: 238 mg/dL — AB (ref 0–149)
VLDL Cholesterol Cal: 48 mg/dL — ABNORMAL HIGH (ref 5–40)

## 2016-03-08 NOTE — Progress Notes (Signed)
Internal Medicine Clinic Attending  Case discussed with Dr. Gill soon after the resident saw the patient.  We reviewed the resident's history and exam and pertinent patient test results.  I agree with the assessment, diagnosis, and plan of care documented in the resident's note.  

## 2016-03-08 NOTE — Assessment & Plan Note (Signed)
Pt still c/o of R shoulder pain that was relieved for about 2 days with a steroid injection about 1 month ago.  He has decreased abduction, internal/external rotation on exam but no weakness.  [facility] requested an additional injection.  I explained that we were unable to do another injection at this time given that only 1 month had passed since the last injection.  He did not seem happy about this but understood.  [facility]  He takes ibuprofen PRN.  -will obtain XR of R shoulder at next OV -cont conservative measures  -he may consider [facility] referral

## 2016-03-11 ENCOUNTER — Encounter: Payer: Self-pay | Admitting: Internal Medicine

## 2016-03-11 DIAGNOSIS — E785 Hyperlipidemia, unspecified: Secondary | ICD-10-CM

## 2016-03-11 HISTORY — DX: Hyperlipidemia, unspecified: E78.5

## 2016-03-28 ENCOUNTER — Encounter: Payer: Self-pay | Admitting: Internal Medicine

## 2016-03-28 ENCOUNTER — Ambulatory Visit: Payer: BLUE CROSS/BLUE SHIELD | Admitting: Internal Medicine

## 2016-04-09 ENCOUNTER — Encounter: Payer: Self-pay | Admitting: *Deleted

## 2016-04-10 ENCOUNTER — Telehealth: Payer: Self-pay | Admitting: *Deleted

## 2016-04-10 ENCOUNTER — Ambulatory Visit: Payer: BLUE CROSS/BLUE SHIELD | Admitting: Gastroenterology

## 2016-04-10 NOTE — Telephone Encounter (Signed)
The patient did not show for his appointment today with Doug SouJessica Zehr PA-C.  Mailed him out a no show letter today 04-10-2016.

## 2016-04-10 NOTE — Addendum Note (Signed)
Addended by: Neomia DearPOWERS, Sanayah Munro E on: 04/10/2016 05:52 PM   Modules accepted: Orders, SmartSet

## 2016-05-25 ENCOUNTER — Encounter: Payer: Self-pay | Admitting: Internal Medicine

## 2016-05-25 ENCOUNTER — Ambulatory Visit: Payer: BLUE CROSS/BLUE SHIELD

## 2016-06-05 ENCOUNTER — Ambulatory Visit (INDEPENDENT_AMBULATORY_CARE_PROVIDER_SITE_OTHER): Payer: BLUE CROSS/BLUE SHIELD | Admitting: Internal Medicine

## 2016-06-05 VITALS — BP 135/85 | HR 68 | Temp 97.9°F | Ht >= 80 in | Wt 273.5 lb

## 2016-06-05 DIAGNOSIS — E785 Hyperlipidemia, unspecified: Secondary | ICD-10-CM

## 2016-06-05 DIAGNOSIS — I10 Essential (primary) hypertension: Secondary | ICD-10-CM

## 2016-06-05 DIAGNOSIS — M7541 Impingement syndrome of right shoulder: Secondary | ICD-10-CM | POA: Diagnosis not present

## 2016-06-05 MED ORDER — HYDROCHLOROTHIAZIDE 25 MG PO TABS: 25.0000 mg | ORAL_TABLET | Freq: Every day | ORAL | 11 refills | Status: DC

## 2016-06-05 MED ORDER — NAPROXEN 500 MG PO TABS: 500.0000 mg | ORAL_TABLET | Freq: Two times a day (BID) | ORAL | 1 refills | Status: DC

## 2016-06-05 MED ORDER — PRAVASTATIN SODIUM 40 MG PO TABS: 40.0000 mg | ORAL_TABLET | Freq: Every day | ORAL | 11 refills | Status: DC

## 2016-06-05 MED ORDER — CYCLOBENZAPRINE HCL 5 MG PO TABS: 5.0000 mg | ORAL_TABLET | Freq: Three times a day (TID) | ORAL | 1 refills | Status: DC | PRN

## 2016-06-05 NOTE — Progress Notes (Signed)
    CC: HTN  HPI: [name] is a 50 y.o. male with PMHx of HTN, HLD who presents to the clinic for HTN. Please see problem oriented charting for more information.  Past Medical History:  Diagnosis Date  . Anxiety    "had taken Paxil; back in 1996; nothing since" (10/21/2013)  . Asthma   . Refusal of blood transfusions as patient is Jehovah's Witness    Review of Systems: A complete ROS was negative except as noted in HPI.   Physical Exam: Vitals:   06/05/16 1459  BP: 135/85  Pulse: 68  Temp: 97.9 F (36.6 C)  TempSrc: Oral  SpO2: 99%  Weight: 273 lb 8 oz (124.1 kg)  Height: 6\' 9"  (2.057 m)   General: Vital signs reviewed.  Patient is well-developed and well-nourished, in no acute distress and cooperative with exam.  Cardiovascular: RRR, S1 normal, S2 normal, no murmurs, gallops, or rubs. Pulmonary/Chest: Clear to auscultation bilaterally, no wheezes, rales, or rhonchi. Abdominal: Soft, non-tender, non-distended, BS + Musculoskeletal: Tenderness on palpation of right shoulder. Decreased ROM in extension, flexion, abduction, internal and external rotation as compared to left upper extremity. +Neer's +Hawkin's. Normal radial pulse. Extremities: No lower extremity edema bilaterally Neurological: Strength is normal and symmetric bilaterally, sensory intact to light touch bilaterally.  Skin: Warm, dry and intact. No rashes or erythema. Psychiatric: Normal mood and affect. speech and behavior is normal. Cognition and memory are normal.   Assessment & Plan:  See encounters tab for problem based medical decision making. Patient discussed with Dr. Criselda PeachesMullen

## 2016-06-05 NOTE — Patient Instructions (Signed)
TAKE NAPROXEN AND FLEXERIL FOR PAIN.  USE THE EXERCISES FOR STRENGTHENING.  TAKE HYDROCHLOROTHIAZIDE FOR BLOOD PRESSURE.  TAKE PRAVASTATIN FOR YOUR CHOLESTEROL.  FOLLOW UP IN 3 MONTHS.

## 2016-06-05 NOTE — Assessment & Plan Note (Signed)
Patient presents with a one year history of right shoulder pain described as constant and throbbing. Pain is worse with reaching overhead or reaching behind his back. He admits to right finger numbness at times. He denies decreased strength, sensation or history of injury. He received a steroid injection in May which worked for 2 days only. Physical exam shows tenderness on palpation of right shoulder. Decreased ROM in extension, flexion, abduction, internal and external rotation as compared to left upper extremity. +Neer's +Hawkin's. Normal radial pulse. Strength is normal and symmetric bilaterally, sensory intact to light touch bilaterally.   Assessment: Impingement Syndrome. Other differentials include rotator cuff tendinopathy  Plan: -Naproxen 500 mg BID WC -Flexeril 5 mg TID prn -Provided patient with PT exercises -Follow up if not improving, consider Sports Medicine referral at that time

## 2016-06-05 NOTE — Assessment & Plan Note (Signed)
ASCVD risk score 10.7% based on recent lipid panel. Discussed statin therapy versus a trial of diet and exercise. Patient elects to start a moderate intensity statin.   Plan: -Pravastatin 40 mg daily

## 2016-06-05 NOTE — Assessment & Plan Note (Signed)
BP Readings from Last 3 Encounters:  06/05/16 135/85  03/05/16 (!) 147/78  02/23/16 (!) 143/95    Lab Results  Component Value Date   NA 144 03/05/2016   K 4.1 03/05/2016   CREATININE 1.16 03/05/2016    Assessment: Blood pressure control:  Controlled Progress toward BP goal:   At goal Comments: Patient states BP was elevated at work today to 180s. He admits he has been out of his HCTZ 25 mg daily.   Plan: Medications:  continue current medications, refilled HCTZ 25 mg daily.  Other plans: Follow up in 3 months

## 2016-06-08 NOTE — Progress Notes (Signed)
Internal Medicine Clinic Attending  Case discussed with Dr. Burns at the time of the visit.  We reviewed the resident's history and exam and pertinent patient test results.  I agree with the assessment, diagnosis, and plan of care documented in the resident's note.  

## 2016-12-31 ENCOUNTER — Ambulatory Visit (INDEPENDENT_AMBULATORY_CARE_PROVIDER_SITE_OTHER): Payer: PRIVATE HEALTH INSURANCE | Admitting: Internal Medicine

## 2016-12-31 ENCOUNTER — Encounter: Payer: Self-pay | Admitting: Internal Medicine

## 2016-12-31 VITALS — BP 143/83 | HR 67 | Temp 98.0°F | Ht >= 80 in | Wt 279.5 lb

## 2016-12-31 DIAGNOSIS — M10071 Idiopathic gout, right ankle and foot: Secondary | ICD-10-CM

## 2016-12-31 DIAGNOSIS — I10 Essential (primary) hypertension: Secondary | ICD-10-CM

## 2016-12-31 DIAGNOSIS — Z79899 Other long term (current) drug therapy: Secondary | ICD-10-CM

## 2016-12-31 DIAGNOSIS — M7541 Impingement syndrome of right shoulder: Secondary | ICD-10-CM | POA: Diagnosis not present

## 2016-12-31 DIAGNOSIS — M109 Gout, unspecified: Secondary | ICD-10-CM

## 2016-12-31 HISTORY — DX: Gout, unspecified: M10.9

## 2016-12-31 MED ORDER — LOSARTAN POTASSIUM 50 MG PO TABS: 50.0000 mg | ORAL_TABLET | Freq: Every day | ORAL | 11 refills | Status: DC

## 2016-12-31 MED ORDER — INDOMETHACIN 50 MG PO CAPS: 50.0000 mg | ORAL_CAPSULE | Freq: Three times a day (TID) | ORAL | 0 refills | Status: DC

## 2016-12-31 MED ORDER — COLCHICINE 0.6 MG PO CAPS: ORAL_CAPSULE | ORAL | 11 refills | Status: DC

## 2016-12-31 NOTE — Progress Notes (Signed)
   CC: acute right foot pain  HPI:  [name] is a 51 y.o. with past medical history as outlined below who presents to clinic for acute right foot pain. Please see problem list for further details.  Past Medical History:  Diagnosis Date  . Anxiety    "had taken Paxil; back in 1996; nothing since" (10/21/2013)  . Asthma   . Refusal of blood transfusions as patient is Jehovah's Witness     Review of Systems:  Positive for right foot pain and right shoulder pain. Denies any weakness or difficulty walking.  Physical Exam:  Vitals:   12/31/16 0942  BP: (!) 143/83  Pulse: 67  Temp: 98 F (36.7 C)  TempSrc: Oral  SpO2: 99%  Weight: 279 lb 8 oz (126.8 kg)  Height: 6\' 9"  (2.057 m)   Physical Exam  Constitutional:appears well-developed and well-nourished. No distress.  HENT:  Head: Normocephalic and atraumatic.  Nose: Nose normal.  Neurological: alert and oriented to person, place, and time.  MSK: decreased ROM of rt shoulder due to pain, pain w/ internal and external rotation of rt shoulder, pain with passive abduction but able to raise arm above shoulder height. TTP of anterior shoulder joint. Rt foot with swelling and warmth over medial mid foot where navicular bone is with TTP, full ROM of rt ankle. Rt foot warm and well perfused.  Assessment & Plan:   See Encounters Tab for problem based charting.  Patient discussed with Dr. Josem KaufmannKlima

## 2016-12-31 NOTE — Progress Notes (Signed)
Patient ID: Darrell King, male   DOB: 06-17-1966, 51 y.o.   MRN: [id number]  Case discussed with Dr. Danella Pentonruong at the time of the visit. We reviewed the resident's history and exam and pertinent patient test results. I agree with the assessment, diagnosis, and plan of care documented in the resident's note.

## 2016-12-31 NOTE — Assessment & Plan Note (Addendum)
A: Pt went bowling 1 day ago and has had worsening of his rt shoulder pain. Rt shoulder pain has been chronic over the past 2 years and is a nagging pain. 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 He has tried a steroid shoulder injection in the past the did not help. Likely he has exacerbation of his rotator cuff tendinitis from bowling vs rotator cuff tear.   P: Xray of rt shoulder ordered, can consider MRI if negative vs u/s to look for tear.

## 2016-12-31 NOTE — Assessment & Plan Note (Signed)
Assessment: Blood pressure is elevated today. Patient has not been taking HCTZ 25mg .   Plan: switch to losartan 50mg  daily. F/u in 1 month for BP check and BMET.

## 2016-12-31 NOTE — Patient Instructions (Signed)
Start taking losartan 50 mg once daily for Blood pressure.  Take colchicine 1.2 mg once then 0.6mg  an hour later. Then start taking 0.6mg  daily.   You can take indomethacin 50mg  three times daily for pain.

## 2016-12-31 NOTE — Assessment & Plan Note (Addendum)
A: Pt woke up from sleep this morning due to rt foot pain. He has not tried anything to help w/ foot pain. He rates pain as 6/10 that is a sharp, throbbing sensation. He is able to place weight on his foot. Denies recent heavy exertion or trauma. He has a hx of gout and previously on allopurinol and colchine which he does not take. 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 Pt drinks EtOH occasionally and drank 2 beers 2 days ago and his diet consists of large amts of red meat.  Symptoms are consistent w/ an acute gout flare.  P: rx for indomethacin 50mg  TID and colchine 1.2mg  once followed by 0.6mg  an hour later then 1 tab daily. F/u in 1 month to consider allopurinol ppx. Can check uric acid level at this visit. Educated on diet to prevent gout flares.

## 2017-01-01 ENCOUNTER — Telehealth: Payer: Self-pay | Admitting: Internal Medicine

## 2017-01-01 NOTE — Telephone Encounter (Signed)
Pt can return to work if he can bear weight on his foot. He was able to walk out of clinic and was informed this during his visit. I can fax a letter to excuse him for work for day of visit and the following day. Please let me know if he is okay with that and then I will write the letter and fax it to the number for [employer]. Thanks.   Dr. Danella Pentonruong

## 2017-01-01 NOTE — Telephone Encounter (Signed)
Would like to talk to nurse please call

## 2017-01-01 NOTE — Telephone Encounter (Signed)
Pt needs to know how long to stay out of work and he will need a letter for work stating how long out and what date he can return also if he will have any restrictions. It should be addressed to [employer] fax# [phone] Please advise

## 2017-01-01 NOTE — Telephone Encounter (Signed)
He states he can go back tomorrow, states he was able to put all weight on it today about lunch time so if you could cover it through today and fax letter he would be thankful

## 2017-01-02 ENCOUNTER — Telehealth: Payer: Self-pay | Admitting: Internal Medicine

## 2017-01-02 NOTE — Telephone Encounter (Signed)
Pt calling back about letter please call

## 2017-01-02 NOTE — Telephone Encounter (Signed)
Called pt, his work has now rec'd 2 copies, will mail him a copy

## 2017-02-01 ENCOUNTER — Ambulatory Visit (INDEPENDENT_AMBULATORY_CARE_PROVIDER_SITE_OTHER): Payer: PRIVATE HEALTH INSURANCE | Admitting: Internal Medicine

## 2017-02-01 ENCOUNTER — Encounter: Payer: Self-pay | Admitting: Internal Medicine

## 2017-02-01 VITALS — BP 139/87 | HR 70 | Temp 98.0°F | Ht >= 80 in | Wt 277.6 lb

## 2017-02-01 DIAGNOSIS — M1A0710 Idiopathic chronic gout, right ankle and foot, without tophus (tophi): Secondary | ICD-10-CM

## 2017-02-01 DIAGNOSIS — Z0000 Encounter for general adult medical examination without abnormal findings: Secondary | ICD-10-CM

## 2017-02-01 DIAGNOSIS — Z79899 Other long term (current) drug therapy: Secondary | ICD-10-CM

## 2017-02-01 DIAGNOSIS — M7541 Impingement syndrome of right shoulder: Secondary | ICD-10-CM

## 2017-02-01 DIAGNOSIS — M1A09X0 Idiopathic chronic gout, multiple sites, without tophus (tophi): Secondary | ICD-10-CM

## 2017-02-01 DIAGNOSIS — Z23 Encounter for immunization: Secondary | ICD-10-CM | POA: Diagnosis not present

## 2017-02-01 DIAGNOSIS — Z8739 Personal history of other diseases of the musculoskeletal system and connective tissue: Secondary | ICD-10-CM | POA: Diagnosis not present

## 2017-02-01 DIAGNOSIS — I10 Essential (primary) hypertension: Secondary | ICD-10-CM

## 2017-02-01 DIAGNOSIS — Z6829 Body mass index (BMI) 29.0-29.9, adult: Secondary | ICD-10-CM | POA: Diagnosis not present

## 2017-02-01 NOTE — Assessment & Plan Note (Signed)
BP 139/87 today, reasonably controlled. Slightly improved from last visit.   Plan: -- Continue Losartan 50 mg daily -- Advised to remain active and adhere to minimal salt diet

## 2017-02-01 NOTE — Patient Instructions (Signed)
Please continue taking your medications as prescribed. You can continue to take Tylenol or Ibuprofen for aches and pains in moderate amounts.   Avoid taking large amounts of Ibuprofen, Naproxen, Aspirin, or medications like Goody powder for long periods of time - as this can cause stomach ulcers.  We gave you a tetanus and pertussis (whooping cough) booster vaccine today.   Please return in 6 month to one year for regular check up!

## 2017-02-01 NOTE — Assessment & Plan Note (Signed)
States that his right shoulder pain has resolved. He did not obtain the XR ordered last visit. He states this is a mild, chronic problem for him that does not affect his daily life. He occasionally takes Ibuprofen which helps. He is active and works as a Curator, and also enjoys bowling.  Plan: -- Continue to monitor -- Advised to remain active and use prn Tylenol or Ibuprofen in small amounts, avoid NSAID overuse

## 2017-02-01 NOTE — Assessment & Plan Note (Addendum)
He inquired about mandatory prostate cancer screening since he is nearing 51 years old. We discussed that guidelines no longer support DREs for prostrate screening, or that there is any strong evidence for screening asymptomatic individuals. He denies urinary symptoms and has no first degree relatives with prostate cancer. He was reassured by this. We could consider checking a PSA in the future if he remains concerned about this.   Provided TDap booster this visit

## 2017-02-01 NOTE — Assessment & Plan Note (Addendum)
Pain in his right foot has resolved with 1 month of Indomethacin and Colchicine. He has been taking colchicine daily since then. He reports experiencing a few attacks of gout yearly that are usually due to dietary indiscretions (steak, etc) and resolves within 2-3 days. He feels that it does not significantly affect his work or personal life and adamantly wants to avoid additional medications. He refused lab draw to check his uric acid levels today. He is willing to continue Colchicine daily.  Plan: -- Continue Colchicine 0.6 mg daily for gout prophylaxis -- Advised to adhere to gout diet

## 2017-02-01 NOTE — Progress Notes (Signed)
   CC: Gout and right shoulder pain follow up  HPI:  [name] is a 51 y.o. male with PMHx detailed below presenting for follow up of a recent gout flare in his right foot and also right shoulder pain. He states these issues have resolved and he is in good health and active at home and work without issue.  See problem based assessment and plan below for additional details.  Past Medical History:  Diagnosis Date  . Acute gout 12/31/2016  . Anxiety    "had taken Paxil; back in 1996; nothing since" (10/21/2013)  . Asthma   . Dyslipidemia 03/11/2016   12.5% 10-year risk of heart disease or stroke, should be on a moderate/high intensity statin.    . Essential hypertension, benign 02/23/2016  . Refusal of blood transfusions as patient is Jehovah's Witness     Review of Systems: Review of Systems  Constitutional: Negative for chills, fever, malaise/fatigue and weight loss.  Respiratory: Negative for cough, shortness of breath and wheezing.   Cardiovascular: Negative for chest pain, palpitations and leg swelling.  Gastrointestinal: Positive for constipation (mild). Negative for abdominal pain, diarrhea, nausea and vomiting.  Genitourinary: Negative for dysuria, frequency, hematuria and urgency.  Musculoskeletal: Positive for joint pain (right shoulder, mild).  All other systems reviewed and are negative.    Physical Exam: Vitals:   02/01/17 1420  BP: 139/87  Pulse: 70  Temp: 98 F (36.7 C)  TempSrc: Oral  SpO2: 100%  Weight: 277 lb 9.6 oz (125.9 kg)  Height:  (2.057 m)   Body mass index is 29.75 kg/m. GENERAL- Well developed well nourished man sitting comfortably in exam room chair, alert, in no distress HEENT- Atraumatic, moist mucous membranes, good dentition CARDIAC- Regular rate and rhythm, no murmurs, rubs or gallops. RESP- Clear to ascultation bilaterally, no wheezing or crackles, normal work of breathing ABDOMEN- Soft, nontender, nondistended BACK- Normal  curvature, no paraspinal tenderness NEURO- Alert and oriented EXTREMITIES- Normal bulk and range of motion, shoulder and foot without visible abnormality or tenderness to palpation, no edema, 2+ peripheral pulses SKIN- Warm, dry, intact, no rash PSYCH- Irritable affect, clear speech, thoughts linear and goal-directed  Assessment & Plan:   See encounters tab for problem based medical decision making.  Patient discussed with Dr. Cleda Daub

## 2017-02-05 NOTE — Progress Notes (Signed)
Internal Medicine Clinic Attending  Case discussed with Dr. Johnson at the time of the visit.  We reviewed the resident's history and exam and pertinent patient test results.  I agree with the assessment, diagnosis, and plan of care documented in the resident's note.  

## 2017-04-08 ENCOUNTER — Encounter: Payer: Self-pay | Admitting: *Deleted

## 2017-12-26 ENCOUNTER — Other Ambulatory Visit: Payer: Self-pay

## 2017-12-26 ENCOUNTER — Ambulatory Visit (INDEPENDENT_AMBULATORY_CARE_PROVIDER_SITE_OTHER): Payer: 59 | Admitting: Internal Medicine

## 2017-12-26 ENCOUNTER — Encounter: Payer: Self-pay | Admitting: Internal Medicine

## 2017-12-26 VITALS — BP 124/83 | HR 78 | Temp 98.2°F | Ht >= 80 in | Wt 287.9 lb

## 2017-12-26 DIAGNOSIS — G8929 Other chronic pain: Secondary | ICD-10-CM | POA: Diagnosis not present

## 2017-12-26 DIAGNOSIS — M7521 Bicipital tendinitis, right shoulder: Secondary | ICD-10-CM | POA: Diagnosis not present

## 2017-12-26 DIAGNOSIS — M7541 Impingement syndrome of right shoulder: Secondary | ICD-10-CM

## 2017-12-26 DIAGNOSIS — M67911 Unspecified disorder of synovium and tendon, right shoulder: Secondary | ICD-10-CM

## 2017-12-26 MED ORDER — NAPROXEN 500 MG PO TABS: 500.0000 mg | ORAL_TABLET | Freq: Two times a day (BID) | ORAL | 1 refills | Status: DC

## 2017-12-26 NOTE — Patient Instructions (Signed)
[name] it was nice meeting you today.  -Take naproxen 500 mg twice daily with a meal for shoulder pain.  -Please give your shoulder rest for the next few days.  FOLLOW-UP INSTRUCTIONS When: 4-6 weeks  For: follow-up of shoulder pain What to bring: medications

## 2017-12-26 NOTE — Assessment & Plan Note (Addendum)
Patient is presenting to the clinic complaining of pain in his right shoulder.  Per chart review, he has had shoulder pain for the past 3 years and received a steroid injection in April 2017.  Patient states he had experienced relief at the time of his steroid injection.  An x-ray of the shoulder was ordered a year ago but has not been done.  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  States he previously used to like to go bowling but has not been able to do so for the past few months due to his shoulder pain. His pain is constant and 6 out of 10 in intensity.  Pain is worse at night especially when laying down on the right side. He also reports having numbness and tingling on the lateral aspect of his arm.  Paresthesias do not extend beyond the elbow.  He has normal strength in his right arm.  Reports having chronic neck muscle tightness.  He has tried taking ibuprofen and tramadol in the past but has not been on any medications for the past few months.  Bedside ultrasound findings consistent with rotator cuff tendonitis and biceps tendonitis.  Plan -Patient received a corticosteroid injection in the subacromial bursa of his right shoulder at this visit. -Advised him to give his shoulder rest for at least the next 3 days -Naproxen 500 mg twice daily with meals  Shoulder Injection Procedure Note  Pre-operative Diagnosis: right rotator tendonitis and biceps tendonitis   Indications: chronic right shoulder pain  Anesthesia: Lidocaine 1% without epinephrine without added sodium bicarbonate  Procedure Details   Point of care ultrasound was used to identify the subacromial bursa and evaluate for rotator cuff tendinopathy or tears. Consent was obtained for the procedure. The shoulder was prepped with iodine. Using a 22 gauge needle the subacromial bursa was injected with 2 mL 1% lidocaine and 40 mg of triamcinolone (KENALOG) 40mg /ml. The  needle was removed and a dressing was applied.  Complications:  None; patient tolerated the procedure well.

## 2017-12-26 NOTE — Progress Notes (Signed)
   CC: Shoulder pain  HPI:  [name] is a 52 y.o. male with a past medical history of conditions listed below presenting to the clinic complaining of pain in his right shoulder. Please see problem based charting for the status of the patient's current and chronic medical conditions.   Past Medical History:  Diagnosis Date  . Acute gout 12/31/2016  . Anxiety    "had taken Paxil; back in 1996; nothing since" (10/21/2013)  . Asthma   . Dyslipidemia 03/11/2016   12.5% 10-year risk of heart disease or stroke, should be on a moderate/high intensity statin.    . Essential hypertension, benign 02/23/2016  . Refusal of blood transfusions as patient is Jehovah's Witness    Review of Systems: Pertinent positives mentioned in HPI. Remainder of all ROS negative.   Physical Exam:  Vitals:   12/26/17 1004  BP: 124/83  Pulse: 78  Temp: 98.2 F (36.8 C)  TempSrc: Oral  SpO2: 96%  Weight: 287 lb 14.4 oz (130.6 kg)  Height: 6\' 9"  (2.057 m)   Physical Exam  Constitutional: He is oriented to person, place, and time. He appears well-developed and well-nourished. No distress.  HENT:  Head: Normocephalic and atraumatic.  Mouth/Throat: Oropharynx is clear and moist.  Eyes: Right eye exhibits no discharge. Left eye exhibits no discharge.  Neck: Normal range of motion.  C-spine nontender to palpation  Cardiovascular: Normal rate, regular rhythm and intact distal pulses.  Pulmonary/Chest: Effort normal and breath sounds normal. No respiratory distress. He has no wheezes. He has no rales.  Abdominal: Soft. Bowel sounds are normal. He exhibits no distension. There is no tenderness.  Musculoskeletal:  Right shoulder: Decreased range of motion.  Unable to abduct arm past 90 degrees.  No erythema, increased warmth, or obvious deformity.  Neurological: He is alert and oriented to person, place, and time.  Skin: Skin is warm and dry.    Assessment & Plan:   See Encounters Tab for problem  based charting.  Patient seen with Dr. Oswaldo DoneVincent

## 2017-12-27 NOTE — Progress Notes (Signed)
Internal Medicine Clinic Attending  I saw and evaluated the patient.  I personally confirmed the key portions of the history and exam documented by Dr. Loney Lohathore and I reviewed pertinent patient test results.  The assessment, diagnosis, and plan were formulated together and I agree with the documentation in the resident's note. I was present for the entirety of the procedure.   History consistent with impingement syndrome, ultrasound and exam are consistent with tendinopathy of the subscap, supraspinatous, and LHBT without tears or weakness. No glenohumeral OA seen, no joint effusion, mild amount of fluid in the subacromial bursa as below, so we went ahead with subacromial steroid injection. He had quick relief from the lidocaine component. I anticipate he will benefit from intermittent subacromial injections to treat recurrent flares.  Short axis view of the right long head biceps tendon showing peri-tendon fluid collection consistent with chronic biceps tendonitis.      Coronal view of the right shoulder showing a mild subacromial bursa fluid collection. This is the location of the steroid injection.

## 2018-01-23 ENCOUNTER — Ambulatory Visit: Payer: 59

## 2018-02-18 ENCOUNTER — Ambulatory Visit: Payer: 59

## 2018-02-18 ENCOUNTER — Encounter: Payer: Self-pay | Admitting: Internal Medicine

## 2018-03-18 ENCOUNTER — Ambulatory Visit: Payer: 59

## 2018-05-29 ENCOUNTER — Encounter: Payer: Self-pay | Admitting: Internal Medicine

## 2018-05-30 ENCOUNTER — Encounter: Payer: Self-pay | Admitting: Internal Medicine

## 2018-06-03 ENCOUNTER — Other Ambulatory Visit: Payer: Self-pay

## 2018-06-03 ENCOUNTER — Ambulatory Visit (INDEPENDENT_AMBULATORY_CARE_PROVIDER_SITE_OTHER): Payer: 59 | Admitting: Internal Medicine

## 2018-06-03 ENCOUNTER — Ambulatory Visit (HOSPITAL_COMMUNITY)
Admission: RE | Admit: 2018-06-03 | Discharge: 2018-06-03 | Disposition: A | Discharge status: Home / Self Care | Payer: 59 | Source: Ambulatory Visit | Attending: Internal Medicine | Admitting: Internal Medicine

## 2018-06-03 ENCOUNTER — Encounter: Payer: Self-pay | Admitting: Internal Medicine

## 2018-06-03 VITALS — BP 138/91 | HR 110 | Temp 98.8°F | Ht >= 80 in | Wt 290.7 lb

## 2018-06-03 DIAGNOSIS — M7541 Impingement syndrome of right shoulder: Secondary | ICD-10-CM | POA: Diagnosis not present

## 2018-06-03 DIAGNOSIS — M7521 Bicipital tendinitis, right shoulder: Secondary | ICD-10-CM

## 2018-06-03 DIAGNOSIS — M67911 Unspecified disorder of synovium and tendon, right shoulder: Secondary | ICD-10-CM

## 2018-06-03 DIAGNOSIS — G8929 Other chronic pain: Secondary | ICD-10-CM

## 2018-06-03 DIAGNOSIS — M129 Arthropathy, unspecified: Secondary | ICD-10-CM | POA: Diagnosis not present

## 2018-06-03 NOTE — Progress Notes (Signed)
   CC: Right shoulder pain follow up   HPI:  Darrell King is a 52 y.o. male with PMH listed below who presents to clinic for follow up of chronic R shoulder pain.   Chronic R shoulder pain: Patient has a 3-4 year long history of R shoulder pain secondary to chronic bicep tendonitis and impingement syndrome. He was most recently seen for this in 11/2017 at which time he received a steroid injection. Reports he did experience pain relief with it for only 2 weeks. Has been using Ibuprofen sporadically with no relief. He continues to have very limited range of motion of R shoulder which affects him on a daily basis as he is R-handed. POCUS at this visit showed fluid collection at the subacromial bursa as well as at the head of the biceps tendon suggestive of ongoing inflammation. Will obtain R shoulder XR and refer to orthopedic surgery for surgical evaluation given suboptimal pain control with medical management. Will do a shoulder steroid injection to help improve pain and function in the mean time.  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 Consent was obtained for the procedure. The shoulder was prepped with iodine. Using a 22 gauge needle the subacromial bursa was injected with 2 mL 1% lidocaine and 40 mg of triamcinolone (KENALOG) 40mg /ml. The needle was removed and a dressing was applied.  Complications:  None; patient tolerated the procedure well.    Past Medical History:  Diagnosis Date  . Acute gout 12/31/2016    . Anxiety    "had taken Paxil; back in 1996; nothing since" (10/21/2013)  . Asthma   . Dyslipidemia 03/11/2016   12.5% 10-year risk of heart disease or stroke, should be on a moderate/high intensity statin.    . Essential hypertension, benign 02/23/2016  . Refusal of blood transfusions as patient is Jehovah's Witness    Review of Systems:   Review of Systems  Constitutional: Negative for chills, fever and malaise/fatigue.  Musculoskeletal: Positive for joint pain (R shoulder pain ) and myalgias.  Neurological: Positive for focal weakness (RUE weakness ). Negative for sensory change.    Physical Exam:  Vitals:   06/03/18 1444  BP: (!) 138/91  Pulse: (!) 110  Temp: 98.8 F (37.1 C)  TempSrc: Oral  SpO2: 98%  Weight: 290 lb 11.2 oz (131.9 kg)  Height: 6\' 9"  (2.057 m)   General: young, well-developed, well-nourished male, in no acute distress  RUE: patient unable to elevate RUE above head, weakness on internal rotation, weakness and pain with empty can test, grip strength normal. No abnormalities appreciated on the LUE.   Assessment & Plan:   See Encounters Tab for problem based charting.  Patient seen with Dr. Oswaldo DoneVincent

## 2018-06-03 NOTE — Assessment & Plan Note (Signed)
Chronic R shoulder pain: Patient has a 3-4 year long history of R shoulder pain secondary to chronic bicep tendonitis and impingement syndrome. He was most recently seen for this in 11/2017 at which time he received a steroid injection. Reports he did experience pain relief with it for only 2 weeks. Has been using Ibuprofen sporadically with no relief. He continues to have very limited range of motion of R shoulder which affects him on a daily basis as he is R-handed. POCUS at this visit showed fluid collection at the subacromial bursa as well as at the head of the biceps tendon suggestive of ongoing inflammation. Will obtain R shoulder XR and refer to orthopedic surgery for surgical evaluation given suboptimal pain control with medical management. Will do a shoulder steroid injection to help improve pain and function in the mean time.  - Shoulder injection with Kenalog  - R shoulder XR  - Referral to orthopedic surgery

## 2018-06-03 NOTE — Patient Instructions (Signed)
Darrell King,  I hope the steroid injection you received today helps with the pain. I will give you a call within then next 2-3 days to see how you are doing.   I ordered a shoulder xray today. You can go to the radiology department to get this done at your convenience.   I also placed a referral to orthopedic surgery for surgical evaluation. They will call you to set up this appointment.   Please call us if you have any questions or concerns.   - Dr. Evelene CroonSantos

## 2018-06-04 ENCOUNTER — Telehealth: Payer: Self-pay

## 2018-06-04 ENCOUNTER — Other Ambulatory Visit: Payer: Self-pay | Admitting: Internal Medicine

## 2018-06-04 MED ORDER — TRAMADOL HCL 50 MG PO TABS: 50.0000 mg | ORAL_TABLET | Freq: Two times a day (BID) | ORAL | 0 refills | Status: AC | PRN

## 2018-06-04 NOTE — Telephone Encounter (Signed)
Called patient back. Shoulder steroid injection he received yesterday provided pain relief for only a few hours. He is in pain and unable to perform his duties at work. He was referred to orthopedic for surgical evaluation. Sent prescription for Tramadol 50 mg BID PRN x 5 days in the mean time as he continues to experience pain that is function-limiting. Also faxed work note to his work place.

## 2018-06-04 NOTE — Progress Notes (Signed)
Internal Medicine Clinic Attending  I saw and evaluated the patient.  I personally confirmed the key portions of the history and exam documented by Dr. Lovenia KimSantos-Sanchez and I reviewed pertinent patient test results.  The assessment, diagnosis, and plan were formulated together and I agree with the documentation in the resident's note. I was present for the entirety of the procedure.   Moderate function-limiting impingement syndrome of the right shoulder due to rotator cuff tendinopathy and long head biceps tendinopathy; no signs of tear. We tried another subacromial steroid injection today, but unfortunately it seems like he is not getting adequate relief from conservative care. Shoulder xray ruled out glenohumeral OA. Will refer to ortho to consider surgical intervention.

## 2018-06-04 NOTE — Telephone Encounter (Signed)
Requesting to speak with Dr Lovenia KimSantos-Sanchez, states he is still having pain on the shoulder. Please call pt back.

## 2018-06-04 NOTE — Telephone Encounter (Signed)
Pt is calling back, never received the letter faxed to his job. Pt is unable to leave work until he received the letter. Pt contact# [phone]

## 2018-06-05 ENCOUNTER — Ambulatory Visit: Payer: 59

## 2018-06-09 ENCOUNTER — Telehealth: Payer: Self-pay | Admitting: Internal Medicine

## 2018-06-09 NOTE — Telephone Encounter (Signed)
Pt saw Dr Evelene CroonSantos for an injection on 06/03/2018. Pt states not able to see ORTHO Dr. Until 06/16/2018. Tramadol is not working and would like to know what else he can take until he is seen.

## 2018-06-10 NOTE — Telephone Encounter (Signed)
Called patient x2 with no answer. Could you please call patient and let him know he can alternate between ibuprofen and Tylenol for pain. No indication at this time for a controlled substance for pain management. He is seeing ortho on 8/19 (next Monday).

## 2018-06-11 NOTE — Telephone Encounter (Signed)
Requesting to speak with a nurse regarding shoulder pain. Please call back.

## 2018-06-11 NOTE — Telephone Encounter (Signed)
Thanks, Rivka BarbaraGlenda! He can continue using Ibuprofen and Tylenol. Will not refill Tramadol at this time since it is not working. Discussed with Dr. Oswaldo DoneVincent who agrees with plan.

## 2018-06-11 NOTE — Telephone Encounter (Signed)
Called pt - informed to take/alternate between Ibuprofen and Tylenol for the shoulder pain until he sees [facility]d ortho on 8/19 per Dr Lovenia KimSantos-Sanchez. Stated ok and he only has 1 Tramadol left.

## 2018-06-16 ENCOUNTER — Ambulatory Visit (INDEPENDENT_AMBULATORY_CARE_PROVIDER_SITE_OTHER): Payer: 59 | Admitting: Orthopedic Surgery

## 2018-06-16 ENCOUNTER — Encounter (INDEPENDENT_AMBULATORY_CARE_PROVIDER_SITE_OTHER): Payer: Self-pay | Admitting: Orthopedic Surgery

## 2018-06-16 DIAGNOSIS — G8929 Other chronic pain: Secondary | ICD-10-CM

## 2018-06-16 DIAGNOSIS — M25511 Pain in right shoulder: Secondary | ICD-10-CM | POA: Diagnosis not present

## 2018-06-16 MED ORDER — MELOXICAM 15 MG PO TABS: ORAL_TABLET | ORAL | 0 refills | Status: DC

## 2018-06-16 MED ORDER — CYCLOBENZAPRINE HCL 5 MG PO TABS: ORAL_TABLET | ORAL | 0 refills | Status: DC

## 2018-06-16 NOTE — Progress Notes (Signed)
Office Visit Note   Patient: Darrell ShinesCharles B King           Date of Birth: May 13, 1966           MRN: [id number] Visit Date: 06/16/2018 Requested by: Burna CashSantos-Sanchez, Idalys, MD [address] [place], [place] [address] PCP: Burna CashSantos-Sanchez, Idalys, MD  Subjective: Chief Complaint  Patient presents with  . Right Shoulder - Pain    HPI: Darrell MostCharles is a patient with right shoulder pain of several years duration.  It did get worse to several weeks ago.  Went to the emergency room and he has had several injections of cortisone.  He states that he had been getting cortisone injections about every 3 to 6 months which initially helped but lately they have not been helping.  He used a ball.  Currently he is working in maintenance.  Tramadol is not helping.  Tylenol is helping some.  He has been in a sling.  He reports some neck pain and scapular pain with palm sided numbness and tingling.              ROS: All systems reviewed are negative as they relate to the chief complaint within the history of present illness.  Patient denies  fevers or chills.   Assessment & Plan: Visit Diagnoses:  1. Chronic right shoulder pain     Plan: Impression is right shoulder pain with some stiffness and some reluctance to move the shoulder for examination.  I do not detect a lot of rotator cuff weakness.  It seems like the pain really is generating from the shoulder but the neck is also a possibility.  He does have some differential paresthesias left versus right.  Symptoms have been going on now for several years worse over the past several weeks.  He has had multiple cortisone injections which have stopped working.  He has failed conservative management.  Needs MRI arthrogram right shoulder to evaluate rotator cuff tear and labrum.  If that scan is normal we may need to revisit the neck as a source of this pain.  I meant to prescribe him Flexeril and Mobic.  Come back after the scan  Follow-Up Instructions: Return for after  MRI.   Orders:  Orders Placed This Encounter  Procedures  . MR SHOULDER RIGHT W CONTRAST  . Arthrogram   Meds ordered this encounter  Medications  . cyclobenzaprine (FLEXERIL) 5 MG tablet    Sig: 1 po q 12hrs prn    Dispense:  30 tablet    Refill:  0  . meloxicam (MOBIC) 15 MG tablet    Sig: 1 po q d x 30 days    Dispense:  30 tablet    Refill:  0      Procedures: No procedures performed   Clinical Data: No additional findings.  Objective: Vital Signs: There were no vitals taken for this visit.  Physical Exam:   Constitutional: Patient appears well-developed HEENT:  Head: Normocephalic Eyes:EOM are normal Neck: Normal range of motion Cardiovascular: Normal rate Pulmonary/chest: Effort normal Neurologic: Patient is alert Skin: Skin is warm Psychiatric: Patient has normal mood and affect    Ortho Exam: Ortho exam demonstrates some reluctance to move that right shoulder.  Passive range of motion demonstrates reluctance to go above 90 degrees of forward flexion and abduction.  Cuff strength seems intact but again difficult to assess based on how much she wants to move that shoulder.  I do not detect any coarse grinding or crepitus  with passive range of motion of the shoulder.  Difficult to say if the shoulder is frozen at this time but I do want to take him out of the sling.  Specialty Comments:  No specialty comments available.  Imaging: No results found.   PMFS History: Patient Active Problem List   Diagnosis Date Noted  . Gout 12/31/2016  . Dyslipidemia 03/11/2016  . Essential hypertension, benign 02/23/2016  . 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 Atypical chest pain 11/29/2014  . Numbness and tingling of right upper extremity 11/29/2014  . Patient is Jehovah's Witness 01/04/2014  . Health care maintenance 11/24/2013  . Left inguinal hernia 11/11/2013   Past Medical History:  Diagnosis Date  . Acute gout 12/31/2016  . Anxiety    "had  taken Paxil; back in 1996; nothing since" (10/21/2013)  . Asthma   . Dyslipidemia 03/11/2016   12.5% 10-year risk of heart disease or stroke, should be on a moderate/high intensity statin.    . Essential hypertension, benign 02/23/2016  . Refusal of blood transfusions as patient is Jehovah's Witness     Family History  Problem Relation Age of Onset  . Colon cancer Mother        colon  . Cancer Sister        dont know what type    Past Surgical History:  Procedure Laterality Date  . INGUINAL HERNIA REPAIR Right 1995  . INGUINAL HERNIA REPAIR Left 11/12/2013   Procedure: HERNIA REPAIR INGUINAL INCARCERATED;  Surgeon: Adolph Pollackodd J Rosenbower, MD;  [facility];  Service: General;  Laterality: Left;   Social History   Occupational History  . Occupation: Maintenance Tech  Tobacco Use  . Smoking status: Never Smoker  . Smokeless tobacco: Never Used  Substance and Sexual Activity  . Alcohol use: Yes    Alcohol/week: 1.0 - 2.0 standard drinks    Types: 1 - 2 Shots of liquor per week    Comment: occasionally.  . Drug use: No  . Sexual activity: Not on file

## 2018-06-20 ENCOUNTER — Telehealth (INDEPENDENT_AMBULATORY_CARE_PROVIDER_SITE_OTHER): Payer: Self-pay | Admitting: Orthopedic Surgery

## 2018-06-20 NOTE — Telephone Encounter (Signed)
Patient said he is feeling much better and would like to go back to work on Tuesday 8/27 can you email him a return to work note?   email to [email or web address]  Patients # [phone]

## 2018-06-20 NOTE — Telephone Encounter (Signed)
Ok for release to return to work?

## 2018-06-21 NOTE — Telephone Encounter (Signed)
Okay for note to return to work

## 2018-06-23 ENCOUNTER — Telehealth (INDEPENDENT_AMBULATORY_CARE_PROVIDER_SITE_OTHER): Payer: Self-pay | Admitting: Orthopedic Surgery

## 2018-06-23 NOTE — Telephone Encounter (Signed)
Advised could be picked up.

## 2018-06-23 NOTE — Telephone Encounter (Signed)
Note written. Advised could pick up at front desk.  

## 2018-06-23 NOTE — Telephone Encounter (Signed)
IC patient back advised that we were trying to get him approved for MRI  But patient requesting light duty restrictions. Please advise specifically what light duty restrictions needed. Thanks.

## 2018-06-23 NOTE — Telephone Encounter (Signed)
Patient called will need minor corrections to letter sent to patient my chart. Patient stated his letter is fine just needs to specify light duty. Patient also wanted to check on follow up with one of our specialists,pertaining to pinch nerve or possible rotator cuff. Patient stated the office would check on his insurance to see if his next visit will be covered.

## 2018-06-23 NOTE — Telephone Encounter (Signed)
Okay for no lifting more than 10 pounds and no overhead lifting specific to the affected arm only.

## 2018-07-03 ENCOUNTER — Telehealth (INDEPENDENT_AMBULATORY_CARE_PROVIDER_SITE_OTHER): Payer: Self-pay | Admitting: Orthopedic Surgery

## 2018-07-03 NOTE — Telephone Encounter (Signed)
IC patient to clarify He stated he actually did not need a new note. He just wanted to make sure that when the forms are completed that the 2 weeks he missed work are on the forms.

## 2018-07-03 NOTE — Telephone Encounter (Signed)
Patient will need a extended work note for two weeks to verify he was out of work. Please call patient to verify the details of the letter I wasn't to clear on what he was requesting due to previous work note.

## 2018-07-07 ENCOUNTER — Ambulatory Visit
Admission: RE | Admit: 2018-07-07 | Discharge: 2018-07-07 | Disposition: A | Discharge status: Home / Self Care | Payer: 59 | Source: Ambulatory Visit | Attending: Orthopedic Surgery | Admitting: Orthopedic Surgery

## 2018-07-07 DIAGNOSIS — M25511 Pain in right shoulder: Principal | ICD-10-CM

## 2018-07-07 DIAGNOSIS — G8929 Other chronic pain: Secondary | ICD-10-CM

## 2018-07-07 MED ORDER — IOPAMIDOL (ISOVUE-M 200) INJECTION 41%
15.0000 mL | Freq: Once | INTRAMUSCULAR | Status: AC
  Administered 2018-07-07: 15 mL via INTRA_ARTICULAR

## 2018-07-09 ENCOUNTER — Ambulatory Visit (INDEPENDENT_AMBULATORY_CARE_PROVIDER_SITE_OTHER): Payer: 59 | Admitting: Orthopedic Surgery

## 2018-07-09 ENCOUNTER — Encounter (INDEPENDENT_AMBULATORY_CARE_PROVIDER_SITE_OTHER): Payer: Self-pay | Admitting: Orthopedic Surgery

## 2018-07-09 DIAGNOSIS — M75111 Incomplete rotator cuff tear or rupture of right shoulder, not specified as traumatic: Secondary | ICD-10-CM | POA: Diagnosis not present

## 2018-07-09 NOTE — Progress Notes (Signed)
Office Visit Note   Patient: Darrell King           Date of Birth: [birth year]           MRN: [id number] Visit Date: 07/09/2018 Requested by: Burna Cash, MD [address] [place], [place] [address] PCP: Burna Cash, MD  Subjective: Chief Complaint  Patient presents with  . Right Shoulder - Follow-up    HPI: Patient presents for follow-up of MRI scan.  He is having significant right shoulder pain.  He works in Production designer, theatre/television/film.  He is having both deltoid pain as well as some pain in the superior aspect of the shoulder.  MRI scan shows nearly complete full-thickness delaminated supraspinatus tear with some retraction on the articular side along with significant AC joint arthritis and biceps tendinitis.  He remains symptomatic.              ROS: All systems reviewed are negative as they relate to the chief complaint within the history of present illness.  Patient denies  fevers or chills.   Assessment & Plan: Visit Diagnoses:  1. Nontraumatic incomplete tear of right rotator cuff     Plan: Impression is right shoulder pain refractory to nonoperative management with significant symptoms.  He does have pathology on the MRI scan consisting of biceps tendinitis rotator cuff tearing along with AC joint arthritis.  Plan at this time would be arthroscopy with biceps tendon release AC joint resection distal clavicle along with mini open rotator cuff repair.  Risk and benefits are discussed with the patient including time out of work prolonged recovery for range of motion and strength along with potential residual pain and stiffness.  Plan to use CPM machine postop.  All questions answered  Follow-Up Instructions: No follow-ups on file.   Orders:  No orders of the defined types were placed in this encounter.  No orders of the defined types were placed in this encounter.     Procedures: No procedures performed   Clinical Data: No additional  findings.  Objective: Vital Signs: There were no vitals taken for this visit.  Physical Exam:   Constitutional: Patient appears well-developed HEENT:  Head: Normocephalic Eyes:EOM are normal Neck: Normal range of motion Cardiovascular: Normal rate Pulmonary/chest: Effort normal Neurologic: Patient is alert Skin: Skin is warm Psychiatric: Patient has normal mood and affect   Ortho Exam: Ortho exam demonstrates full active and passive range of motion of the left shoulder.  On the right-hand side the patient has generally symmetric external rotation of 15 degrees of abduction to about 45 degrees.  Does have AC joint tenderness on the right compared to the left.  He has about 20 degrees less forward flexion on the right compared to the left.  Rotator cuff strength is painful with resistive testing to external rotation strength on the right versus left.  Specialty Comments:  No specialty comments available.  Imaging: No results found.   PMFS History: Patient Active Problem List   Diagnosis Date Noted  . Gout 12/31/2016  . Dyslipidemia 03/11/2016  . Essential hypertension, benign 02/23/2016  . 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 Atypical chest pain 11/29/2014  . Numbness and tingling of right upper extremity 11/29/2014  . Patient is Jehovah's Witness 01/04/2014  . Health care maintenance 11/24/2013  . Left inguinal hernia 11/11/2013   Past Medical History:  Diagnosis Date  . Acute gout 12/31/2016  . Anxiety    "had taken Paxil; back in 1996; nothing since" (10/21/2013)  .  Asthma   . Dyslipidemia 03/11/2016   12.5% 10-year risk of heart disease or stroke, should be on a moderate/high intensity statin.    . Essential hypertension, benign 02/23/2016  . Refusal of blood transfusions as patient is Jehovah's Witness     Family History  Problem Relation Age of Onset  . Colon cancer Mother        colon  . Cancer Sister        dont know what type    Past Surgical  History:  Procedure Laterality Date  . INGUINAL HERNIA REPAIR Right 1995  . INGUINAL HERNIA REPAIR Left 11/12/2013   Procedure: HERNIA REPAIR INGUINAL INCARCERATED;  Surgeon: Adolph Pollack, MD;  Location: [facility] OR;  Service: General;  Laterality: Left;   Social History   Occupational History  . Occupation: Maintenance Tech  Tobacco Use  . Smoking status: Never Smoker  . Smokeless tobacco: Never Used  Substance and Sexual Activity  . Alcohol use: Yes    Alcohol/week: 1.0 - 2.0 standard drinks    Types: 1 - 2 Shots of liquor per week    Comment: occasionally.  . Drug use: No  . Sexual activity: Not on file

## 2018-07-11 ENCOUNTER — Other Ambulatory Visit: Payer: Self-pay

## 2018-07-11 ENCOUNTER — Other Ambulatory Visit (INDEPENDENT_AMBULATORY_CARE_PROVIDER_SITE_OTHER): Payer: Self-pay | Admitting: Orthopedic Surgery

## 2018-07-11 ENCOUNTER — Encounter (HOSPITAL_COMMUNITY): Payer: Self-pay | Admitting: *Deleted

## 2018-07-11 NOTE — Progress Notes (Signed)
   07/11/18 1243  OBSTRUCTIVE SLEEP APNEA  Have you ever been diagnosed with sleep apnea through a sleep study? No  Do you snore loudly (loud enough to be heard through closed doors)?  1  Do you often feel tired, fatigued, or sleepy during the daytime (such as falling asleep during driving or talking to someone)? 0  Has anyone observed you stop breathing during your sleep? 0  Do you have, or are you being treated for high blood pressure? 1  BMI more than 35 kg/m2? 0  Age > 50 (1-yes) 1  Neck circumference greater than:Male 16 inches or larger, Male 17inches or larger? 1  Male Gender (Yes=1) 1  Obstructive Sleep Apnea Score 5

## 2018-07-14 ENCOUNTER — Encounter (HOSPITAL_COMMUNITY): Payer: Self-pay | Admitting: *Deleted

## 2018-07-14 ENCOUNTER — Ambulatory Visit (HOSPITAL_COMMUNITY)
Admission: RE | Admit: 2018-07-14 | Discharge: 2018-07-14 | Disposition: A | Discharge status: Home / Self Care | Payer: 59 | Source: Ambulatory Visit | Attending: Orthopedic Surgery | Admitting: Orthopedic Surgery

## 2018-07-14 ENCOUNTER — Ambulatory Visit (HOSPITAL_COMMUNITY): Payer: 59 | Admitting: Certified Registered Nurse Anesthetist

## 2018-07-14 ENCOUNTER — Other Ambulatory Visit: Payer: Self-pay

## 2018-07-14 ENCOUNTER — Encounter (INDEPENDENT_AMBULATORY_CARE_PROVIDER_SITE_OTHER): Payer: Self-pay | Admitting: Orthopedic Surgery

## 2018-07-14 ENCOUNTER — Encounter (HOSPITAL_COMMUNITY)
Admission: RE | Disposition: A | Discharge status: Home / Self Care | Payer: Self-pay | Source: Ambulatory Visit | Attending: Orthopedic Surgery

## 2018-07-14 DIAGNOSIS — Z79899 Other long term (current) drug therapy: Secondary | ICD-10-CM | POA: Diagnosis not present

## 2018-07-14 DIAGNOSIS — Z791 Long term (current) use of non-steroidal anti-inflammatories (NSAID): Secondary | ICD-10-CM | POA: Diagnosis not present

## 2018-07-14 DIAGNOSIS — E785 Hyperlipidemia, unspecified: Secondary | ICD-10-CM | POA: Diagnosis not present

## 2018-07-14 DIAGNOSIS — M75101 Unspecified rotator cuff tear or rupture of right shoulder, not specified as traumatic: Secondary | ICD-10-CM | POA: Insufficient documentation

## 2018-07-14 DIAGNOSIS — M7521 Bicipital tendinitis, right shoulder: Secondary | ICD-10-CM | POA: Diagnosis not present

## 2018-07-14 DIAGNOSIS — S46011D Strain of muscle(s) and tendon(s) of the rotator cuff of right shoulder, subsequent encounter: Secondary | ICD-10-CM | POA: Diagnosis not present

## 2018-07-14 DIAGNOSIS — I10 Essential (primary) hypertension: Secondary | ICD-10-CM | POA: Insufficient documentation

## 2018-07-14 DIAGNOSIS — M25511 Pain in right shoulder: Secondary | ICD-10-CM | POA: Diagnosis present

## 2018-07-14 DIAGNOSIS — M19011 Primary osteoarthritis, right shoulder: Secondary | ICD-10-CM | POA: Insufficient documentation

## 2018-07-14 HISTORY — PX: SHOULDER ARTHROSCOPY WITH SUBACROMIAL DECOMPRESSION, ROTATOR CUFF REPAIR AND BICEP TENDON REPAIR: SHX5687

## 2018-07-14 HISTORY — DX: Unspecified osteoarthritis, unspecified site: M19.90

## 2018-07-14 LAB — HEMOGLOBIN: Hemoglobin: 15.3 g/dL (ref 13.0–17.0)

## 2018-07-14 SURGERY — SHOULDER ARTHROSCOPY WITH SUBACROMIAL DECOMPRESSION, ROTATOR CUFF REPAIR AND BICEP TENDON REPAIR
Anesthesia: General | Laterality: Right

## 2018-07-14 MED ORDER — ROCURONIUM BROMIDE 50 MG/5ML IV SOSY
PREFILLED_SYRINGE | INTRAVENOUS | Status: AC
  Filled 2018-07-14: qty 5

## 2018-07-14 MED ORDER — EPINEPHRINE PF 1 MG/ML IJ SOLN
INTRAMUSCULAR | Status: DC | PRN
  Administered 2018-07-14 (×2): 1 mg

## 2018-07-14 MED ORDER — FENTANYL CITRATE (PF) 100 MCG/2ML IJ SOLN: 25.0000 ug | INTRAMUSCULAR | Status: DC | PRN

## 2018-07-14 MED ORDER — DEXAMETHASONE SODIUM PHOSPHATE 10 MG/ML IJ SOLN
INTRAMUSCULAR | Status: AC
  Filled 2018-07-14: qty 1

## 2018-07-14 MED ORDER — BUPIVACAINE LIPOSOME 1.3 % IJ SUSP
INTRAMUSCULAR | Status: DC | PRN
  Administered 2018-07-14: 10 mL via PERINEURAL

## 2018-07-14 MED ORDER — OXYCODONE HCL 5 MG PO TABS: 5.0000 mg | ORAL_TABLET | ORAL | 0 refills | Status: DC | PRN

## 2018-07-14 MED ORDER — SUGAMMADEX SODIUM 200 MG/2ML IV SOLN
INTRAVENOUS | Status: DC | PRN
  Administered 2018-07-14: 300 mg via INTRAVENOUS

## 2018-07-14 MED ORDER — ONDANSETRON HCL 4 MG/2ML IJ SOLN
INTRAMUSCULAR | Status: AC
  Filled 2018-07-14: qty 2

## 2018-07-14 MED ORDER — LACTATED RINGERS IV SOLN
INTRAVENOUS | Status: DC | PRN
  Administered 2018-07-14 (×2): via INTRAVENOUS

## 2018-07-14 MED ORDER — MIDAZOLAM HCL 2 MG/2ML IJ SOLN
INTRAMUSCULAR | Status: AC
  Filled 2018-07-14: qty 2

## 2018-07-14 MED ORDER — MIDAZOLAM HCL 2 MG/2ML IJ SOLN
INTRAMUSCULAR | Status: DC | PRN
  Administered 2018-07-14: 2 mg via INTRAVENOUS

## 2018-07-14 MED ORDER — BUPIVACAINE HCL (PF) 0.25 % IJ SOLN
INTRAMUSCULAR | Status: AC
  Filled 2018-07-14: qty 30

## 2018-07-14 MED ORDER — LIDOCAINE 2% (20 MG/ML) 5 ML SYRINGE
INTRAMUSCULAR | Status: AC
  Filled 2018-07-14: qty 5

## 2018-07-14 MED ORDER — FENTANYL CITRATE (PF) 100 MCG/2ML IJ SOLN
INTRAMUSCULAR | Status: DC | PRN
  Administered 2018-07-14 (×5): 50 ug via INTRAVENOUS

## 2018-07-14 MED ORDER — ONDANSETRON HCL 4 MG/2ML IJ SOLN
INTRAMUSCULAR | Status: DC | PRN
  Administered 2018-07-14 (×2): 4 mg via INTRAVENOUS

## 2018-07-14 MED ORDER — OXYCODONE HCL 5 MG/5ML PO SOLN: 5.0000 mg | Freq: Once | ORAL | Status: DC | PRN

## 2018-07-14 MED ORDER — EPINEPHRINE PF 1 MG/ML IJ SOLN
INTRAMUSCULAR | Status: AC
  Filled 2018-07-14: qty 2

## 2018-07-14 MED ORDER — ONDANSETRON HCL 4 MG/2ML IJ SOLN: 4.0000 mg | Freq: Four times a day (QID) | INTRAMUSCULAR | Status: DC | PRN

## 2018-07-14 MED ORDER — FENTANYL CITRATE (PF) 250 MCG/5ML IJ SOLN
INTRAMUSCULAR | Status: AC
  Filled 2018-07-14: qty 5

## 2018-07-14 MED ORDER — DEXTROSE 5 % IV SOLN
INTRAVENOUS | Status: DC | PRN
  Administered 2018-07-14: 3 g via INTRAVENOUS

## 2018-07-14 MED ORDER — METHOCARBAMOL 500 MG PO TABS: 500.0000 mg | ORAL_TABLET | Freq: Three times a day (TID) | ORAL | 0 refills | Status: DC | PRN

## 2018-07-14 MED ORDER — CEFAZOLIN SODIUM-DEXTROSE 2-4 GM/100ML-% IV SOLN: 2.0000 g | Freq: Once | INTRAVENOUS | Status: DC

## 2018-07-14 MED ORDER — BUPIVACAINE HCL (PF) 0.5 % IJ SOLN
INTRAMUSCULAR | Status: DC | PRN
  Administered 2018-07-14: 15 mL via PERINEURAL

## 2018-07-14 MED ORDER — DEXTROSE 5 % IV SOLN
3.0000 g | Freq: Once | INTRAVENOUS | Status: DC
  Filled 2018-07-14: qty 3000

## 2018-07-14 MED ORDER — ROCURONIUM BROMIDE 50 MG/5ML IV SOSY
PREFILLED_SYRINGE | INTRAVENOUS | Status: DC | PRN
  Administered 2018-07-14: 50 mg via INTRAVENOUS

## 2018-07-14 MED ORDER — EPHEDRINE SULFATE-NACL 50-0.9 MG/10ML-% IV SOSY
PREFILLED_SYRINGE | INTRAVENOUS | Status: DC | PRN
  Administered 2018-07-14 (×3): 10 mg via INTRAVENOUS

## 2018-07-14 MED ORDER — SODIUM CHLORIDE 0.9 % IR SOLN
Status: DC | PRN
  Administered 2018-07-14 (×2): 3000 mL

## 2018-07-14 MED ORDER — OXYCODONE HCL 5 MG PO TABS: 5.0000 mg | ORAL_TABLET | Freq: Once | ORAL | Status: DC | PRN

## 2018-07-14 MED ORDER — EPHEDRINE 5 MG/ML INJ
INTRAVENOUS | Status: AC
  Filled 2018-07-14: qty 10

## 2018-07-14 MED ORDER — PROPOFOL 10 MG/ML IV BOLUS
INTRAVENOUS | Status: DC | PRN
  Administered 2018-07-14: 200 mg via INTRAVENOUS

## 2018-07-14 MED ORDER — PROPOFOL 10 MG/ML IV BOLUS
INTRAVENOUS | Status: AC
  Filled 2018-07-14: qty 40

## 2018-07-14 MED ORDER — 0.9 % SODIUM CHLORIDE (POUR BTL) OPTIME
TOPICAL | Status: DC | PRN
  Administered 2018-07-14 (×2): 1000 mL

## 2018-07-14 MED ORDER — EPINEPHRINE PF 1 MG/ML IJ SOLN
INTRAMUSCULAR | Status: AC
  Filled 2018-07-14: qty 1

## 2018-07-14 MED ORDER — DEXAMETHASONE SODIUM PHOSPHATE 10 MG/ML IJ SOLN
INTRAMUSCULAR | Status: DC | PRN
  Administered 2018-07-14: 10 mg via INTRAVENOUS

## 2018-07-14 MED ORDER — SODIUM CHLORIDE 0.9 % IV SOLN
INTRAVENOUS | Status: DC | PRN
  Administered 2018-07-14: 20 ug/min via INTRAVENOUS

## 2018-07-14 MED ORDER — SUGAMMADEX SODIUM 500 MG/5ML IV SOLN
INTRAVENOUS | Status: AC
  Filled 2018-07-14: qty 5

## 2018-07-14 SURGICAL SUPPLY — 68 items
ANCHOR SUT BIOCOMP CORKSREW (Anchor) ×2 IMPLANT
ANCHOR SUT SWIVELLOK BIO (Anchor) ×2 IMPLANT
BLADE AVERAGE 25X9 (BLADE) ×2 IMPLANT
BLADE CUTTER GATOR 3.5 (BLADE) IMPLANT
BLADE GREAT WHITE 4.2 (BLADE) IMPLANT
BLADE SURG 11 STRL SS (BLADE) ×2 IMPLANT
BUR OVAL 6.0 (BURR) IMPLANT
CLSR STERI-STRIP ANTIMIC 1/2X4 (GAUZE/BANDAGES/DRESSINGS) ×2 IMPLANT
COVER SURGICAL LIGHT HANDLE (MISCELLANEOUS) ×2 IMPLANT
DRAPE INCISE IOBAN 66X45 STRL (DRAPES) ×2 IMPLANT
DRAPE STERI 35X30 U-POUCH (DRAPES) ×2 IMPLANT
DRAPE U-SHAPE 47X51 STRL (DRAPES) ×4 IMPLANT
DRSG EMULSION OIL 3X3 NADH (GAUZE/BANDAGES/DRESSINGS) ×2 IMPLANT
DRSG TEGADERM 4X4.75 (GAUZE/BANDAGES/DRESSINGS) ×2 IMPLANT
DURAPREP 26ML APPLICATOR (WOUND CARE) ×4 IMPLANT
ELECT REM PT RETURN 9FT ADLT (ELECTROSURGICAL) ×2
ELECTRODE REM PT RTRN 9FT ADLT (ELECTROSURGICAL) ×1 IMPLANT
FILTER STRAW FLUID ASPIR (MISCELLANEOUS) ×2 IMPLANT
GAUZE SPONGE 4X4 12PLY STRL (GAUZE/BANDAGES/DRESSINGS) ×2 IMPLANT
GAUZE SPONGE 4X4 12PLY STRL LF (GAUZE/BANDAGES/DRESSINGS) ×2 IMPLANT
GLOVE BIOGEL PI IND STRL 7.5 (GLOVE) ×1 IMPLANT
GLOVE BIOGEL PI IND STRL 8 (GLOVE) ×1 IMPLANT
GLOVE BIOGEL PI INDICATOR 7.5 (GLOVE) ×1
GLOVE BIOGEL PI INDICATOR 8 (GLOVE) ×1
GLOVE ECLIPSE 7.0 STRL STRAW (GLOVE) ×2 IMPLANT
GLOVE SURG ORTHO 8.0 STRL STRW (GLOVE) ×2 IMPLANT
GOWN STRL REUS W/ TWL LRG LVL3 (GOWN DISPOSABLE) ×3 IMPLANT
GOWN STRL REUS W/TWL LRG LVL3 (GOWN DISPOSABLE) ×3
KIT BASIN OR (CUSTOM PROCEDURE TRAY) ×2 IMPLANT
KIT TURNOVER KIT B (KITS) ×2 IMPLANT
MANIFOLD NEPTUNE II (INSTRUMENTS) ×2 IMPLANT
NDL SUT 6 .5 CRC .975X.05 MAYO (NEEDLE) IMPLANT
NEEDLE HYPO 25X1 1.5 SAFETY (NEEDLE) IMPLANT
NEEDLE MAYO TAPER (NEEDLE)
NEEDLE SCORPION MULTI FIRE (NEEDLE) ×2 IMPLANT
NEEDLE SPNL 18GX3.5 QUINCKE PK (NEEDLE) ×2 IMPLANT
NS IRRIG 1000ML POUR BTL (IV SOLUTION) ×6 IMPLANT
PACK SHOULDER (CUSTOM PROCEDURE TRAY) ×2 IMPLANT
PAD ARMBOARD 7.5X6 YLW CONV (MISCELLANEOUS) ×4 IMPLANT
PUSHLOCK PEEK 4.5X24 (Orthopedic Implant) ×6 IMPLANT
RESTRAINT HEAD UNIVERSAL NS (MISCELLANEOUS) ×2 IMPLANT
SET ARTHROSCOPY TUBING (MISCELLANEOUS) ×1
SET ARTHROSCOPY TUBING LN (MISCELLANEOUS) ×1 IMPLANT
SLING ARM IMMOBILIZER LRG (SOFTGOODS) IMPLANT
SPONGE LAP 4X18 RFD (DISPOSABLE) IMPLANT
STRIP CLOSURE SKIN 1/2X4 (GAUZE/BANDAGES/DRESSINGS) ×2 IMPLANT
SUCTION FRAZIER HANDLE 10FR (MISCELLANEOUS) ×2
SUCTION TUBE FRAZIER 10FR DISP (MISCELLANEOUS) ×2 IMPLANT
SUT 2 FIBERLOOP 20 STRT BLUE (SUTURE)
SUT ETHILON 3 0 PS 1 (SUTURE) ×4 IMPLANT
SUT FIBERWIRE #2 38 T-5 BLUE (SUTURE) ×4
SUT MNCRL AB 3-0 PS2 18 (SUTURE) ×2 IMPLANT
SUT VIC AB 0 CT1 27 (SUTURE) ×1
SUT VIC AB 0 CT1 27XBRD ANBCTR (SUTURE) ×1 IMPLANT
SUT VIC AB 1 CT1 27 (SUTURE)
SUT VIC AB 1 CT1 27XBRD ANBCTR (SUTURE) IMPLANT
SUT VIC AB 2-0 CT1 27 (SUTURE) ×3
SUT VIC AB 2-0 CT1 TAPERPNT 27 (SUTURE) ×3 IMPLANT
SUT VICRYL 0 UR6 27IN ABS (SUTURE) ×2 IMPLANT
SUTURE 2 FIBERLOOP 20 STRT BLU (SUTURE) IMPLANT
SUTURE FIBERWR #2 38 T-5 BLUE (SUTURE) ×2 IMPLANT
SYR 20CC LL (SYRINGE) ×4 IMPLANT
SYR 3ML LL SCALE MARK (SYRINGE) ×2 IMPLANT
SYR TB 1ML LUER SLIP (SYRINGE) ×4 IMPLANT
TOWEL OR 17X24 6PK STRL BLUE (TOWEL DISPOSABLE) ×2 IMPLANT
TOWEL OR 17X26 10 PK STRL BLUE (TOWEL DISPOSABLE) ×2 IMPLANT
WAND HAND CNTRL MULTIVAC 90 (MISCELLANEOUS) IMPLANT
WATER STERILE IRR 1000ML POUR (IV SOLUTION) IMPLANT

## 2018-07-14 NOTE — Anesthesia Procedure Notes (Signed)
Anesthesia Regional Block: Interscalene brachial plexus block   Pre-Anesthetic Checklist: ,, timeout performed, Correct Patient, Correct Site, Correct Laterality, Correct Procedure, Correct Position, site marked, Risks and benefits discussed,  Surgical consent,  Pre-op evaluation,  At surgeon's request and post-op pain management  Laterality: Right  Prep: chloraprep       Needles:  Injection technique: Single-shot  Needle Type: Echogenic Stimulator Needle     Needle Length: 5cm  Needle Gauge: 22     Additional Needles:   Procedures:, nerve stimulator,,,,,,,   Nerve Stimulator or Paresthesia:  Response: biceps flexion, 0.45 mA,   Additional Responses:   Narrative:  Start time: 07/14/2018 7:05 AM End time: 07/14/2018 7:18 AM Injection made incrementally with aspirations every 5 mL.  Performed by: Personally  Anesthesiologist: Achille RichHodierne, Kayleigh Broadwell, MD  Additional Notes: Functioning IV was confirmed and monitors were applied.  A 50mm 22ga Arrow echogenic stimulator needle was used. Sterile prep and drape,hand hygiene and sterile gloves were used.  Negative aspiration and negative test dose prior to incremental administration of local anesthetic. The patient tolerated the procedure well.  Ultrasound guidance: relevent anatomy identified, needle position confirmed, local anesthetic spread visualized around nerve(s), vascular puncture avoided.  Image printed for medical record.

## 2018-07-14 NOTE — Telephone Encounter (Signed)
y

## 2018-07-14 NOTE — Transfer of Care (Signed)
Immediate Anesthesia Transfer of Care Note  Patient: Darrell King  Procedure(s) Performed: RIGHT SHOULDER ARTHROSCOPY, DEBRIDEMENT, BICEPS TENODESIS, DISTAL CLAVICLE EXCISION AND MINI OPEN ROTATOR CUFF TEAR REPAIR (Right )  Patient Location: PACU  Anesthesia Type:GA combined with regional for post-op pain  Level of Consciousness: awake, alert  and oriented  Airway & Oxygen Therapy: Patient Spontanous Breathing and Patient connected to face mask oxygen  Post-op Assessment: Report given to RN and Post -op Vital signs reviewed and stable  Post vital signs: Reviewed and stable  Last Vitals:  Vitals Value Taken Time  BP 141/81 07/14/2018 11:24 AM  Temp    Pulse 81 07/14/2018 11:24 AM  Resp 21 07/14/2018 11:24 AM  SpO2 93 % 07/14/2018 11:24 AM  Vitals shown include unvalidated device data.  Last Pain:  Vitals:   07/14/18 0631  TempSrc:   PainSc: 5       Patients Stated Pain Goal: 0 (07/14/18 0631)  Complications: No apparent anesthesia complications

## 2018-07-14 NOTE — H&P (Signed)
Darrell King is an 52 y.o. male.   Chief Complaint: Right shoulder pain HPI: Darrell King is a 52 year old patient with right shoulder pain.  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  Localizes the pain in the deltoid region as well as the superior aspect of the shoulder.  MRI scan shows nearly full-thickness delaminated rotator cuff tear along with some biceps tendinitis and AC joint arthritis.  He presents now for operative management after failure of conservative management and high likelihood of progress of this rotator cuff tear to a full-thickness tear.  Past Medical History:  Diagnosis Date  . Acute gout 12/31/2016  . Anxiety    "had taken Paxil; back in 1996; nothing since" (10/21/2013)  . Arthritis   . Asthma    asa child  . Dyslipidemia 03/11/2016   12.5% 10-year risk of heart disease or stroke, should be on a moderate/high intensity statin.    . Essential hypertension, benign 02/23/2016  . Refusal of blood transfusions as patient is Jehovah's Witness     Past Surgical History:  Procedure Laterality Date  . COLONOSCOPY W/ POLYPECTOMY    . INGUINAL HERNIA REPAIR Right 1995  . INGUINAL HERNIA REPAIR Left 11/12/2013   Procedure: HERNIA REPAIR INGUINAL INCARCERATED;  Surgeon: Adolph Pollack, MD;  Location: [facility] OR;  Service: General;  Laterality: Left;    Family History  Problem Relation Age of Onset  . Colon cancer Mother        colon  . Cancer Sister        dont know what type   Social History:  reports that he has never smoked. He has never used smokeless tobacco. He reports that he drinks about 1.0 - 2.0 standard drinks of alcohol per week. He reports that he does not use drugs.  Allergies: No Known Allergies  Medications Prior to Admission  Medication Sig Dispense Refill  . Colchicine 0.6 MG CAPS Take 1.2mg  once, then 1 hr later take 0.6mg . Then take 1 tab daily (Patient taking differently: Take 0.6 mg by mouth daily. ) 35 capsule 11   . cyclobenzaprine (FLEXERIL) 5 MG tablet 1 po q 12hrs prn (Patient taking differently: Take 5 mg by mouth every 12 (twelve) hours. ) 30 tablet 0  . ibuprofen (ADVIL,MOTRIN) 200 MG tablet Take 200 mg by mouth every 6 (six) hours as needed.    Marland Kitchen losartan (COZAAR) 50 MG tablet Take 1 tablet (50 mg total) by mouth daily. 30 tablet 11  . meloxicam (MOBIC) 15 MG tablet 1 po q d x 30 days (Patient taking differently: Take 15 mg by mouth daily. ) 30 tablet 0  . naproxen (NAPROSYN) 500 MG tablet Take 1 tablet (500 mg total) by mouth 2 (two) times daily with a meal. 60 tablet 1  . pravastatin (PRAVACHOL) 40 MG tablet Take 1 tablet (40 mg total) by mouth daily. 30 tablet 11    Results for orders placed or performed during the hospital encounter of 07/14/18 (from the past 48 hour(s))  Hemoglobin     Status: None   Collection Time: 07/14/18  6:35 AM  Result Value Ref Range   Hemoglobin 15.3 13.0 - 17.0 g/dL    Comment: Performed at [facility], [address] [place], [place] [address]   No results found.  Review of Systems  Musculoskeletal: Positive for joint pain.  All other systems reviewed and are negative.   Blood pressure (!) 150/114, pulse 64, temperature 97.9  F (36.6 C), temperature source Oral, resp. rate 20, height 6\' 9"  (2.057 m), weight 131.5 kg, SpO2 99 %. Physical Exam  Constitutional: He appears well-developed.  HENT:  Head: Normocephalic.  Eyes: Pupils are equal, round, and reactive to light.  Neck: Normal range of motion.  Cardiovascular: Normal rate.  Respiratory: Effort normal.  Neurological: He is alert.  Skin: Skin is warm.  Psychiatric: He has a normal mood and affect.  Examination of the right shoulder demonstrates maintenance of passive external rotation symmetrically between the right and left shoulder.  Patient does have a little bit of coarseness with passive range of motion on the right-hand side at 90 degrees of abduction with internal and external  rotation.  Does have some tenderness to palpation at the superior aspect of the shoulder near the [facility] joint on the right but not on the left.  There is some pain with crossarm adduction and equivocal O'Brien's testing on the right negative on the left  Assessment/Plan Impression is right shoulder rotator cuff tear AC joint arthritis and likely biceps tendinitis.  Plan is right shoulder arthroscopy with evaluation of the biceps tendon and superior labrum with possible biceps tenodesis followed by subacromial decompression distal clavicle excision mini open rotator cuff tear repair and possible biceps tenodesis.  Risk and benefits are discussed including but not limited to infection nerve vessel damage incomplete pain relief as well as shoulder stiffness.  Time out of work would be at least 2 months based on the physical nature of his work.  Burnard BuntingG Scott Yeira Gulden, MD 07/14/2018, 7:25 AM

## 2018-07-14 NOTE — Telephone Encounter (Signed)
Rx requests 

## 2018-07-14 NOTE — Op Note (Signed)
NAME: Dowding, Joss B. MEDICAL RECORD [id number] ACCOUNT [id number] DATE OF BIRTH:05/1[name]3/1967 FACILITY: [facility] LOCATION: [facility] PHYSICIAN:Messi Twedt Diamantina ProvidenceS. Nithila Sumners, MD  OPERATIVE REPORT  DATE OF PROCEDURE:  07/14/2018  PREOPERATIVE DIAGNOSIS:  Right shoulder rotator cuff tear, biceps tendinitis and acromioclavicular joint arthritis.  POSTOPERATIVE DIAGNOSIS:  Right shoulder rotator cuff tear, biceps tendinitis and acromioclavicular joint arthritis.  PROCEDURE:  Right shoulder arthroscopy with biceps tendon release and debridement of the superior labrum followed by subacromial decompression, mini open rotator cuff tear repair, AC joint resection and biceps tenodesis.  SURGEON:  Cammy CopaGregory Scott Yardley Beltran, MD  ASSISTANT:  Melvyn NethJustin Clean, RNFA.  INDICATIONS:  This is a patient with right shoulder pain.  He has significant biceps tendinitis and AC joint arthritis and rotator cuff tear.  He presents for operative management after explanation of risks and benefits.  PROCEDURE IN DETAIL:  The patient was brought to the operating room where general anesthetic was induced.  Preoperative antibiotics administered.  Timeout was called.  Right shoulder prescrubbed with alcohol, Betadine and allowed to air dry.  Prep with  DuraPrep solution and draped in a sterile manner.  Ioban used to cover the operative field.  Timeout was called.  Posterior portal created 2 cm medial and inferior to the posterolateral margin of the acromion.  Diagnostic arthroscopy was performed.  The  patient had significant biceps tendinitis and delaminated rotator cuff tear involving 90% thickness of the rotator cuff.  Through an anterior portal, the biceps tendon was released.  Superior labrum was debrided.  Articular surfaces were intact.   Anterior, inferior and posterior inferior labrum also intact.  At this time, following debridement and biceps tendon release, the scope was placed in the subacromial space.  Lateral portal was created.   Bursectomy performed.  Initially arthroscopic  distal clavicle excision was initiated, but because of the patient's significant size and blood pressure issues, a clear field could not be obtained.  Instruments were removed from those portals, which were then closed using  3-0 nylon suture.  Incision  made over the distal clavicle.  Skin and subcutaneous tissue were sharply divided.  A 2.5 cm incision was made and then the distal clavicle was excised approximately 9 mm.  Thorough complete symmetric decompression of that [facility] joint space was achieved.   Thorough irrigation was performed and then the periosteal sleeve was closed over the distal end of the clavicle.  The arm was taken through a range of motion.  No impingement was noted.  Following this, the incision was made off the anterolateral margin  of the acromion.  Skin and subcutaneous tissue were sharply divided.  Deltoid split a measured distance of 4 cm from the margin.  At this time, the biceps tendon was tenodesed using an Arthrex interference screw 7 mm into the bicipital groove.   Appropriate tension was achieved.  Rotator cuff tear was then visualized.  It was repaired using 2 corkscrews and 2 PushLock suture anchors.  The tear measured 2 x 1.5 cm.  Secure fixation was achieved.  Thorough irrigation was then performed and the  deltoid split was closed using 0 Vicryl suture followed by interrupted inverted 2-0 Vicryl suture and 3-0 Monocryl.  Monocryl was also used to close the superior aspect of the distal clavicle excision.  Impervious dressings and a shoulder immobilizer  placed.  The patient tolerated the procedure well without immediate complications.  He was transferred to the recovery room in stable condition.  TN/NUANCE  D:07/14/2018 T:07/14/2018 JOB:[id number]

## 2018-07-14 NOTE — Anesthesia Procedure Notes (Signed)
Procedure Name: Intubation Date/Time: 07/14/2018 7:44 AM Performed by: Pearson Grippeobertson, Estellar Cadena M, CRNA Pre-anesthesia Checklist: Patient identified, Emergency Drugs available, Suction available and Patient being monitored Patient Re-evaluated:Patient Re-evaluated prior to induction Oxygen Delivery Method: Circle system utilized Preoxygenation: Pre-oxygenation with 100% oxygen Induction Type: IV induction Ventilation: Mask ventilation without difficulty and Oral airway inserted - appropriate to patient size Laryngoscope Size: Hyacinth MeekerMiller and 2 Grade View: Grade I Tube type: Oral Tube size: 7.5 mm Number of attempts: 1 Airway Equipment and Method: Stylet and Oral airway Placement Confirmation: ETT inserted through vocal cords under direct vision,  positive ETCO2 and breath sounds checked- equal and bilateral Secured at: 23 cm Tube secured with: Tape Dental Injury: Teeth and Oropharynx as per pre-operative assessment

## 2018-07-14 NOTE — Anesthesia Preprocedure Evaluation (Signed)
Anesthesia Evaluation  Patient identified by MRN, date of birth, ID band Patient awake    Reviewed: Allergy & Precautions, H&P , NPO status , [name] Chart, lab work & pertinent test results  Airway Mallampati: II   Neck ROM: full    Dental   Pulmonary asthma ,    breath sounds clear to auscultation       Cardiovascular hypertension,  Rhythm:regular Rate:Normal     Neuro/Psych PSYCHIATRIC DISORDERS Anxiety    GI/Hepatic   Endo/Other    Renal/GU      Musculoskeletal  (+) Arthritis ,   Abdominal   Peds  Hematology  (+) REFUSES BLOOD PRODUCTS, JEHOVAH'S WITNESS  Anesthesia Other Findings   Reproductive/Obstetrics                             Anesthesia Physical Anesthesia Plan  ASA: II  Anesthesia Plan: General   Post-op Pain Management:  Regional for Post-op pain   Induction: Intravenous  PONV Risk Score and Plan: 2 and Ondansetron, Dexamethasone, Midazolam and Treatment may vary due to age or medical condition  Airway Management Planned: Oral ETT  Additional Equipment:   Intra-op Plan:   Post-operative Plan: Extubation in OR  Informed Consent: I have reviewed the patients History and Physical, chart, labs and discussed the procedure including the risks, benefits and alternatives for the proposed anesthesia with the patient or authorized representative who has indicated his/her understanding and acceptance.     Plan Discussed with: CRNA, Anesthesiologist and Surgeon  Anesthesia Plan Comments:         Anesthesia Quick Evaluation

## 2018-07-14 NOTE — Brief Op Note (Signed)
07/14/2018  10:57 AM  PATIENT:  Jearld Shinesharles B Lamagna  [age] y.o. male  PRE-OPERATIVE DIAGNOSIS:  r right shoulder AC joint arthritis rotator cuff tear and biceps tendinitis POST-OPERATIVE DIAGNOSIS: Same PROCEDURE:  Procedure(s): RIGHT SHOULDER ARTHROSCOPY, DEBRIDEMENT, BICEPS TENODESIS, DISTAL CLAVICLE EXCISION AND MINI OPEN ROTATOR CUFF TEAR REPAIR  SURGEON:  Surgeon(s): August Saucerean, Corrie MckusickGregory Scott, MD  ASSISTANT: Hart CarwinJustin Queen RNFA  ANESTHESIA:   general  EBL: 25 ml    Total I/O In: 1000 [I.V.:1000] Out: 200 [Blood:200]  BLOOD ADMINISTERED: none  DRAINS: none   LOCAL MEDICATIONS USED:  none  SPECIMEN:  No Specimen  COUNTS:  YES  TOURNIQUET:  * No tourniquets in log *  DICTATION: .Other Dictation: Dictation Number [id number]  PLAN OF CARE: Discharge to home after PACU  PATIENT DISPOSITION:  PACU - hemodynamically stable

## 2018-07-15 MED ORDER — MELOXICAM 15 MG PO TABS: ORAL_TABLET | ORAL | 0 refills | Status: DC

## 2018-07-15 MED ORDER — CYCLOBENZAPRINE HCL 5 MG PO TABS: ORAL_TABLET | ORAL | 0 refills | Status: DC

## 2018-07-15 NOTE — Anesthesia Postprocedure Evaluation (Signed)
Anesthesia Post Note  Patient: Darrell ShinesCharles B Sokolowski  Procedure(s) Performed: RIGHT SHOULDER ARTHROSCOPY, DEBRIDEMENT, BICEPS TENODESIS, DISTAL CLAVICLE EXCISION AND MINI OPEN ROTATOR CUFF TEAR REPAIR (Right )     Patient location during evaluation: PACU Anesthesia Type: General Level of consciousness: awake and alert Pain management: pain level controlled Vital Signs Assessment: post-procedure vital signs reviewed and stable Respiratory status: spontaneous breathing, nonlabored ventilation, respiratory function stable and patient connected to nasal cannula oxygen Cardiovascular status: blood pressure returned to baseline and stable Postop Assessment: no apparent nausea or vomiting Anesthetic complications: no    Last Vitals:  Vitals:   07/14/18 1300 07/14/18 1315  BP: 135/85 125/82  Pulse: 76   Resp: 16   Temp:    SpO2: 93%     Last Pain:  Vitals:   07/14/18 1315  TempSrc:   PainSc: 0-No pain                 Brittian Renaldo S

## 2018-07-16 ENCOUNTER — Encounter (HOSPITAL_COMMUNITY): Payer: Self-pay | Admitting: Orthopedic Surgery

## 2018-07-23 ENCOUNTER — Encounter (INDEPENDENT_AMBULATORY_CARE_PROVIDER_SITE_OTHER): Payer: Self-pay | Admitting: Orthopedic Surgery

## 2018-07-23 ENCOUNTER — Ambulatory Visit (INDEPENDENT_AMBULATORY_CARE_PROVIDER_SITE_OTHER): Payer: 59 | Admitting: Orthopedic Surgery

## 2018-07-23 DIAGNOSIS — M75111 Incomplete rotator cuff tear or rupture of right shoulder, not specified as traumatic: Secondary | ICD-10-CM

## 2018-07-23 MED ORDER — OXYCODONE HCL 5 MG PO TABS: ORAL_TABLET | ORAL | 0 refills | Status: DC

## 2018-07-23 NOTE — Progress Notes (Signed)
Post-Op Visit Note   Patient: Darrell King           Date of Birth: January 15, 1966           MRN: [id number] Visit Date: 07/23/2018 PCP: Burna Cash, MD   Assessment & Plan:  Chief Complaint:  Chief Complaint  Patient presents with  . Right Shoulder - Follow-up   Visit Diagnoses:  1. Nontraumatic incomplete tear of right rotator cuff     Plan: [name] is now 9 days out right shoulder arthroscopy distal clavicle excision biceps tenodesis and rotator cuff repair.  He is been doing reasonably well.  On examination he is got good passive range of motion.  Does have some tingling on the superior aspect of his shoulder where the distal clavicle was excised.  Incisions intact.  Able to refill his oxycodone and have him continue in the CPM machine going past 90 as tolerated.  2-week return to start physical therapy at that time.  Follow-Up Instructions: Return in about 2 weeks (around 08/06/2018).   Orders:  No orders of the defined types were placed in this encounter.  Meds ordered this encounter  Medications  . oxyCODONE (OXY IR/ROXICODONE) 5 MG immediate release tablet    Sig: 1 po q 6 hrs prn pain    Dispense:  45 tablet    Refill:  0    Imaging: No results found.  PMFS History: Patient Active Problem List   Diagnosis Date Noted  . Gout 12/31/2016  . Dyslipidemia 03/11/2016  . Essential hypertension, benign 02/23/2016  . 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 Atypical chest pain 11/29/2014  . Numbness and tingling of right upper extremity 11/29/2014  . Patient is Jehovah's Witness 01/04/2014  . Health care maintenance 11/24/2013  . Left inguinal hernia 11/11/2013   Past Medical History:  Diagnosis Date  . Acute gout 12/31/2016  . Anxiety    "had taken Paxil; back in 1996; nothing since" (10/21/2013)  . Arthritis   . Asthma    asa child  . Dyslipidemia 03/11/2016   12.5% 10-year risk of heart disease or stroke, should be on a moderate/high  intensity statin.    . Essential hypertension, benign 02/23/2016  . Refusal of blood transfusions as patient is Jehovah's Witness     Family History  Problem Relation Age of Onset  . Colon cancer Mother        colon  . Cancer Sister        dont know what type    Past Surgical History:  Procedure Laterality Date  . COLONOSCOPY W/ POLYPECTOMY    . INGUINAL HERNIA REPAIR Right 1995  . INGUINAL HERNIA REPAIR Left 11/12/2013   Procedure: HERNIA REPAIR INGUINAL INCARCERATED;  Surgeon: Adolph Pollack, MD;  Location[facility] OR;  Service: General;  Laterality: Left;  . SHOULDER ARTHROSCOPY WITH SUBACROMIAL DECOMPRESSION, ROTATOR CUFF REPAIR AND BICEP TENDON REPAIR Right 07/14/2018   Procedure: RIGHT SHOULDER ARTHROSCOPY, DEBRIDEMENT, BICEPS TENODESIS, DISTAL CLAVICLE EXCISION AND MINI OPEN ROTATOR CUFF TEAR REPAIR;  Surgeon: Cammy Copa, MD;  Location: [facility] OR;  Service: Orthopedics;  Laterality: Right;   Social History   Occupational History  . Occupation: Maintenance Tech  Tobacco Use  . Smoking status: Never Smoker  . Smokeless tobacco: Never Used  Substance and Sexual Activity  . Alcohol use: Yes    Alcohol/week: 1.0 - 2.0 standard drinks    Types: 1 - 2 Shots of liquor per week    Comment: occasionally.  Marland Kitchen  Drug use: No  . Sexual activity: Not on file

## 2018-07-24 ENCOUNTER — Telehealth (INDEPENDENT_AMBULATORY_CARE_PROVIDER_SITE_OTHER): Payer: Self-pay

## 2018-07-24 NOTE — Telephone Encounter (Signed)
IC s/w pharmacist and provided diagnosis code.

## 2018-07-24 NOTE — Telephone Encounter (Signed)
Received voicemail on triage phone from [facility] stating they needed diagnosis code for patients oxycodone prescription.  I tried calling several times to number provided on voicemail and it is busy. Will continue to try calling [phone] to provide code.

## 2018-07-31 ENCOUNTER — Encounter (INDEPENDENT_AMBULATORY_CARE_PROVIDER_SITE_OTHER): Payer: Self-pay

## 2018-07-31 ENCOUNTER — Telehealth (INDEPENDENT_AMBULATORY_CARE_PROVIDER_SITE_OTHER): Payer: Self-pay | Admitting: Orthopedic Surgery

## 2018-07-31 NOTE — Telephone Encounter (Signed)
Ok for note 

## 2018-07-31 NOTE — Telephone Encounter (Signed)
IC advised could pick up at front desk.  

## 2018-07-31 NOTE — Telephone Encounter (Signed)
y

## 2018-07-31 NOTE — Telephone Encounter (Signed)
Patient called asked if he can get a note for his employer stating he will be out of work for 6 to 8 weeks. Patient said he will pick up the note. The number to contact patient is [phone]

## 2018-08-06 ENCOUNTER — Ambulatory Visit (INDEPENDENT_AMBULATORY_CARE_PROVIDER_SITE_OTHER): Payer: 59 | Admitting: Orthopedic Surgery

## 2018-08-06 ENCOUNTER — Encounter (INDEPENDENT_AMBULATORY_CARE_PROVIDER_SITE_OTHER): Payer: Self-pay | Admitting: Orthopedic Surgery

## 2018-08-06 DIAGNOSIS — M75121 Complete rotator cuff tear or rupture of right shoulder, not specified as traumatic: Secondary | ICD-10-CM

## 2018-08-06 NOTE — Progress Notes (Signed)
Post-Op Visit Note   Patient: Darrell King           Date of Birth: 1966-05-08           MRN: [id number] Visit Date: 08/06/2018 PCP: Burna Cash, MD   Assessment & Plan:  Chief Complaint:  Chief Complaint  Patient presents with  . Right Shoulder - Routine Post Op   Visit Diagnoses:  1. Nontraumatic complete tear of right rotator cuff     Plan: Rod is a patient who is now about 3 weeks out right shoulder rotator cuff repair and distal clavicle excision.  He is been in the brace up to 90 degrees.  He is out of the sling at this time.  Hard for him to sleep at times.  Taking oxycodone and muscle relaxer.  On examination he has decent range of motion of that right shoulder passively.  I want him to start physical therapy.  He will need to be out of work at least 8 more weeks.  I will see him back in 5 weeks for clinical recheck.  Follow-Up Instructions: Return in about 5 weeks (around 09/10/2018).   Orders:  No orders of the defined types were placed in this encounter.  No orders of the defined types were placed in this encounter.   Imaging: No results found.  PMFS History: Patient Active Problem List   Diagnosis Date Noted  . Gout 12/31/2016  . Dyslipidemia 03/11/2016  . Essential hypertension, benign 02/23/2016  . 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 Atypical chest pain 11/29/2014  . Numbness and tingling of right upper extremity 11/29/2014  . Patient is Jehovah's Witness 01/04/2014  . Health care maintenance 11/24/2013  . Left inguinal hernia 11/11/2013   Past Medical History:  Diagnosis Date  . Acute gout 12/31/2016  . Anxiety    "had taken Paxil; back in 1996; nothing since" (10/21/2013)  . Arthritis   . Asthma    asa child  . Dyslipidemia 03/11/2016   12.5% 10-year risk of heart disease or stroke, should be on a moderate/high intensity statin.    . Essential hypertension, benign 02/23/2016  . Refusal of blood transfusions as  patient is Jehovah's Witness     Family History  Problem Relation Age of Onset  . Colon cancer Mother        colon  . Cancer Sister        dont know what type    Past Surgical History:  Procedure Laterality Date  . COLONOSCOPY W/ POLYPECTOMY    . INGUINAL HERNIA REPAIR Right 1995  . INGUINAL HERNIA REPAIR Left 11/12/2013   Procedure: HERNIA REPAIR INGUINAL INCARCERATED;  Surgeon: Adolph Pollack, MD;  Location: [facility] OR;  Service: General;  Laterality: Left;  . SHOULDER ARTHROSCOPY WITH SUBACROMIAL DECOMPRESSION, ROTATOR CUFF REPAIR AND BICEP TENDON REPAIR Right 07/14/2018   Procedure: RIGHT SHOULDER ARTHROSCOPY, DEBRIDEMENT, BICEPS TENODESIS, DISTAL CLAVICLE EXCISION AND MINI OPEN ROTATOR CUFF TEAR REPAIR;  Surgeon: Cammy Copa, MD;  Location: [facility] OR;  Service: Orthopedics;  Laterality: Right;   Social History   Occupational History  . Occupation: Maintenance Tech  Tobacco Use  . Smoking status: Never Smoker  . Smokeless tobacco: Never Used  Substance and Sexual Activity  . Alcohol use: Yes    Alcohol/week: 1.0 - 2.0 standard drinks    Types: 1 - 2 Shots of liquor per week    Comment: occasionally.  . Drug use: No  . Sexual activity: Not on  file

## 2018-08-11 ENCOUNTER — Telehealth (INDEPENDENT_AMBULATORY_CARE_PROVIDER_SITE_OTHER): Payer: Self-pay

## 2018-08-11 NOTE — Telephone Encounter (Signed)
Christina from [facility] called needing Op note to be faxed to her. This has been done. Just an FYI to you.  Faxed to [phone]

## 2018-09-10 ENCOUNTER — Ambulatory Visit (INDEPENDENT_AMBULATORY_CARE_PROVIDER_SITE_OTHER): Payer: 59 | Admitting: Orthopedic Surgery

## 2018-09-10 ENCOUNTER — Encounter (INDEPENDENT_AMBULATORY_CARE_PROVIDER_SITE_OTHER): Payer: Self-pay | Admitting: Orthopedic Surgery

## 2018-09-10 DIAGNOSIS — M75121 Complete rotator cuff tear or rupture of right shoulder, not specified as traumatic: Secondary | ICD-10-CM

## 2018-09-10 NOTE — Progress Notes (Signed)
Post-Op Visit Note   Patient: Darrell ShinesCharles B King           Date of Birth: August 10, 1966           MRN: [id number] Visit Date: 09/10/2018 PCP: Burna CashSantos-Sanchez, Idalys, MD   Assessment & Plan:  Chief Complaint:  Chief Complaint  Patient presents with  . Right Shoulder - Follow-up   Visit Diagnoses:  1. Nontraumatic complete tear of right rotator cuff     Plan: Darrell MostCharles is now 2 months out right shoulder arthroscopy biceps tendon tenodesis distal clavicle excision and rotator cuff repair.  He generally is doing better.  He would like to return to work.  He does work in Production designer, theatre/television/filmmaintenance.  On exam he does have improving range of motion and good strength with no coarse grinding or crepitus with passive range of motion.  Overall his shoulder looks significantly better than last clinic visit.  I think is fine to return to work but I do want him to avoid lifting out away from his body.  He can keep the strain on the biceps and deltoid more than the cuff of think it would be better.  I will see him back as needed.  Follow-Up Instructions: Return if symptoms worsen or fail to improve.   Orders:  No orders of the defined types were placed in this encounter.  No orders of the defined types were placed in this encounter.   Imaging: No results found.  PMFS History: Patient Active Problem List   Diagnosis Date Noted  . Gout 12/31/2016  . Dyslipidemia 03/11/2016  . Essential hypertension, benign 02/23/2016  . 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 Atypical chest pain 11/29/2014  . Numbness and tingling of right upper extremity 11/29/2014  . Patient is Jehovah's Witness 01/04/2014  . Health care maintenance 11/24/2013  . Left inguinal hernia 11/11/2013   Past Medical History:  Diagnosis Date  . Acute gout 12/31/2016  . Anxiety    "had taken Paxil; back in 1996; nothing since" (10/21/2013)  . Arthritis   . Asthma    asa child  . Dyslipidemia 03/11/2016   12.5% 10-year risk of heart  disease or stroke, should be on a moderate/high intensity statin.    . Essential hypertension, benign 02/23/2016  . Refusal of blood transfusions as patient is Jehovah's Witness     Family History  Problem Relation Age of Onset  . Colon cancer Mother        colon  . Cancer Sister        dont know what type    Past Surgical History:  Procedure Laterality Date  . COLONOSCOPY W/ POLYPECTOMY    . INGUINAL HERNIA REPAIR Right 1995  . INGUINAL HERNIA REPAIR Left 11/12/2013   Procedure: HERNIA REPAIR INGUINAL INCARCERATED;  Surgeon: Adolph Pollackodd J Rosenbower, MD;  [facility];  Service: General;  Laterality: Left;  . SHOULDER ARTHROSCOPY WITH SUBACROMIAL DECOMPRESSION, ROTATOR CUFF REPAIR AND BICEP TENDON REPAIR Right 07/14/2018   Procedure: RIGHT SHOULDER ARTHROSCOPY, DEBRIDEMENT, BICEPS TENODESIS, DISTAL CLAVICLE EXCISION AND MINI OPEN ROTATOR CUFF TEAR REPAIR;  Surgeon: Cammy Copaean, Gregory Scott, MD;  Location: [facility] OR;  Service: Orthopedics;  Laterality: Right;   Social History   Occupational History  . Occupation: Maintenance Tech  Tobacco Use  . Smoking status: Never Smoker  . Smokeless tobacco: Never Used  Substance and Sexual Activity  . Alcohol use: Yes    Alcohol/week: 1.0 - 2.0 standard drinks    Types: 1 - 2  Shots of liquor per week    Comment: occasionally.  . Drug use: No  . Sexual activity: Not on file

## 2018-11-11 ENCOUNTER — Ambulatory Visit (INDEPENDENT_AMBULATORY_CARE_PROVIDER_SITE_OTHER): Payer: 59 | Admitting: Internal Medicine

## 2018-11-11 ENCOUNTER — Other Ambulatory Visit: Payer: Self-pay

## 2018-11-11 VITALS — BP 136/85 | HR 87 | Temp 98.7°F | Ht >= 80 in | Wt 299.8 lb

## 2018-11-11 DIAGNOSIS — R5383 Other fatigue: Secondary | ICD-10-CM | POA: Insufficient documentation

## 2018-11-11 DIAGNOSIS — M545 Low back pain: Secondary | ICD-10-CM | POA: Diagnosis not present

## 2018-11-11 LAB — INFLUENZA PANEL BY PCR (TYPE A & B)
Influenza A By PCR: NEGATIVE
Influenza B By PCR: NEGATIVE

## 2018-11-11 NOTE — Assessment & Plan Note (Signed)
Patient with 1 day of fatigue and mild low back pain w/o other signs of viral illness.  Plan: --flu swab obtained - negative --conservative treatment of back pain with tylenol for now --per patient request, letter will be faxed to his employer (release of records signed by patient).

## 2018-11-11 NOTE — Progress Notes (Signed)
   CC: fatigue  HPI:  [name] is a 53 y.o. with a PMH listed below presenting to clinic at the request of his employer to get tested for influenza.  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 Patient took day off yesterday due to fatigue and his employer requested he get tested for influenza so that patient could get appropriate treatment and to minimize risk of transferring illness to other employees and patients. Patient endorses only new lower back pain that is not life limiting and the mild fatigue yesterday; he denies cough, sore throat, rhinorrhea, congestion, myalgias, nausea, vomiting, diarrhea, abd pain, fevers, chills. He has been taking OTC tylenol for back pain with some relief.   Please see problem based Assessment and Plan for status of patients chronic conditions.  Past Medical History:  Diagnosis Date  . Acute gout 12/31/2016  . Anxiety    "had taken Paxil; back in 1996; nothing since" (10/21/2013)  . Arthritis   . Asthma    asa child  . Dyslipidemia 03/11/2016   12.5% 10-year risk of heart disease or stroke, should be on a moderate/high intensity statin.    . Essential hypertension, benign 02/23/2016  . Refusal of blood transfusions as patient is Jehovah's Witness     Review of Systems:   Per HPI  Physical Exam:  Vitals:   11/11/18 1128  BP: 136/85  Pulse: 87  Temp: 98.7 F (37.1 C)  TempSrc: Oral  SpO2: 96%  Weight: 299 lb 12.8 oz (136 kg)  Height: 6\' 9"  (2.057 m)   GENERAL- alert, co-operative, appears as stated age, not in any distress. HEENT- oral mucosa appears moist, no oropharyngeal exudate, no lymphadenopathy. CARDIAC- RRR, no murmurs, rubs or gallops. RESP- Moving equal volumes of air, and clear to auscultation bilaterally, no wheezes or crackles. SKIN- Warm, dry.  Assessment & Plan:   See Encounters Tab for problem based charting.   Patient  discussed with Dr. Ellamae Sia, MD Internal Medicine PGY-3

## 2018-11-12 NOTE — Progress Notes (Signed)
Internal Medicine Clinic Attending  Case discussed with Dr. Svalina at the time of the visit.  We reviewed the resident's history and exam and pertinent patient test results.  I agree with the assessment, diagnosis, and plan of care documented in the resident's note.  Alexander Raines, M.D., Ph.D.  

## 2018-12-01 ENCOUNTER — Other Ambulatory Visit (INDEPENDENT_AMBULATORY_CARE_PROVIDER_SITE_OTHER): Payer: Self-pay | Admitting: Orthopedic Surgery

## 2018-12-01 MED ORDER — CYCLOBENZAPRINE HCL 5 MG PO TABS: ORAL_TABLET | ORAL | 0 refills | Status: DC

## 2018-12-01 MED ORDER — MELOXICAM 15 MG PO TABS: ORAL_TABLET | ORAL | 0 refills | Status: DC

## 2018-12-01 MED ORDER — OXYCODONE HCL 5 MG PO TABS: ORAL_TABLET | ORAL | 0 refills | Status: DC

## 2018-12-01 NOTE — Telephone Encounter (Signed)
Please advise 

## 2018-12-01 NOTE — Telephone Encounter (Signed)
Okay to refill but this will be his last refill.  He needs to transition over-the-counter medicine after this.

## 2018-12-01 NOTE — Telephone Encounter (Signed)
See prior messages

## 2018-12-01 NOTE — Telephone Encounter (Signed)
You have to send the oxycodone in electronically, they aren't taking paper Rx's anymore

## 2018-12-01 NOTE — Telephone Encounter (Signed)
Okay to refill but this will be his last refill.  He is about 3 months out from surgery.  He would need to transition to over-the-counter medication after this

## 2018-12-01 NOTE — Telephone Encounter (Signed)
See prior messages.  He needs to take either Flexeril or Robaxin.  I think of already refilled the Flexeril.

## 2018-12-01 NOTE — Telephone Encounter (Signed)
Cipro see prior messages

## 2018-12-01 NOTE — Telephone Encounter (Signed)
Rx sent in to pharm 

## 2018-12-04 ENCOUNTER — Other Ambulatory Visit: Payer: Self-pay | Admitting: Internal Medicine

## 2018-12-29 ENCOUNTER — Encounter (HOSPITAL_COMMUNITY): Payer: Self-pay | Admitting: Emergency Medicine

## 2018-12-29 ENCOUNTER — Emergency Department (HOSPITAL_COMMUNITY): Payer: 59

## 2018-12-29 ENCOUNTER — Other Ambulatory Visit: Payer: Self-pay

## 2018-12-29 ENCOUNTER — Inpatient Hospital Stay (HOSPITAL_COMMUNITY)
Admission: EM | Admit: 2018-12-29 | Discharge: 2019-01-01 | DRG: 558 | Disposition: A | Discharge status: Home / Self Care | Payer: 59 | Attending: Student in an Organized Health Care Education/Training Program | Admitting: Student in an Organized Health Care Education/Training Program

## 2018-12-29 DIAGNOSIS — Z79891 Long term (current) use of opiate analgesic: Secondary | ICD-10-CM

## 2018-12-29 DIAGNOSIS — R072 Precordial pain: Secondary | ICD-10-CM

## 2018-12-29 DIAGNOSIS — Z8249 Family history of ischemic heart disease and other diseases of the circulatory system: Secondary | ICD-10-CM

## 2018-12-29 DIAGNOSIS — M75101 Unspecified rotator cuff tear or rupture of right shoulder, not specified as traumatic: Principal | ICD-10-CM | POA: Diagnosis present

## 2018-12-29 DIAGNOSIS — Z79899 Other long term (current) drug therapy: Secondary | ICD-10-CM

## 2018-12-29 DIAGNOSIS — E669 Obesity, unspecified: Secondary | ICD-10-CM | POA: Diagnosis present

## 2018-12-29 DIAGNOSIS — E785 Hyperlipidemia, unspecified: Secondary | ICD-10-CM | POA: Diagnosis present

## 2018-12-29 DIAGNOSIS — Z6831 Body mass index (BMI) 31.0-31.9, adult: Secondary | ICD-10-CM

## 2018-12-29 DIAGNOSIS — Z791 Long term (current) use of non-steroidal anti-inflammatories (NSAID): Secondary | ICD-10-CM

## 2018-12-29 DIAGNOSIS — I119 Hypertensive heart disease without heart failure: Secondary | ICD-10-CM | POA: Diagnosis present

## 2018-12-29 DIAGNOSIS — R55 Syncope and collapse: Secondary | ICD-10-CM

## 2018-12-29 DIAGNOSIS — M67911 Unspecified disorder of synovium and tendon, right shoulder: Secondary | ICD-10-CM | POA: Diagnosis present

## 2018-12-29 DIAGNOSIS — I10 Essential (primary) hypertension: Secondary | ICD-10-CM

## 2018-12-29 DIAGNOSIS — J9811 Atelectasis: Secondary | ICD-10-CM | POA: Diagnosis present

## 2018-12-29 DIAGNOSIS — R001 Bradycardia, unspecified: Secondary | ICD-10-CM | POA: Diagnosis present

## 2018-12-29 DIAGNOSIS — R079 Chest pain, unspecified: Secondary | ICD-10-CM

## 2018-12-29 DIAGNOSIS — R0789 Other chest pain: Secondary | ICD-10-CM | POA: Diagnosis not present

## 2018-12-29 DIAGNOSIS — M66821 Spontaneous rupture of other tendons, right upper arm: Secondary | ICD-10-CM | POA: Diagnosis present

## 2018-12-29 LAB — CBC WITH DIFFERENTIAL/PLATELET
ABS IMMATURE GRANULOCYTES: 0 10*3/uL (ref 0.00–0.07)
Basophils Absolute: 0 10*3/uL (ref 0.0–0.1)
Basophils Relative: 0 %
Eosinophils Absolute: 0.2 10*3/uL (ref 0.0–0.5)
Eosinophils Relative: 3 %
HEMATOCRIT: 43.9 % (ref 39.0–52.0)
HEMOGLOBIN: 14.7 g/dL (ref 13.0–17.0)
IMMATURE GRANULOCYTES: 0 %
LYMPHS ABS: 1.7 10*3/uL (ref 0.7–4.0)
LYMPHS PCT: 37 %
MCH: 28.1 pg (ref 26.0–34.0)
MCHC: 33.5 g/dL (ref 30.0–36.0)
MCV: 83.9 fL (ref 80.0–100.0)
MONOS PCT: 8 %
Monocytes Absolute: 0.4 10*3/uL (ref 0.1–1.0)
NEUTROS PCT: 52 %
Neutro Abs: 2.4 10*3/uL (ref 1.7–7.7)
Platelets: 194 10*3/uL (ref 150–400)
RBC: 5.23 MIL/uL (ref 4.22–5.81)
RDW: 13.4 % (ref 11.5–15.5)
WBC: 4.6 10*3/uL (ref 4.0–10.5)
nRBC: 0 % (ref 0.0–0.2)

## 2018-12-29 LAB — CBC
HCT: 47.4 % (ref 39.0–52.0)
Hemoglobin: 15.6 g/dL (ref 13.0–17.0)
MCH: 28.1 pg (ref 26.0–34.0)
MCHC: 32.9 g/dL (ref 30.0–36.0)
MCV: 85.4 fL (ref 80.0–100.0)
Platelets: 197 10*3/uL (ref 150–400)
RBC: 5.55 MIL/uL (ref 4.22–5.81)
RDW: 13.6 % (ref 11.5–15.5)
WBC: 4.8 10*3/uL (ref 4.0–10.5)
nRBC: 0 % (ref 0.0–0.2)

## 2018-12-29 LAB — COMPREHENSIVE METABOLIC PANEL
ALK PHOS: 54 U/L (ref 38–126)
ALT: 33 U/L (ref 0–44)
AST: 25 U/L (ref 15–41)
Albumin: 3.8 g/dL (ref 3.5–5.0)
Anion gap: 9 (ref 5–15)
BUN: 14 mg/dL (ref 6–20)
CHLORIDE: 106 mmol/L (ref 98–111)
CO2: 23 mmol/L (ref 22–32)
CREATININE: 1.02 mg/dL (ref 0.61–1.24)
Calcium: 8.9 mg/dL (ref 8.9–10.3)
Glucose, Bld: 102 mg/dL — ABNORMAL HIGH (ref 70–99)
Potassium: 3.5 mmol/L (ref 3.5–5.1)
Sodium: 138 mmol/L (ref 135–145)
TOTAL PROTEIN: 7.1 g/dL (ref 6.5–8.1)
Total Bilirubin: 1 mg/dL (ref 0.3–1.2)

## 2018-12-29 LAB — CREATININE, SERUM
Creatinine, Ser: 1.08 mg/dL (ref 0.61–1.24)
GFR calc Af Amer: >60 mL/min (ref >60)

## 2018-12-29 LAB — I-STAT TROPONIN, ED: Troponin i, poc: 0.01 ng/mL (ref 0.00–0.08)

## 2018-12-29 LAB — TROPONIN I
Troponin I: <0.03 ng/mL (ref <0.03)
Troponin I: <0.03 ng/mL (ref <0.03)

## 2018-12-29 LAB — LIPASE, BLOOD: Lipase: 30 U/L (ref 11–51)

## 2018-12-29 MED ORDER — ACETAMINOPHEN 325 MG PO TABS
650.0000 mg | ORAL_TABLET | Freq: Four times a day (QID) | ORAL | Status: DC | PRN
  Administered 2018-12-29 – 2018-12-30 (×2): 650 mg via ORAL
  Filled 2018-12-29 (×2): qty 2

## 2018-12-29 MED ORDER — MORPHINE SULFATE (PF) 4 MG/ML IV SOLN
4.0000 mg | Freq: Once | INTRAVENOUS | Status: AC
  Administered 2018-12-29: 4 mg via INTRAVENOUS
  Filled 2018-12-29: qty 1

## 2018-12-29 MED ORDER — ONDANSETRON HCL 4 MG/2ML IJ SOLN: 4.0000 mg | Freq: Four times a day (QID) | INTRAMUSCULAR | Status: DC | PRN

## 2018-12-29 MED ORDER — ACETAMINOPHEN 650 MG RE SUPP: 650.0000 mg | Freq: Four times a day (QID) | RECTAL | Status: DC | PRN

## 2018-12-29 MED ORDER — POLYETHYLENE GLYCOL 3350 17 G PO PACK: 17.0000 g | PACK | Freq: Every day | ORAL | Status: DC | PRN

## 2018-12-29 MED ORDER — KETOROLAC TROMETHAMINE 30 MG/ML IJ SOLN
30.0000 mg | Freq: Once | INTRAMUSCULAR | Status: AC
  Administered 2018-12-29: 30 mg via INTRAVENOUS
  Filled 2018-12-29: qty 1

## 2018-12-29 MED ORDER — LIDOCAINE 5 % EX PTCH
1.0000 | MEDICATED_PATCH | CUTANEOUS | Status: DC
  Administered 2018-12-29 – 2018-12-30 (×2): 1 via TRANSDERMAL
  Filled 2018-12-29 (×3): qty 1

## 2018-12-29 MED ORDER — SODIUM CHLORIDE 0.9% FLUSH
3.0000 mL | Freq: Two times a day (BID) | INTRAVENOUS | Status: DC
  Administered 2018-12-29 – 2018-12-31 (×5): 3 mL via INTRAVENOUS

## 2018-12-29 MED ORDER — ENOXAPARIN SODIUM 40 MG/0.4ML ~~LOC~~ SOLN
40.0000 mg | SUBCUTANEOUS | Status: DC
  Administered 2018-12-29 – 2018-12-31 (×3): 40 mg via SUBCUTANEOUS
  Filled 2018-12-29 (×3): qty 0.4

## 2018-12-29 MED ORDER — LIDOCAINE VISCOUS HCL 2 % MT SOLN
15.0000 mL | Freq: Once | OROMUCOSAL | Status: AC
  Administered 2018-12-29: 15 mL via ORAL
  Filled 2018-12-29: qty 15

## 2018-12-29 MED ORDER — SODIUM CHLORIDE 0.9 % IV SOLN: 250.0000 mL | INTRAVENOUS | Status: DC | PRN

## 2018-12-29 MED ORDER — SODIUM CHLORIDE 0.9% FLUSH: 3.0000 mL | INTRAVENOUS | Status: DC | PRN

## 2018-12-29 MED ORDER — ONDANSETRON HCL 4 MG/2ML IJ SOLN
4.0000 mg | Freq: Once | INTRAMUSCULAR | Status: AC
  Administered 2018-12-29: 4 mg via INTRAVENOUS
  Filled 2018-12-29: qty 2

## 2018-12-29 MED ORDER — ATORVASTATIN CALCIUM 40 MG PO TABS
40.0000 mg | ORAL_TABLET | Freq: Every day | ORAL | Status: DC
  Administered 2018-12-30 – 2018-12-31 (×2): 40 mg via ORAL
  Filled 2018-12-29 (×2): qty 1

## 2018-12-29 MED ORDER — ASPIRIN EC 81 MG PO TBEC
81.0000 mg | DELAYED_RELEASE_TABLET | Freq: Every day | ORAL | Status: DC
  Administered 2018-12-29 – 2019-01-01 (×4): 81 mg via ORAL
  Filled 2018-12-29 (×4): qty 1

## 2018-12-29 MED ORDER — KETOROLAC TROMETHAMINE 30 MG/ML IJ SOLN
30.0000 mg | Freq: Four times a day (QID) | INTRAMUSCULAR | Status: DC | PRN
  Administered 2018-12-30 – 2018-12-31 (×2): 30 mg via INTRAVENOUS
  Filled 2018-12-29 (×2): qty 1

## 2018-12-29 MED ORDER — ALUM & MAG HYDROXIDE-SIMETH 200-200-20 MG/5ML PO SUSP
30.0000 mL | Freq: Once | ORAL | Status: AC
  Administered 2018-12-29: 30 mL via ORAL
  Filled 2018-12-29: qty 30

## 2018-12-29 MED ORDER — ONDANSETRON HCL 4 MG PO TABS: 4.0000 mg | ORAL_TABLET | Freq: Four times a day (QID) | ORAL | Status: DC | PRN

## 2018-12-29 NOTE — ED Notes (Addendum)
ED TO INPATIENT HANDOFF REPORT  ED Nurse Name and Phone #:  Dorathy Daft [phone]  S Name/Age/Gender Darrell King 53 y.o. male Room/Bed: H013C/H013C  Code Status   Code Status: Prior  Full Code  Home/SNF/Other Home Patient oriented to: self, place, time and situation Is this baseline? Yes   Triage Complete: Triage complete  Chief Complaint cp  Triage Note Arrived via EMS onset today chest pain heaviness and syncope while at work with general weakness. EMS administered aspirin 324 mg and 1 nitro tab with little to no relief.     Allergies No Known Allergies  Level of Care/Admitting Diagnosis ED Disposition    ED Disposition Condition Comment   Admit  The patient appears reasonably stabilized for admission considering the current resources, flow, and capabilities available in the ED at this time, and I doubt any other [facility] requiring further screening and/or treatment in the ED prior to admission is  present.       B Medical/Surgery History Past Medical History:  Diagnosis Date  . Acute gout 12/31/2016  . Anxiety    "had taken Paxil; back in 1996; nothing since" (10/21/2013)  . Arthritis   . Asthma    asa child  . Dyslipidemia 03/11/2016   12.5% 10-year risk of heart disease or stroke, should be on a moderate/high intensity statin.    . Essential hypertension, benign 02/23/2016  . Refusal of blood transfusions as patient is Jehovah's Witness    Past Surgical History:  Procedure Laterality Date  . COLONOSCOPY W/ POLYPECTOMY    . INGUINAL HERNIA REPAIR Right 1995  . INGUINAL HERNIA REPAIR Left 11/12/2013   Procedure: HERNIA REPAIR INGUINAL INCARCERATED;  Surgeon: Adolph Pollack, MD;  Location: [facility] OR;  Service: General;  Laterality: Left;  . SHOULDER ARTHROSCOPY WITH SUBACROMIAL DECOMPRESSION, ROTATOR CUFF REPAIR AND BICEP TENDON REPAIR Right 07/14/2018   Procedure: RIGHT SHOULDER ARTHROSCOPY, DEBRIDEMENT, BICEPS TENODESIS, DISTAL CLAVICLE EXCISION AND MINI OPEN ROTATOR  CUFF TEAR REPAIR;  Surgeon: Cammy Copa, MD;  Location: [facility] OR;  Service: Orthopedics;  Laterality: Right;     A IV Location/Drains/Wounds Patient Lines/Drains/Airways Status   Active Line/Drains/Airways    Name:   Placement date:   Placement time:   Site:   Days:   Incision 11/12/13 Groin Left   11/12/13    1607     1873   Incision (Closed) 07/14/18 Shoulder Right   07/14/18    1059     168          Intake/Output Last 24 hours No intake or output data in the 24 hours ending 12/29/18 1316  Labs/Imaging Results for orders placed or performed during the hospital encounter of 12/29/18 (from the past 48 hour(s))  Comprehensive metabolic panel     Status: Abnormal   Collection Time: 12/29/18 10:14 AM  Result Value Ref Range   Sodium 138 135 - 145 mmol/L   Potassium 3.5 3.5 - 5.1 mmol/L   Chloride 106 98 - 111 mmol/L   CO2 23 22 - 32 mmol/L   Glucose, Bld 102 (H) 70 - 99 mg/dL   BUN 14 6 - 20 mg/dL   Creatinine, Ser 6.80 0.61 - 1.24 mg/dL   Calcium 8.9 8.9 - 88.1 mg/dL   Total Protein 7.1 6.5 - 8.1 g/dL   Albumin 3.8 3.5 - 5.0 g/dL   AST 25 15 - 41 U/L   ALT 33 0 - 44 U/L   Alkaline Phosphatase 54 38 - 126 U/L  Total Bilirubin 1.0 0.3 - 1.2 mg/dL   GFR calc non Af Amer >60 >60 mL/min   GFR calc Af Amer >60 >60 mL/min   Anion gap 9 5 - 15    Comment: Performed at [facility], [address], [place], [place] [address]  Lipase, blood     Status: None   Collection Time: 12/29/18 10:14 AM  Result Value Ref Range   Lipase 30 11 - 51 U/L    Comment: Performed at [facility], [address] [place], [place] [address]  CBC with Differential     Status: None   Collection Time: 12/29/18 10:14 AM  Result Value Ref Range   WBC 4.6 4.0 - 10.5 K/uL   RBC 5.23 4.22 - 5.81 MIL/uL   Hemoglobin 14.7 13.0 - 17.0 g/dL   HCT 83.2 91.9 - 16.6 %   MCV 83.9 80.0 - 100.0 fL   MCH 28.1 26.0 - 34.0 pg   MCHC 33.5 30.0 - 36.0 g/dL   RDW 06.0 04.5 - 99.7 %   Platelets 194  150 - 400 K/uL   nRBC 0.0 0.0 - 0.2 %   Neutrophils Relative % 52 %   Neutro Abs 2.4 1.7 - 7.7 K/uL   Lymphocytes Relative 37 %   Lymphs Abs 1.7 0.7 - 4.0 K/uL   Monocytes Relative 8 %   Monocytes Absolute 0.4 0.1 - 1.0 K/uL   Eosinophils Relative 3 %   Eosinophils Absolute 0.2 0.0 - 0.5 K/uL   Basophils Relative 0 %   Basophils Absolute 0.0 0.0 - 0.1 K/uL   Immature Granulocytes 0 %   Abs Immature Granulocytes 0.00 0.00 - 0.07 K/uL    Comment: Performed at [facility], [address] [place], [place] [address]  I-Stat Troponin, ED (not at [facility])     Status: None   Collection Time: 12/29/18 10:23 AM  Result Value Ref Range   Troponin i, poc 0.01 0.00 - 0.08 ng/mL   Comment 3            Comment: Due to the release kinetics of cTnI, a negative result within the first hours of the onset of symptoms does not rule out myocardial infarction with certainty. If myocardial infarction is still suspected, repeat the test at appropriate intervals.    Ct Head Wo Contrast  Result Date: 12/29/2018 CLINICAL DATA:  Syncope EXAM: CT HEAD WITHOUT CONTRAST TECHNIQUE: Contiguous axial images were obtained from the base of the skull through the vertex without intravenous contrast. COMPARISON:  10/21/2013 FINDINGS: Brain: No evidence of acute infarction, hemorrhage, hydrocephalus, extra-axial collection or mass lesion/mass effect. Vascular: No hyperdense vessel or unexpected calcification. Skull: Normal. Negative for fracture or focal lesion. Sinuses/Orbits: No acute finding. Other: None. IMPRESSION: No acute intracranial pathology. Electronically Signed   By: Lauralyn Primes M.D.   On: 12/29/2018 11:47   Dg Chest Port 1 View  Result Date: 12/29/2018 CLINICAL DATA:  Chest pain EXAM: PORTABLE CHEST 1 VIEW COMPARISON:  11/16/2014 FINDINGS: Low lung volumes, bibasilar atelectasis. Mild vascular congestion/crowding. Heart is borderline in size. No acute bony abnormality. IMPRESSION: Low lung volumes with  mild vascular congestion and bibasilar atelectasis. Electronically Signed   By: Charlett Nose M.D.   On: 12/29/2018 10:28    Pending Labs Unresulted Labs (From admission, onward)   None      Vitals/Pain Today's Vitals   12/29/18 1010 12/29/18 1030 12/29/18 1040 12/29/18 1113  BP:  124/89  Pulse:  63    Resp:  15    Temp:      TempSrc:      SpO2:  94%    Weight:      Height:      PainSc: Isolation Precautions No active isolations  Medications Medications  ondansetron (ZOFRAN) injection 4 mg (4 mg Intravenous Given 12/29/18 1011)  morphine 4 MG/ML injection 4 mg (4 mg Intravenous Given 12/29/18 1011)  morphine 4 MG/ML injection 4 mg (4 mg Intravenous Given 12/29/18 1043)    Mobility walks Low fall risk   Focused Assessments Cardiac Assessment Handoff:  Cardiac Rhythm: Normal sinus rhythm Lab Results  Component Value Date   TROPONINI <0.30 10/22/2013   No results found for: DDIMER Does the Patient currently have chest pain? Yes     R Recommendations: See Admitting Provider Note  Report given to: [facility]  Additional Notes:

## 2018-12-29 NOTE — ED Notes (Signed)
MD Messick at bedside 

## 2018-12-29 NOTE — ED Notes (Signed)
Pt reports worsening CP, MD Messick aware, will get another EKG per his instructions.

## 2018-12-29 NOTE — ED Notes (Signed)
Patient transported to CT 

## 2018-12-29 NOTE — ED Triage Notes (Signed)
Arrived via EMS onset today chest pain heaviness and syncope while at work with general weakness. EMS administered aspirin 324 mg and 1 nitro tab with little to no relief.

## 2018-12-29 NOTE — H&P (Addendum)
Date: 12/29/2018               Patient Name:  Darrell King MRN: [id number]  DOB: December 15, 1965 Age / Sex: 53 y.o., male   PCP: Burna Cash, MD         Medical Service: Internal Medicine Teaching Service         Attending Physician: Dr. Oswaldo Done, Marquita Palms, *    First Contact: Dr. Maryla Morrow Pager: [phone]  Second Contact: Dr. Delma Post  Pager: [phone]       After Hours (After 5p/  First Contact Pager: [phone]  weekends / holidays): Second Contact Pager: [phone]   Chief Complaint: Chest pain and syncope  History of Present Illness: 53 y/o male with PMHx significant for HTN, HLD, who presented with chest pain and syncope an hour before arrival. Mr. 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  He noticed some pain on his right shoulder (with hx of rotator cuff surgery) He was talking over the phone when he felt this pressure-like chest pain, with intensity of 10 out of 10 on his entire chest. There was no pain radiation but he noticed some weakness on his right arm.  Pain was associated with nausea. Resting or activity did not change the pain.  He asked the nurse at rehab center to check his blood pressure and it was 170/103.  He felt he is throwing up and went to the bathroom but passed out after few minutes.  The nurse and coworker were around him and did not let him fall. He does not remember what happened, but endorses that he was in pain when he gained his consciousness.  EMS was called and he was brought to [facility] emergency department.  He mentions that he was given nitroglycerin. It gave him headache but did not help with the chest pain.   The pain was consistent for past 5-6 hours (He says the Morphine that he got in hopsital  helped with the pain initially but not any more. (He reports 6 out of 10 pressure-like chest pain currently.). His nausea resolved.  Patient denies any previous  history of chest pain.  His wife endorses that he had multiple episodes of syncops around 1998. Last episode was about 2 years ago and per wife, patient got Holter monitoring that time but they were told that it did not show any thing and the etiology remained unclear.    Meds:  Current Meds  Medication Sig  . naproxen sodium (ALEVE) 220 MG tablet Take 660 mg by mouth daily as needed (pain).   Patient was prescribed Losartan 50 mg QD and Pravastatin 40 QD before, but he has not taken them for about a month.  Allergies: Allergies as of 12/29/2018  . (No Known Allergies)   Past Medical History:  Diagnosis Date  . Acute gout 12/31/2016  . Anxiety    "had taken Paxil; back in 1996; nothing since" (10/21/2013)  . Arthritis   . Asthma    asa child  . Dyslipidemia 03/11/2016   12.5% 10-year risk of heart disease or stroke, should be on a moderate/high intensity statin.    . Essential hypertension, benign 02/23/2016  . Refusal of blood transfusions as patient is Jehovah's Witness     Family History:   Mother has colon cancer Sister has cancer No FamHx of premature cardiac death   Social History:  Patient lives in [place], with his wife and their 3 children. 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 Mr. Guldner drinks alcohol occasionally. He never smoked and does not have any history of drug use.   Review of Systems: A complete ROS was negative except as per HPI.   Physical Exam: Blood pressure 134/81, pulse 61, temperature (!) 97.4 F (36.3 C), temperature source Oral, resp. rate 16, height 6\' 9"  (2.057 m), weight (!) 140.6 kg, SpO2 99 %.  Physical Exam Vitals signs reviewed.  Constitutional:      General: He is not in acute distress.    Appearance: He is well-developed.  HENT:     Head: Normocephalic and atraumatic.  Eyes:     Extraocular Movements: Extraocular movements intact.     Pupils: Pupils are equal, round, and reactive to light.  Cardiovascular:      Rate and Rhythm: Normal rate and regular rhythm.     Heart sounds: Normal heart sounds. No murmur.  Pulmonary:     Effort: Pulmonary effort is normal.     Breath sounds: Examination of the right-lower field reveals rhonchi. Rhonchi present. No wheezing or rales.  Abdominal:     General: Bowel sounds are normal.     Palpations: Abdomen is soft.     Tenderness: There is no abdominal tenderness.  Musculoskeletal:     Right lower leg: No edema.     Left lower leg: No edema.     Comments: Patient has significant tenderness at right anterior chest wall.  Skin:    General: Skin is warm and dry.  Neurological:     Mental Status: He is alert and oriented to person, place, and time.     Comments: Right upper extremity has 4+/5 strength. Motor at other extremities are 5/5. Sensation is intact and normal. CN I-XII are intact.   Psychiatric:        Mood and Affect: Mood normal.    Labs: I stat Trop: 0.01 CMP: Normal Lipase: Nl CBC: Nl  EKG: personally reviewed my interpretation is NSR, poor R progression. nonsignificant ST T change at V4.  CXR: personally reviewed my interpretation is  CT head : No acute intracranial pathology. Assessment & Plan by Problem: Active Problems:   Chest pain  53 year old male with past medical history of hypertension, hyperlipidemia (off of his medications for about a year), presented with pressure-like chest pain, nausea and syncope.   Chest pain:   He presented with severe chest pain, nausea, syncope.   Has both typical and (more) atypical features. (Pain is not related with activity, no improvement with rest, not improved with nitroglycerin, has duration of several hours, chest pain is reproducible on exam, and he has significant tenderness on his right chest wall.) i-STAT troponin was 0.01.  EKG with no significant ST-T changes.  Patient with low risk at heart score. Also low risk per [facility] Chest Pain Rule.  Seems to be musculoskeletal pain.  But we will trend Trop and monitor serial EKG to rule out ACS.  -Trend Trop q 6h -Serial EKG -BMP tomorrow AM -Cardiac monitoring -Miralax PRN -ASA 81 mg QD -Lipitor 40 mg QD -Ketorolac 30 mg IV q 6h PRN -I &O -Weight daily  Left arm weakness: Mild weakness (motor 4+/5, at Right hand), Cranial nerves are normal and motor and sensory are otherwise intact. Initial head CT (that performed as syncope work up) did not show intracranial abnormality. Can be due to previous shoulder and nerve injury  and surgery, considering the new chest wall and shoulder pain today.  Will monitor patient with frequent neurologic evaluation. May consider Brain MRI for stroke if no improvement or worsens.  -Frequent neurologic evaluation q4 h and notify MD if abnormal  Diet: HH IV fluid: VTE ppx: Lovenox Code status: Full   Dispo: Admit patient to Observation with expected length of stay less than 2 midnights.  SignedChevis Pretty, MD 12/29/2018, 4:28 PM  Pager: [phone]

## 2018-12-29 NOTE — ED Provider Notes (Signed)
[facility] EMERGENCY DEPARTMENT Provider Note   CSN: [id number] Arrival date & time: 12/29/18  3817    History   Chief Complaint Chief Complaint  Patient presents with  . Chest Pain  . Loss of Consciousness    HPI Darrell King is a 53 y.o. male.     53 year old male with prior medical history as detailed below presents for evaluation of chest discomfort.  Patient reports that he had acute onset of chest discomfort approximately 1 hour prior to arrival.  He describes diffuse chest pressure.  This was associated with nausea.  He apparently went to the bathroom with coworkers and then had a brief near syncope or syncopal episode associated with vomiting.  He may have passed out for approximately 2 to 3 minutes.  He did not injure himself since his coworkers caught him.  Upon arrival to the ED does feel somewhat improved although he complains of persistent vague chest pressure.  The history is provided by the patient and medical records.  Chest Pain  Pain location:  Substernal area Pain quality: aching and tightness   Pain radiates to:  Does not radiate Pain severity:  Mild Onset quality:  Gradual Duration:  1 hour Timing:  Rare Progression:  Partially resolved Chronicity:  New Relieved by:  Nothing Worsened by:  Nothing   Past Medical History:  Diagnosis Date  . Acute gout 12/31/2016  . Anxiety    "had taken Paxil; back in 1996; nothing since" (10/21/2013)  . Arthritis   . Asthma    asa child  . Dyslipidemia 03/11/2016   12.5% 10-year risk of heart disease or stroke, should be on a moderate/high intensity statin.    . Essential hypertension, benign 02/23/2016  . Refusal of blood transfusions as patient is Jehovah's Witness     Patient Active Problem List   Diagnosis Date Noted  . Other fatigue 11/11/2018  . Gout 12/31/2016  . Dyslipidemia 03/11/2016  . Essential hypertension, benign 02/23/2016  . Tendinopathy of right rotator cuff 02/02/2016    . Numbness and tingling of right upper extremity 11/29/2014  . Patient is Jehovah's Witness 01/04/2014  . Health care maintenance 11/24/2013  . Left inguinal hernia 11/11/2013    Past Surgical History:  Procedure Laterality Date  . COLONOSCOPY W/ POLYPECTOMY    . INGUINAL HERNIA REPAIR Right 1995  . INGUINAL HERNIA REPAIR Left 11/12/2013   Procedure: HERNIA REPAIR INGUINAL INCARCERATED;  Surgeon: Adolph Pollack, MD;  Location: [facility] OR;  Service: General;  Laterality: Left;  . SHOULDER ARTHROSCOPY WITH SUBACROMIAL DECOMPRESSION, ROTATOR CUFF REPAIR AND BICEP TENDON REPAIR Right 07/14/2018   Procedure: RIGHT SHOULDER ARTHROSCOPY, DEBRIDEMENT, BICEPS TENODESIS, DISTAL CLAVICLE EXCISION AND MINI OPEN ROTATOR CUFF TEAR REPAIR;  Surgeon: Cammy Copa, MD;  Location: [facility] OR;  Service: Orthopedics;  Laterality: Right;        Home Medications    Prior to Admission medications   Medication Sig Start Date End Date Taking? Authorizing Provider  Colchicine 0.6 MG CAPS Take 1.2mg  once, then 1 hr later take 0.6mg . Then take 1 tab daily Patient taking differently: Take 0.6 mg by mouth daily.  12/31/16   Denton Brick, MD  cyclobenzaprine (FLEXERIL) 5 MG tablet 1 po q 12hrs prn 07/15/18   Cammy Copa, MD  cyclobenzaprine (FLEXERIL) 5 MG tablet 1 po q 12hrs prn 12/01/18   Cammy Copa, MD  cyclobenzaprine (FLEXERIL) 5 MG tablet 1 po q 12hrs prn 12/01/18   Rise Paganini  Lorin Picket, MD  losartan (COZAAR) 50 MG tablet Take 1 tablet (50 mg total) by mouth daily. 12/31/16   Denton Brick, MD  meloxicam [facility]) 15 MG tablet 1 po q d x 30 days 12/01/18   Cammy Copa, MD  methocarbamol (ROBAXIN) 500 MG tablet Take 1 tablet (500 mg total) by mouth every 8 (eight) hours as needed for muscle spasms. 07/14/18   Cammy Copa, MD  oxyCODONE (OXY IR/ROXICODONE) 5 MG immediate release tablet Take 1 tablet (5 mg total) by mouth every 4 (four) hours as needed for severe pain. 07/14/18   Cammy Copa, MD  oxyCODONE (OXY IR/ROXICODONE) 5 MG immediate release tablet 1 po q 6 hrs prn pain 12/01/18   Cammy Copa, MD  oxyCODONE (OXY IR/ROXICODONE) 5 MG immediate release tablet 1 po q 6 hrs prn pain 12/01/18   Cammy Copa, MD  pravastatin (PRAVACHOL) 40 MG tablet Take 1 tablet (40 mg total) by mouth daily. 06/05/16   Servando Snare, MD    Family History Family History  Problem Relation Age of Onset  . Colon cancer Mother        colon  . Cancer Sister        dont know what type    Social History Social History   Tobacco Use  . Smoking status: Never Smoker  . Smokeless tobacco: Never Used  Substance Use Topics  . Alcohol use: Yes    Alcohol/week: 1.0 - 2.0 standard drinks    Types: 1 - 2 Shots of liquor per week    Comment: occasionally.  . Drug use: No     Allergies   Patient has no known allergies.   Review of Systems Review of Systems  Cardiovascular: Positive for chest pain.  All other systems reviewed and are negative.    Physical Exam Updated Vital Signs BP (!) 140/95   Pulse 72   Temp (!) 97.4 F (36.3 C) (Oral)   Resp 16   Ht 6\' 9"  (2.057 m)   Wt (!) 140.6 kg   SpO2 97%   BMI 33.22 kg/m   Physical Exam Vitals signs and nursing note reviewed.  Constitutional:      General: He is not in acute distress.    Appearance: He is well-developed.  HENT:     Head: Normocephalic and atraumatic.  Eyes:     Conjunctiva/sclera: Conjunctivae normal.     Pupils: Pupils are equal, round, and reactive to light.  Neck:     Musculoskeletal: Normal range of motion and neck supple.  Cardiovascular:     Rate and Rhythm: Normal rate and regular rhythm.     Heart sounds: Normal heart sounds.  Pulmonary:     Effort: Pulmonary effort is normal. No respiratory distress.     Breath sounds: Normal breath sounds.  Abdominal:     General: There is no distension.     Palpations: Abdomen is soft.     Tenderness: There is no abdominal tenderness.    Musculoskeletal: Normal range of motion.        General: No deformity.  Skin:    General: Skin is warm and dry.  Neurological:     Mental Status: He is alert and oriented to person, place, and time.      ED Treatments / Results  Labs (all labs ordered are listed, but only abnormal results are displayed) Labs Reviewed  COMPREHENSIVE METABOLIC PANEL - Abnormal; Notable for the following components:  Result Value   Glucose, Bld 102 (*)    All other components within normal limits  LIPASE, BLOOD  CBC WITH DIFFERENTIAL/PLATELET  I-STAT TROPONIN, ED    EKG EKG Interpretation  Date/Time:  Monday December 29 2018 11:51:15 EST Ventricular Rate:  62 PR Interval:    QRS Duration: 94 QT Interval:  409 QTC Calculation: 416 R Axis:   22 Text Interpretation:  Sinus rhythm RSR' in V1 or V2, probably normal variant ST elev, probable normal early repol pattern Confirmed by Kristine Royal [phone]) on 12/29/2018 1:38:36 PM   Radiology Ct Head Wo Contrast  Result Date: 12/29/2018 CLINICAL DATA:  Syncope EXAM: CT HEAD WITHOUT CONTRAST TECHNIQUE: Contiguous axial images were obtained from the base of the skull through the vertex without intravenous contrast. COMPARISON:  10/21/2013 FINDINGS: Brain: No evidence of acute infarction, hemorrhage, hydrocephalus, extra-axial collection or mass lesion/mass effect. Vascular: No hyperdense vessel or unexpected calcification. Skull: Normal. Negative for fracture or focal lesion. Sinuses/Orbits: No acute finding. Other: None. IMPRESSION: No acute intracranial pathology. Electronically Signed   By: Lauralyn Primes M.D.   On: 12/29/2018 11:47   Dg Chest Port 1 View  Result Date: 12/29/2018 CLINICAL DATA:  Chest pain EXAM: PORTABLE CHEST 1 VIEW COMPARISON:  11/16/2014 FINDINGS: Low lung volumes, bibasilar atelectasis. Mild vascular congestion/crowding. Heart is borderline in size. No acute bony abnormality. IMPRESSION: Low lung volumes with mild vascular congestion  and bibasilar atelectasis. Electronically Signed   By: Charlett Nose M.D.   On: 12/29/2018 10:28    Procedures Procedures (including critical care time)  Medications Ordered in ED Medications  ondansetron (ZOFRAN) injection 4 mg (has no administration in time range)  morphine 4 MG/ML injection 4 mg (has no administration in time range)     Initial Impression / Assessment and Plan / ED Course  I have reviewed the triage vital signs and the nursing notes.  Pertinent labs & imaging results that were available during my care of the patient were reviewed by me and considered in my medical decision making (see chart for details).        MDM  Screen complete  Patient is presenting for evaluation of reported chest pain.  Initial EKG is without evidence of acute ischemia.  Initial troponin is negative.  Patient does feel improved following his initial ED treatment.  Other screening labs obtained do not reveal significant acute pathology.  Internal medicine teaching team is aware of case and will evaluate for admission.  Final Clinical Impressions(s) / ED Diagnoses   Final diagnoses:  Chest pain, unspecified type    ED Discharge Orders    None       Wynetta Fines, MD 12/29/18 1340

## 2018-12-30 ENCOUNTER — Other Ambulatory Visit: Payer: Self-pay

## 2018-12-30 ENCOUNTER — Observation Stay (HOSPITAL_COMMUNITY): Payer: 59

## 2018-12-30 DIAGNOSIS — J9811 Atelectasis: Secondary | ICD-10-CM | POA: Diagnosis present

## 2018-12-30 DIAGNOSIS — I10 Essential (primary) hypertension: Secondary | ICD-10-CM

## 2018-12-30 DIAGNOSIS — R079 Chest pain, unspecified: Secondary | ICD-10-CM

## 2018-12-30 DIAGNOSIS — E669 Obesity, unspecified: Secondary | ICD-10-CM | POA: Diagnosis present

## 2018-12-30 DIAGNOSIS — Z79891 Long term (current) use of opiate analgesic: Secondary | ICD-10-CM | POA: Diagnosis not present

## 2018-12-30 DIAGNOSIS — S46111A Strain of muscle, fascia and tendon of long head of biceps, right arm, initial encounter: Secondary | ICD-10-CM

## 2018-12-30 DIAGNOSIS — E785 Hyperlipidemia, unspecified: Secondary | ICD-10-CM | POA: Diagnosis present

## 2018-12-30 DIAGNOSIS — M75101 Unspecified rotator cuff tear or rupture of right shoulder, not specified as traumatic: Principal | ICD-10-CM

## 2018-12-30 DIAGNOSIS — Z8249 Family history of ischemic heart disease and other diseases of the circulatory system: Secondary | ICD-10-CM | POA: Diagnosis not present

## 2018-12-30 DIAGNOSIS — I119 Hypertensive heart disease without heart failure: Secondary | ICD-10-CM | POA: Diagnosis present

## 2018-12-30 DIAGNOSIS — X58XXXA Exposure to other specified factors, initial encounter: Secondary | ICD-10-CM

## 2018-12-30 DIAGNOSIS — R55 Syncope and collapse: Secondary | ICD-10-CM

## 2018-12-30 DIAGNOSIS — M66821 Spontaneous rupture of other tendons, right upper arm: Secondary | ICD-10-CM | POA: Diagnosis present

## 2018-12-30 DIAGNOSIS — R072 Precordial pain: Secondary | ICD-10-CM | POA: Diagnosis not present

## 2018-12-30 DIAGNOSIS — Z791 Long term (current) use of non-steroidal anti-inflammatories (NSAID): Secondary | ICD-10-CM | POA: Diagnosis not present

## 2018-12-30 DIAGNOSIS — Z6831 Body mass index (BMI) 31.0-31.9, adult: Secondary | ICD-10-CM | POA: Diagnosis not present

## 2018-12-30 DIAGNOSIS — R0789 Other chest pain: Secondary | ICD-10-CM | POA: Diagnosis present

## 2018-12-30 DIAGNOSIS — Z79899 Other long term (current) drug therapy: Secondary | ICD-10-CM | POA: Diagnosis not present

## 2018-12-30 DIAGNOSIS — R001 Bradycardia, unspecified: Secondary | ICD-10-CM | POA: Diagnosis present

## 2018-12-30 LAB — ECHOCARDIOGRAM COMPLETE
Height: 81 in
Weight: 4960 oz

## 2018-12-30 LAB — TROPONIN I
Troponin I: <0.03 ng/mL (ref <0.03)
Troponin I: <0.03 ng/mL (ref <0.03)

## 2018-12-30 LAB — GLUCOSE, CAPILLARY: GLUCOSE-CAPILLARY: 78 mg/dL (ref 70–99)

## 2018-12-30 LAB — HIV ANTIBODY (ROUTINE TESTING W REFLEX): HIV SCREEN 4TH GENERATION: NONREACTIVE

## 2018-12-30 MED ORDER — SODIUM CHLORIDE 0.9 % IV BOLUS
1000.0000 mL | Freq: Once | INTRAVENOUS | Status: AC
  Administered 2018-12-30: 1000 mL via INTRAVENOUS

## 2018-12-30 NOTE — Progress Notes (Signed)
  Echocardiogram 2D Echocardiogram has been performed.  Celene Skeen 12/30/2018, 2:42 PM

## 2018-12-30 NOTE — Progress Notes (Signed)
Physical Therapy Evaluation Patient Details Name: BRANDIN GLASSFORD MRN: [id number] DOB: [birth year] Today's Date: 12/30/2018   History of Present Illness  Patient is 53 y/o male presenting to hospital with chest pain and sycope episode. Patient also with L sided arm weakness. Per notes chest pain seems to be musculoskeletal in nature. PMH includes HTN, HLD, and R rotator cuff surgery.   Clinical Impression  Patient admitted to hospital secondary to problems above and with deficits below. Patient required min guard to stand x2. Patient with increased dizziness in standing and BP 123/110 however patient not orthostatic (BP seated 140/96). Upon standing second time, patient with increased dizziness and returned to sitting. Patient began to develop headache and less talkative but would still slowly respond to questions. BP 134/89. Require minA to return to supine and patient became more alert and asked "what happened." Patient refusing to perform RUE and BLE ROM at end of session secondary to pain. Further mobility deferred. RN notified and aware of patient symptoms. Will update follow up recommendations pending patient progress. Patient will benefit from acute physical therapy to maximize independence and safety with functional mobility.    Follow Up Recommendations Other (comment)(TBD pending clinical improvement)    Equipment Recommendations  Other (comment)(TBD)    Recommendations for Other Services       Precautions / Restrictions Precautions Precautions: None Restrictions Weight Bearing Restrictions: No      Mobility  Bed Mobility Overal bed mobility: Needs Assistance Bed Mobility: Supine to Sit;Sit to Supine     Supine to sit: Modified independent (Device/Increase time) Sit to supine: Min assist   General bed mobility comments: Patient at modI level to sit EOB. Required minA for LE management to return to bed following increased dizziness and headache.   Transfers Overall  transfer level: Needs assistance Equipment used: None Transfers: Sit to/from Stand Sit to Stand: Min guard         General transfer comment: Patient required min guard to stand for safety x2. Patient reports increased dizziness in standing. BP 123/110 and patient not orthostatic (BP seated 140/96). Returned to sitting with improvement of dizziness. Stood second time but returned to sitting secondary to increased dizziness in standing. Patient began to experience headache and became less talkative however would slowly respond to questions. BP 134/89. Returned to supine and further mobility deferred. When returned to supine, patient became more alert and asked "what happened."Patient with limited ROM in RUE and BLE secondary to pain when back in supine. RN notified and aware of patient symtpoms.     Ambulation/Gait                Stairs            Wheelchair Mobility    Modified Rankin (Stroke Patients Only)       Balance Overall balance assessment: Needs assistance Sitting-balance support: No upper extremity supported;Feet supported Sitting balance-Leahy Scale: Good     Standing balance support: No upper extremity supported Standing balance-Leahy Scale: Good                               Pertinent Vitals/Pain Pain Assessment: Faces Faces Pain Scale: Hurts a little bit Pain Location: generalized Pain Descriptors / Indicators: Aching Pain Intervention(s): Limited activity within patient's tolerance;Monitored during session    Home Living Family/patient expects to be discharged to:: Private residence Living Arrangements: Spouse/significant other Available Help at Discharge: Family;Available 24 hours/day Type of  Home: House Home Access: Stairs to enter Entrance Stairs-Rails: None Entrance Stairs-Number of Steps: 3 Home Layout: Two level Home Equipment: None      Prior Function Level of Independence: Independent               Hand  Dominance        Extremity/Trunk Assessment   Upper Extremity Assessment Upper Extremity Assessment: RUE deficits/detail;LUE deficits/detail RUE Deficits / Details: Patient with hx of RUE pain at baseline secondary to rotator cuff surgery. Limited ROM secondary to pain at beginning of session. Patient reported increased pain and refused to perform ROM at end of session LUE Deficits / Details: MMT grossly 5/5    Lower Extremity Assessment Lower Extremity Assessment: RLE deficits/detail;LLE deficits/detail RLE Deficits / Details: Patient able to perform functional mobility tasks at beginnnig of session. However, at end of session refusing to perfrom ROM secondary to pain.  LLE Deficits / Details: Patient able to perform functional mobility tasks at beginnnig of session. However, at end of session refusing to perfrom ROM secondary to pain.     Cervical / Trunk Assessment Cervical / Trunk Assessment: Normal  Communication   Communication: No difficulties  Cognition Arousal/Alertness: Awake/alert Behavior During Therapy: WFL for tasks assessed/performed Overall Cognitive Status: Within Functional Limits for tasks assessed                                        General Comments General comments (skin integrity, edema, etc.): Patient reports experiencing dizziness for last 2 months. Reports he just "works through it."    Exercises     Assessment/Plan    PT Assessment Patient needs continued PT services  PT Problem List Decreased strength;Decreased range of motion;Decreased activity tolerance;Decreased balance;Decreased mobility;Pain       PT Treatment Interventions DME instruction;Gait training;Stair training;Functional mobility training;Therapeutic activities;Therapeutic exercise;Balance training;Patient/family education    PT Goals (Current goals can be found in the Care Plan section)  Acute Rehab PT Goals Patient Stated Goal: "get out of hospital" PT Goal  Formulation: With patient Time For Goal Achievement: 01/13/19 Potential to Achieve Goals: Good    Frequency Min 3X/week   Barriers to discharge        Co-evaluation               AM-PAC PT "6 Clicks" Mobility  Outcome Measure Help needed turning from your back to your side while in a flat bed without using bedrails?: None Help needed moving from lying on your back to sitting on the side of a flat bed without using bedrails?: None Help needed moving to and from a bed to a chair (including a wheelchair)?: A Little Help needed standing up from a chair using your arms (e.g., wheelchair or bedside chair)?: A Little Help needed to walk in hospital room?: A Lot Help needed climbing 3-5 steps with a railing? : Total 6 Click Score: 17    End of Session Equipment Utilized During Treatment: Gait belt Activity Tolerance: Treatment limited secondary to medical complications (Comment)(dizziness) Patient left: in bed;with call bell/phone within reach;with family/visitor present Nurse Communication: Mobility status;Other (comment)(Patient dizziness and symptoms) PT Visit Diagnosis: Pain;Other abnormalities of gait and mobility (R26.89);Dizziness and giddiness (R42) Pain - Right/Left: Right Pain - part of body: Arm(BLE )    Time: 7530-0511 PT Time Calculation (min) (ACUTE ONLY): 25 min   Charges:   PT Evaluation $PT Eval Moderate  Complexity: 1 Mod PT Treatments $Therapeutic Activity: 8-22 mins        Vanessa Ralphs, SPT  Vanessa Ralphs 12/30/2018, 1:07 PM

## 2018-12-30 NOTE — Consult Note (Addendum)
Cardiology Consultation:   Patient ID: Darrell King MRN: [id number]; DOB: 04-29-1966  Admit date: 12/29/2018 Date of Consult: 12/30/2018  Primary Care Provider: Burna Cash, MD Primary Cardiologist: New to [facility] Primary Electrophysiologist:  None    Patient Profile:   Darrell King is a 53 y.o. African-American male with no prior cardiac history but personal history of hypertension, hyperlipidemia, vasovagal syncope, Jehovah's Witness refusing blood products, rotator cuff disorder status post surgery who is being seen today for the evaluation of right arm and chest pain as well as syncope at the request of Dr. Oswaldo Done, internal medicine.   History of Present Illness:   Darrell King is a 53 y.o. African-American male with no prior cardiac history but personal history of hypertension, hyperlipidemia, vasovagal syncope, Jehovah's Witness refusing blood products, rotator cuff disorder status post surgery who is being seen today for the evaluation of right arm and chest pain as well as syncope at the request of Dr. Oswaldo Done, internal medicine.  He denies any prior cardiac history.  He does have hypertension and hyperlipidemia for which he takes medications for.  He denies any known history of diabetes.  Denies any tobacco use.  He does have a family history of CAD however not premature CAD.  His mother has had multiple myocardial infarctions but this occurred later in life, in her late 80s.  No other known family history of cardiac disease.  Based on patient's description he does have a history of syncope which sounds most consistent with vasovagal syncope.  His syncopal spells have been associated with prodromes, mainly hot flushing sensation and also in the setting of dry status is a guide heaving.  Patient reports that he tore his rotator cuff and had surgery in September 2019.  Since undergoing surgery he has struggled with significant right shoulder and arm pain.  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  The other day while at work he had significant right shoulder pain which he felt was from his rotator cuff.  It was however slightly worse than his usual pain.  He started to walk around at work and the pain intensified but this time radiated to the central part of his chest, which was new.  It felt like chest pressure.  He had 1 of the nurses at work check his blood pressure and it was significantly elevated in the 170 systolic in the low 100s diastolic.  The patient is not entirely sure if this was unusual for him as he does not regularly keep a check on his blood pressure.  He did not feel short of breath at that time however he did develop nausea.  He went to the bathroom and tried to vomit.  The next thing he knew he was on the floor and had passed out.  EMS was called.  He recalls that they gave him nitroglycerin.  He was transported to the hospital.  He was also given morphine with some improvement in chest pain.  EKG showed sinus rhythm. Probable left atrial enlargement ST elev, probable normal early repol pattern.  He was admitted by internal medicine.  Cardiac enzymes were cycled and negative x3.  Initial plans were for outpatient referral to cardiology for further work-up however patient developed recurrent chest pain earlier today.  This was relieved with tramadol.  Cardiology now asked to evaluate.  Of note, echocardiogram was also done earlier today.  Left ventricular ejection fraction is normal at 60 to 65% with mild  concentric left ventricular hypertrophy.  RV is normal.  No increase in right ventricular wall thickness.  No significant valvular abnormalities.  No wall motion abnormalities noted.  Patient is currently chest pain-free at the moment.    Past Medical History:  Diagnosis Date  . Acute gout 12/31/2016  . Anxiety    "had taken Paxil; back in 1996; nothing since" (10/21/2013)  . Arthritis   . Asthma    asa child  . Dyslipidemia 03/11/2016    12.5% 10-year risk of heart disease or stroke, should be on a moderate/high intensity statin.    . Essential hypertension, benign 02/23/2016  . Refusal of blood transfusions as patient is Jehovah's Witness     Past Surgical History:  Procedure Laterality Date  . COLONOSCOPY W/ POLYPECTOMY    . INGUINAL HERNIA REPAIR Right 1995  . INGUINAL HERNIA REPAIR Left 11/12/2013   Procedure: HERNIA REPAIR INGUINAL INCARCERATED;  Surgeon: Adolph Pollack, MD;  Location: [facility] OR;  Service: General;  Laterality: Left;  . SHOULDER ARTHROSCOPY WITH SUBACROMIAL DECOMPRESSION, ROTATOR CUFF REPAIR AND BICEP TENDON REPAIR Right 07/14/2018   Procedure: RIGHT SHOULDER ARTHROSCOPY, DEBRIDEMENT, BICEPS TENODESIS, DISTAL CLAVICLE EXCISION AND MINI OPEN ROTATOR CUFF TEAR REPAIR;  Surgeon: Cammy Copa, MD;  Location: [facility] OR;  Service: Orthopedics;  Laterality: Right;     Home Medications:  Prior to Admission medications   Medication Sig Start Date End Date Taking? Authorizing Provider  naproxen sodium (ALEVE) 220 MG tablet Take 660 mg by mouth daily as needed (pain).   Yes [provider]  Colchicine 0.6 MG CAPS Take 1.2mg  once, then 1 hr later take 0.6mg . Then take 1 tab daily Patient not taking: Reported on 12/29/2018 12/31/16   Denton Brick, MD  cyclobenzaprine (FLEXERIL) 5 MG tablet 1 po q 12hrs prn Patient not taking: Reported on 12/29/2018 07/15/18   Cammy Copa, MD  cyclobenzaprine (FLEXERIL) 5 MG tablet 1 po q 12hrs prn Patient not taking: Reported on 12/29/2018 12/01/18   Cammy Copa, MD  cyclobenzaprine (FLEXERIL) 5 MG tablet 1 po q 12hrs prn Patient not taking: Reported on 12/29/2018 12/01/18   Cammy Copa, MD  losartan (COZAAR) 50 MG tablet Take 1 tablet (50 mg total) by mouth daily. Patient not taking: Reported on 12/29/2018 12/31/16   Denton Brick, MD  meloxicam [facility]) 15 MG tablet 1 po q d x 30 days Patient not taking: Reported on 12/29/2018 12/01/18   Cammy Copa,  MD  methocarbamol (ROBAXIN) 500 MG tablet Take 1 tablet (500 mg total) by mouth every 8 (eight) hours as needed for muscle spasms. Patient not taking: Reported on 12/29/2018 07/14/18   Cammy Copa, MD  oxyCODONE (OXY IR/ROXICODONE) 5 MG immediate release tablet Take 1 tablet (5 mg total) by mouth every 4 (four) hours as needed for severe pain. Patient not taking: Reported on 12/29/2018 07/14/18   Cammy Copa, MD  oxyCODONE (OXY IR/ROXICODONE) 5 MG immediate release tablet 1 po q 6 hrs prn pain Patient not taking: Reported on 12/29/2018 12/01/18   Cammy Copa, MD  oxyCODONE (OXY IR/ROXICODONE) 5 MG immediate release tablet 1 po q 6 hrs prn pain Patient not taking: Reported on 12/29/2018 12/01/18   Cammy Copa, MD  pravastatin (PRAVACHOL) 40 MG tablet Take 1 tablet (40 mg total) by mouth daily. Patient not taking: Reported on 12/29/2018 06/05/16   Servando Snare, MD    Inpatient Medications: Scheduled Meds: . aspirin EC  81  mg Oral Daily  . atorvastatin  40 mg Oral q1800  . enoxaparin (LOVENOX) injection  40 mg Subcutaneous Q24H  . lidocaine  1 patch Transdermal Q24H  . sodium chloride flush  3 mL Intravenous Q12H   Continuous Infusions: . sodium chloride     PRN Meds: sodium chloride, acetaminophen **OR** acetaminophen, ketorolac, ondansetron **OR** ondansetron (ZOFRAN) IV, polyethylene glycol, sodium chloride flush  Allergies:   No Known Allergies  Social History:   Social History   Socioeconomic History  . Marital status: Married    Spouse name: Not on file  . Number of children: 3  . Years of education: Not on file  . Highest education level: Not on file  Occupational History  . Occupation: Estate manager/land agent  Social Needs  . Financial resource strain: Not on file  . Food insecurity:    Worry: Not on file    Inability: Not on file  . Transportation needs:    Medical: Not on file    Non-medical: Not on file  Tobacco Use  . Smoking status: Never Smoker  .  Smokeless tobacco: Never Used  Substance and Sexual Activity  . Alcohol use: Yes    Alcohol/week: 1.0 - 2.0 standard drinks    Types: 1 - 2 Shots of liquor per week    Comment: occasionally.  . Drug use: No  . Sexual activity: Not on file  Lifestyle  . Physical activity:    Days per week: Not on file    Minutes per session: Not on file  . Stress: Not on file  Relationships  . Social connections:    Talks on phone: Not on file    Gets together: Not on file    Attends religious service: Not on file    Active member of club or organization: Not on file    Attends meetings of clubs or organizations: Not on file    Relationship status: Not on file  . Intimate partner violence:    Fear of current or ex partner: Not on file    Emotionally abused: Not on file    Physically abused: Not on file    Forced sexual activity: Not on file  Other Topics Concern  . Not on file  Social History Narrative   Enjoys bowling   Works as a Curator    Family History:    Family History  Problem Relation Age of Onset  . Colon cancer Mother        colon  . Cancer Sister        dont know what type     ROS:  Please see the history of present illness.   All other ROS reviewed and negative.     Physical Exam/Data:   Vitals:   12/30/18 0003 12/30/18 0536 12/30/18 1124 12/30/18 1158  BP: 117/77 123/76 (!) 154/86 (!) 142/89  Pulse: 62 (!) 59 64 (!) 57  Resp:  Temp: 97.6 F (36.4 C) 97.6 F (36.4 C) 98.3 F (36.8 C)   TempSrc: Oral Oral Oral   SpO2: 97% 95% 97% 98%  Weight:      Height:       No intake or output data in the 24 hours ending 12/30/18 1755 Last 3 Weights 12/29/2018 11/11/2018 07/14/2018  Weight (lbs) 310 lb 299 lb 12.8 oz 290 lb  Weight (kg) 140.615 kg 135.988 kg 131.543 kg     Body mass index is 33.22 kg/m.  General:  Well nourished, well  developed, in no acute distress HEENT: normal Lymph: no adenopathy Neck: no JVD Endocrine:  No thryomegaly Vascular: No  carotid bruits; FA pulses 2+ bilaterally without bruits  Cardiac:  normal S1, S2; RRR; no murmur  Lungs:  clear to auscultation bilaterally, no wheezing, rhonchi or rales  Abd: soft, nontender, no hepatomegaly  Ext: no edema Musculoskeletal:  No deformities, BUE and BLE strength normal and equal Skin: warm and dry  Neuro:  CNs 2-12 intact, no focal abnormalities noted Psych:  Normal affect   EKG:  The EKG was personally reviewed and demonstrates:  NSR  Telemetry:  Telemetry was personally reviewed and demonstrates:  Sinus bradycardia 50s  Relevant CV Studies: None   Laboratory Data:  Chemistry Recent Labs  Lab 12/29/18 1014 12/29/18 1554  NA 138  --   K 3.5  --   CL 106  --   CO2 23  --   GLUCOSE 102*  --   BUN 14  --   CREATININE 1.02 1.08  CALCIUM 8.9  --   GFRNONAA >60 >60  GFRAA >60 >60  ANIONGAP 9  --     Recent Labs  Lab 12/29/18 1014  PROT 7.1  ALBUMIN 3.8  AST 25  ALT 33  ALKPHOS 54  BILITOT 1.0   Hematology Recent Labs  Lab 12/29/18 1014 12/29/18 1554  WBC 4.6 4.8  RBC 5.23 5.55  HGB 14.7 15.6  HCT 43.9 47.4  MCV 83.9 85.4  MCH 28.1 28.1  MCHC 33.5 32.9  RDW 13.4 13.6  PLT 194 197   Cardiac Enzymes Recent Labs  Lab 12/29/18 1554 12/29/18 2010 12/30/18 0251 12/30/18 1219  TROPONINI <0.03 <0.03 <0.03 <0.03    Recent Labs  Lab 12/29/18 1023  TROPIPOC 0.01    BNPNo results for input(s): BNP, PROBNP in the last 168 hours.  DDimer No results for input(s): DDIMER in the last 168 hours.  Radiology/Studies:  Ct Head Wo Contrast  Result Date: 12/29/2018 CLINICAL DATA:  Syncope EXAM: CT HEAD WITHOUT CONTRAST TECHNIQUE: Contiguous axial images were obtained from the base of the skull through the vertex without intravenous contrast. COMPARISON:  10/21/2013 FINDINGS: Brain: No evidence of acute infarction, hemorrhage, hydrocephalus, extra-axial collection or mass lesion/mass effect. Vascular: No hyperdense vessel or unexpected calcification.  Skull: Normal. Negative for fracture or focal lesion. Sinuses/Orbits: No acute finding. Other: None. IMPRESSION: No acute intracranial pathology. Electronically Signed   By: Lauralyn Primes M.D.   On: 12/29/2018 11:47   Dg Chest Port 1 View  Result Date: 12/29/2018 CLINICAL DATA:  Chest pain EXAM: PORTABLE CHEST 1 VIEW COMPARISON:  11/16/2014 FINDINGS: Low lung volumes, bibasilar atelectasis. Mild vascular congestion/crowding. Heart is borderline in size. No acute bony abnormality. IMPRESSION: Low lung volumes with mild vascular congestion and bibasilar atelectasis. Electronically Signed   By: Charlett Nose M.D.   On: 12/29/2018 10:28    Assessment and Plan:   Darrell King is a 53 y.o. African-American male with no prior cardiac history but personal history of hypertension, hyperlipidemia, vasovagal syncope, Jehovah's Witness refusing blood products, rotator cuff disorder status post surgery who is being seen today for the evaluation of right arm and chest pain as well as syncope at the request of Dr. Oswaldo Done, internal medicine.  1.  Chest pain/shoulder pain: Mixed typical and atypical features.  His right shoulder pain is likely secondary to rotator cuff pathology however he did have recent anterior chest pain that is new.  His cardiac enzymes are negative x3 and  echocardiogram showed normal left ventricular EF and no wall motion abnormalities.  However he does have several cardiac risk factors including hypertension and hyperlipidemia.  There is a family history of CAD however not premature.  His mother had multiple myocardial infarctions but they occurred later in life, in her late 10s.  We will plan to conduct noninvasive ischemic testing.  Telemetry currently shows sinus bradycardia with heart rate in the 50s.  I think he would be a good candidate for coronary CTA to assess for underlying CAD. Will discuss w/ MD. NPO at midnight.   2.  Syncope: Based on the patient's description of syncopal episodes  both past and recent, his syncope seems most consistent with vasovagal syncope.  His echocardiogram is reassuring.  EF is normal.  No significant valvular abnormalities.  RV is normal.  No increased wall thickness.  As noted above we will plan ischemic evaluation given recent chest pain.  Thus far, telemetry has not shown any significant arrhythmias.      Signed, Robbie Lis, PA-C  12/30/2018 5:55 PM   ATTENDING ATTESTATION  I have seen, examined and evaluated the patient this PM along with Robbie Lis, PA-C .  After reviewing all the available data and chart, we discussed the patients laboratory, study & physical findings as well as symptoms in detail. I agree with her findings, examination as well as impression recommendations as per our discussion.    We discussed the findings, history and recommendations noted above.  Somewhat atypical symptoms of chest pain albeit concerning for possible CAD.  I agree the best diagnostic choice would be coronary CTA to allow for most accurate diagnosis. Syncope is probably vasovagal based on previous reports and this current episode is consistent with that.  However we will continue to follow on telemetry states any signs of arrhythmias.  May consider outpatient monitor if symptoms continue.  More recommendations based on results of coronary CTA tomorrow.  Would anticipate discharge if no significant stenosis noted.    Bryan Lemma, MD, M.S. Interventional Cardiologist   Pager # [phone] Phone # [phone] [address] [place], [place] [address]    For questions or updates, please contact [facility] Please consult [email or web address] for contact info under

## 2018-12-30 NOTE — Progress Notes (Signed)
   Subjective: Patient seen laying in bed this morning. Some pain in his shoulder but denies chest pain. He was later seen by PT. When he went to stand he became dizzy with headache, felt like he was going to pass out, and had associated chest pressure. He felt somewhat better when he laid down but the chest pain persisted.   Objective:  Vital signs in last 24 hours: Vitals:   12/30/18 0003 12/30/18 0536 12/30/18 1124 12/30/18 1158  BP: 117/77 123/76 (!) 154/86 (!) 142/89  Pulse: 62 (!) 59 64 (!) 57  Resp:  20 18 20   Temp: 97.6 F (36.4 C) 97.6 F (36.4 C) 98.3 F (36.8 C)   TempSrc: Oral Oral Oral   SpO2: 97% 95% 97% 98%  Weight:      Height:       Physical Exam Constitutional: NAD, appears comfortable Cardiovascular: RRR, no murmurs, rubs, or gallops.  Pulmonary/Chest: CTAB, no wheezes, rales, or rhonchi.  Extremities: Warm and well perfused. No edema.  Psychiatric: Normal mood and affect   Assessment/Plan:  53 yo M with HTN, HLD, and obesity presented with chest pain after a syncopal event while at work. Admitted for ACS rule out. Serial troponins and EKG reassuring.   Chest Pain: Mixed typical and atypical features, overall moderate risk for CAD with HTN, HLD, and obesity. He has never experienced chest pain before and has never had an ischemic evaluation. ACS ruled out with serial troponins and EKG.  Chest pain initially resolved, but recurred today while working with PT. Described it as a pressure in the center of his chest. Improved with rest. Repeat EKG during episode of chest pain demonstrated sinus bradycardia HR 55, no acute ischemic changes.  -- Initial plan for outpatient work up, but given recurrent chest pain with exertion, will consult cardiology inpatient  -- Repeat troponin  -- Checking lipid panel  -- Continue Lipitor 40 mg daily  -- Continue ASA 81 daily   Syncope / Presyncope: Patient developed dizziness after standing to work with PT this morning, felt  that he was going to pass out. Experienced syncope yesterday with associated chest pain and nausea. Unclear etiology. Doubt cardiogenic given NSR on telemetry during most recent episode. Possibly vasovagal vs. Orthostasis.  -- Checking orthostatics  -- Echocardiogram  -- Telemetry   Right Rotator Cuff Tendonopathy Ruptured Long Head of BicepsTendon: Seen on bedside ultrasound this morning.  -- Toradol prn -- PT / OT -- Outpatient follow up  FEN: No fluids, replete lytes prn, HH diet VTE ppx: Lovenox  Code Status: FULL   Dispo: Anticipated discharge in approximately 1-2 day(s).   Reymundo Poll, MD 12/30/2018, 12:55 PM Pager: [phone]

## 2018-12-30 NOTE — Progress Notes (Signed)
Patient complaining of headache after PT stood patient up. RN gave acetomorphine for headache. Patient then starts complaining of chest pressure that radiates to right arm. RN taking EKG, CBG, vital signs, patient did not wait nitro due to it not working yesterday, all with in normal limits at this time. MD notified no new orders at this time

## 2018-12-31 ENCOUNTER — Inpatient Hospital Stay (HOSPITAL_COMMUNITY): Payer: 59

## 2018-12-31 DIAGNOSIS — R072 Precordial pain: Secondary | ICD-10-CM

## 2018-12-31 LAB — CBC
HCT: 44.7 % (ref 39.0–52.0)
Hemoglobin: 14.4 g/dL (ref 13.0–17.0)
MCH: 27.5 pg (ref 26.0–34.0)
MCHC: 32.2 g/dL (ref 30.0–36.0)
MCV: 85.5 fL (ref 80.0–100.0)
PLATELETS: 197 10*3/uL (ref 150–400)
RBC: 5.23 MIL/uL (ref 4.22–5.81)
RDW: 13.3 % (ref 11.5–15.5)
WBC: 4.6 10*3/uL (ref 4.0–10.5)
nRBC: 0 % (ref 0.0–0.2)

## 2018-12-31 LAB — LIPID PANEL
CHOLESTEROL: 201 mg/dL — AB (ref 0–200)
HDL: 24 mg/dL — ABNORMAL LOW (ref >40)
LDL Cholesterol: 139 mg/dL — ABNORMAL HIGH (ref 0–99)
Total CHOL/HDL Ratio: 8.4 RATIO
Triglycerides: 191 mg/dL — ABNORMAL HIGH (ref <150)
VLDL: 38 mg/dL (ref 0–40)

## 2018-12-31 LAB — HEMOGLOBIN A1C
Hgb A1c MFr Bld: 5.4 % (ref 4.8–5.6)
Mean Plasma Glucose: 108.28 mg/dL

## 2018-12-31 LAB — BASIC METABOLIC PANEL
Anion gap: 5 (ref 5–15)
BUN: 11 mg/dL (ref 6–20)
CO2: 28 mmol/L (ref 22–32)
Calcium: 8.9 mg/dL (ref 8.9–10.3)
Chloride: 108 mmol/L (ref 98–111)
Creatinine, Ser: 1.16 mg/dL (ref 0.61–1.24)
GFR calc Af Amer: >60 mL/min (ref >60)
GFR calc non Af Amer: >60 mL/min (ref >60)
Glucose, Bld: 92 mg/dL (ref 70–99)
Potassium: 3.7 mmol/L (ref 3.5–5.1)
Sodium: 141 mmol/L (ref 135–145)

## 2018-12-31 LAB — VITAMIN B12: Vitamin B-12: 367 pg/mL (ref 180–914)

## 2018-12-31 MED ORDER — SODIUM CHLORIDE 0.9% FLUSH
10.0000 mL | Freq: Two times a day (BID) | INTRAVENOUS | Status: DC
  Administered 2018-12-31: 10 mL

## 2018-12-31 MED ORDER — AMLODIPINE BESYLATE 5 MG PO TABS
5.0000 mg | ORAL_TABLET | Freq: Every day | ORAL | Status: DC
  Administered 2018-12-31 – 2019-01-01 (×2): 5 mg via ORAL
  Filled 2018-12-31 (×2): qty 1

## 2018-12-31 MED ORDER — NITROGLYCERIN 0.4 MG SL SUBL
0.8000 mg | SUBLINGUAL_TABLET | Freq: Once | SUBLINGUAL | Status: AC
  Administered 2018-12-31: 0.8 mg via SUBLINGUAL

## 2018-12-31 MED ORDER — NITROGLYCERIN 0.4 MG SL SUBL
SUBLINGUAL_TABLET | SUBLINGUAL | Status: AC
  Filled 2018-12-31: qty 2

## 2018-12-31 MED ORDER — IOHEXOL 350 MG/ML SOLN
80.0000 mL | Freq: Once | INTRAVENOUS | Status: AC | PRN
  Administered 2018-12-31: 80 mL via INTRAVENOUS

## 2018-12-31 MED ORDER — SODIUM CHLORIDE 0.9% FLUSH: 10.0000 mL | INTRAVENOUS | Status: DC | PRN

## 2018-12-31 NOTE — Progress Notes (Addendum)
Physical Therapy Treatment Patient Details Name: Darrell King MRN: [id number] DOB: 18-Feb-1966 Today's Date: 12/31/2018    History of Present Illness Pt is a 53 y.o. male admitted 12/29/18 with chest pain and syncopal episode, pt also with c/o LUE weakness. Head CT without acute process. ECG unremarkable. Cardiac CT without evidence of CAD, normal coronary origin. Syncope attributed to vasovagal response; UE/chest pain attributed to be musculoskeletal-related. PMH includes HTN, R-side rotator cuff sx.    PT Comments    Pt continues to be limited by c/o severe headache and dizziness. Difficult to determine consistent alleviating/aggravating factors, as these seem to change. During session, pt standing EOB for orthostatic BP measurement (see values below); after standing ~90-120sec, pt with syncopal episode requriing assist to control descent to bed, pt quickly coming to and asked, "What happened?" BP elevated after episode. Further ambulation deferred secondary to this (RN aware). Pt reports he has been getting to bathroom with HHA from wife; encouraged them to call for staff assist.  Orthostatic BPs  Supine 146/88  Sitting 138/94  Return to sit, post-syncope 159/90      Follow Up Recommendations  No PT follow up;Supervision/Assistance - 24 hour     Equipment Recommendations  (TBD)    Recommendations for Other Services       Precautions / Restrictions Precautions Precautions: Fall;Other (comment) Precaution Comments: Syncopal episode standing 3/4 Restrictions Weight Bearing Restrictions: No    Mobility  Bed Mobility Overal bed mobility: Modified Independent Bed Mobility: Supine to Sit;Sit to Supine     Supine to sit: Modified independent (Device/Increase time) Sit to supine: Modified independent (Device/Increase time)      Transfers Overall transfer level: Needs assistance Equipment used: None Transfers: Sit to/from Stand Sit to Stand: Supervision          General transfer comment: Supervision for safety, no physical assist upon standing. Pt reaching to bed rail for steadying assist due to c/o dizziness. Pt standing ~2 min for orthostatic BP measurements, then with syncopal episode, requiring assist for controlled descent onto bed; pt immediately coming to saying, "What happened?"  Ambulation/Gait             General Gait Details: NT secondary to syncopal episode. Pt reports he required HHA to ambulate to/from bathroom with wife due to dizziness. Encouraged call for staff assist due to recent events and unsteadiness   Stairs             Wheelchair Mobility    Modified Rankin (Stroke Patients Only)       Balance Overall balance assessment: Needs assistance Sitting-balance support: No upper extremity supported;Feet supported Sitting balance-Leahy Scale: Good     Standing balance support: No upper extremity supported Standing balance-Leahy Scale: Fair                              Cognition Arousal/Alertness: Awake/alert Behavior During Therapy: Flat affect Overall Cognitive Status: Within Functional Limits for tasks assessed                                        Exercises      General Comments General comments (skin integrity, edema, etc.): Wife present      Pertinent Vitals/Pain Pain Assessment: Faces Faces Pain Scale: Hurts little more Pain Location: Head Pain Descriptors / Indicators: Headache;Grimacing Pain Intervention(s): Limited activity within patient's  tolerance    Home Living                      Prior Function            PT Goals (current goals can now be found in the care plan section) Acute Rehab PT Goals Patient Stated Goal: "get out of hospital" PT Goal Formulation: With patient Time For Goal Achievement: 01/13/19 Potential to Achieve Goals: Good Progress towards PT goals: Progressing toward goals    Frequency    Min 3X/week      PT Plan  Discharge plan needs to be updated    Co-evaluation              AM-PAC PT "6 Clicks" Mobility   Outcome Measure  Help needed turning from your back to your side while in a flat bed without using bedrails?: None Help needed moving from lying on your back to sitting on the side of a flat bed without using bedrails?: None Help needed moving to and from a bed to a chair (including a wheelchair)?: None Help needed standing up from a chair using your arms (e.g., wheelchair or bedside chair)?: A Little Help needed to walk in hospital room?: A Little Help needed climbing 3-5 steps with a railing? : A Lot 6 Click Score: 20    End of Session   Activity Tolerance: Treatment limited secondary to medical complications (Comment) Patient left: in bed;with call bell/phone within reach;with family/visitor present Nurse Communication: Mobility status;Other (comment)(syncopal episode; vital signs) PT Visit Diagnosis: Other abnormalities of gait and mobility (R26.89);Dizziness and giddiness (R42)     Time: 0086-7619 PT Time Calculation (min) (ACUTE ONLY): 18 min  Charges:  $Therapeutic Activity: 8-22 mins                    Darrell King, PT, DPT Acute Rehabilitation Services  Pager [phone] Office [phone]  Darrell King 12/31/2018, 3:19 PM

## 2018-12-31 NOTE — Progress Notes (Signed)
Notified Dr. Maryla Morrow of physical therapy witnessed syncopal episode. Pt HR rate did not change on heart monitor. BP did not drop, elevated after episode. Cont to monitor. Emelda Brothers RN

## 2018-12-31 NOTE — Progress Notes (Signed)
Brief cardiology progress note:  Seen after syncopal event with PT. Discussed with therapist. Telemetry reviewed. No arrhythmia at all, sinus rhythm without pause, no ectopy.  Reviewed results of coronary CTA with patient. No obstructive plaque. 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 Kenard Gower this out for patient and discussed at length. The symptoms of this would be related to ischemia; with negative troponins and no arrhythmias or ECG changes, I do not think that his myocardial bridge is the cause of his symptoms. I suspect this is an incidental finding, and his symptoms are not cardiac in nature. Guidelines for management include beta blockers, but he cannot tolerate this due to resting bradycardia. Will start amlodipine for hypertension. Avoid nitrates.   From a cardiovascular standpoint, he is ok for discharge. The 10-year ASCVD risk score Denman George DC Montez Hageman., et al., 2013) is: 16.7%   Values used to calculate the score:     Age: 27 years     Sex: Male     Is Non-Hispanic African American: Yes     Diabetic: No     Tobacco smoker: No     Systolic Blood Pressure: 159 mmHg     Is BP treated: Yes     HDL Cholesterol: 24 mg/dL     Total Cholesterol: 201 mg/dL  [facility] will sign off.   Medication Recommendations:  Given ASCVD risk and LDL, would continue statin at discharge. Aspirin is optional. Starting amlodipine 5 mg. Other recommendations (labs, testing, etc):  None. Had witnessed syncope while on telemetry and staff present, no arrhythmia, no hypotension, so low yield for long term monitor. Follow up as an outpatient:  We will arrange for post discharge follow up.  Jodelle Red, MD, PhD [facility]  [address], [address] [place], [place] [address] [phone]

## 2018-12-31 NOTE — Progress Notes (Signed)
Progress Note  Patient Name: Darrell King Date of Encounter: 12/31/2018  Primary Cardiologist: New--Harding  Subjective   Patient had just received midline and developed chest discomfort. No associated symptoms. ECG unremarkable. Discussed plans for CT cardiac for definitive anatomy assessment, he is amenable.  Inpatient Medications    Scheduled Meds: . aspirin EC  81 mg Oral Daily  . atorvastatin  40 mg Oral q1800  . enoxaparin (LOVENOX) injection  40 mg Subcutaneous Q24H  . lidocaine  1 patch Transdermal Q24H  . sodium chloride flush  10-40 mL Intracatheter Q12H  . sodium chloride flush  3 mL Intravenous Q12H   Continuous Infusions: . sodium chloride     PRN Meds: sodium chloride, acetaminophen **OR** acetaminophen, ketorolac, ondansetron **OR** ondansetron (ZOFRAN) IV, polyethylene glycol, sodium chloride flush, sodium chloride flush   Vital Signs    Vitals:   12/30/18 2035 12/30/18 2044 12/31/18 0527 12/31/18 0527  BP:  (!) 155/89 (!) 126/59   Pulse:  (!) 58 (!) 53   Resp:      Temp:  97.9 F (36.6 C)  (!) 97.5 F (36.4 C)  TempSrc:  Oral  Oral  SpO2:  99% 97%   Weight: 131.9 kg   131.5 kg  Height:        Intake/Output Summary (Last 24 hours) at 12/31/2018 1023 Last data filed at 12/31/2018 0500 Gross per 24 hour  Intake -  Output 0 ml  Net 0 ml   Last 3 Weights 12/31/2018 12/30/2018 12/29/2018  Weight (lbs) 290 lb 290 lb 11.2 oz 310 lb  Weight (kg) 131.543 kg 131.861 kg 140.615 kg      Telemetry    Sinus bradycardia - Personally Reviewed  ECG    ECG today sinus bradycardia, precordial lead reversal vs. Poor contact - Personally Reviewed  Physical Exam   GEN: No acute distress.   Neck: No JVD Cardiac: RRR, no murmurs, rubs, or gallops.  Respiratory: Clear to auscultation bilaterally. GI: Soft, nontender, non-distended  MS: No edema; No deformity. Neuro:  Nonfocal  Psych: Normal affect   Labs    Chemistry Recent Labs  Lab 12/29/18 1014  12/29/18 1554  NA 138  --   K 3.5  --   CL 106  --   CO2 23  --   GLUCOSE 102*  --   BUN 14  --   CREATININE 1.02 1.08  CALCIUM 8.9  --   PROT 7.1  --   ALBUMIN 3.8  --   AST 25  --   ALT 33  --   ALKPHOS 54  --   BILITOT 1.0  --   GFRNONAA >60 >60  GFRAA >60 >60  ANIONGAP 9  --      Hematology Recent Labs  Lab 12/29/18 1014 12/29/18 1554  WBC 4.6 4.8  RBC 5.23 5.55  HGB 14.7 15.6  HCT 43.9 47.4  MCV 83.9 85.4  MCH 28.1 28.1  MCHC 33.5 32.9  RDW 13.4 13.6  PLT 194 197    Cardiac Enzymes Recent Labs  Lab 12/29/18 1554 12/29/18 2010 12/30/18 0251 12/30/18 1219  TROPONINI <0.03 <0.03 <0.03 <0.03    Recent Labs  Lab 12/29/18 1023  TROPIPOC 0.01     BNPNo results for input(s): BNP, PROBNP in the last 168 hours.   DDimer No results for input(s): DDIMER in the last 168 hours.   Radiology    Ct Head Wo Contrast  Result Date: 12/29/2018 CLINICAL DATA:  Syncope EXAM: CT  HEAD WITHOUT CONTRAST TECHNIQUE: Contiguous axial images were obtained from the base of the skull through the vertex without intravenous contrast. COMPARISON:  10/21/2013 FINDINGS: Brain: No evidence of acute infarction, hemorrhage, hydrocephalus, extra-axial collection or mass lesion/mass effect. Vascular: No hyperdense vessel or unexpected calcification. Skull: Normal. Negative for fracture or focal lesion. Sinuses/Orbits: No acute finding. Other: None. IMPRESSION: No acute intracranial pathology. Electronically Signed   By: Lauralyn Primes M.D.   On: 12/29/2018 11:47    Cardiac Studies   Echo 12/30/18 EF 60-65%. No WMA, normal valves  Patient Profile     53 y.o. male without prior cardiac history but risk factors of hypertension and hyperlipidemia presenting with right arm and chest discomfort as well as syncope.  Assessment & Plan    Chest and shoulder pain: atypical and typical components, but does have risk factors of hypertension and hyperlipidemia -rotator cuff history may be cause of  shoulder pain -had pain this AM while I was present, ECG without acute ST changes. -troponins negative -planned for cardiac CT today for anatomy evaluation. If no significant blockages, can likely be discharged home with primary care follow up.  Syncope: normal echo, baseline sinus bradycardia but no pauses or significant conduction abnormalities seen. Likely vasovagal.  Hypertension: was ordered for losartan at home but not taking. BP here has fluctuated slightly, but this AM well controlled on no medications.  Hyperlipidemia: no recent, adding to AM labs. Was ordered for pravastatin as an outpatient but not taking. Received atorvastatin this admission (though unlikely to have impacted baseline lipids after a single dose)  For questions or updates, please contact [facility] Please consult [email or web address] for contact info under    Signed, Jodelle Red, MD  12/31/2018, 10:23 AM

## 2018-12-31 NOTE — Progress Notes (Signed)
Subjective: Patient was seen and evaluated at bedside on morning rounds. He had 1 episode of chest pain that resolved. No weakness or numbness.  2:05 PM visit: He came back from CT coronary. He passed out when PT was trying to work with him. He does not remember what happened but recalls lightheadedness before that. He denies any chest pain prior or when he had lightheadedness. No palpitation. He also denies any melena, diarrhea, bloody stool or any other bleeding.  Objective:  Vital signs in last 24 hours: Vitals:   12/30/18 2035 12/30/18 2044 12/31/18 0527 12/31/18 0527  BP:  (!) 155/89 (!) 126/59   Pulse:  (!) 58 (!) 53   Resp:      Temp:  97.9 F (36.6 C)  (!) 97.5 F (36.4 C)  TempSrc:  Oral  Oral  SpO2:  99% 97%   Weight: 131.9 kg   131.5 kg  Height:       Physical Exam Vitals signs reviewed.  Constitutional:      Appearance: He is well-developed.  Eyes:     Extraocular Movements: Extraocular movements intact.  Cardiovascular:     Rate and Rhythm: Bradycardia present.     Heart sounds: No murmur.  Pulmonary:     Effort: Pulmonary effort is normal.  Abdominal:     Palpations: Abdomen is soft.     Tenderness: There is no abdominal tenderness.  Musculoskeletal:        General: Tenderness present.     Comments: Right anterior chest wall tenderness. ROM of right shoulder is painful.  Neurological:     Mental Status: He is alert and oriented to person, place, and time.    Assessment/Plan:  Principal Problem:   Chest pain Active Problems:   Tendinopathy of right rotator cuff   Essential hypertension, benign   Dyslipidemia   Syncope  53 yo male with HTN, HLD, and obesity presented with chest pain and a syncopal event while at work.   Chest Pain:  Mixed typical and atypical features.  Likely musculoskeletal  Serial troponins and EKG were reassuring. Patient with moderate risk for CAD (HTN, HLD, obesity and family Hx of CAD). Planned to do non invasive  ischemic work up with CT coronary.   Chest pain initially resolved but he had few episodes of chest pain then(including when he was working with PT). Moving his right shoulder reproduce the pain. EKG at pain showed sinus bradycardia without ischemic changes. Cardiology is on board -CT coronary--> Ca score 0, no evidence of CAD -Checking lipid panel --> total cholesterol 201, HDL 24, LDL 139 -Hemoglobin A1c-->5.2 -Continue Lipitor 40 mg daily  -Continue ASA 81 daily  -continue cardiac monitoring  Syncope: Happened this p.m., when patient was getting up from the bed for physical therapy.  Blood pressure was 150s/80s.  Attempted for orthostatic, but was not possible.  Reviewing telemetry, showed same sinus bradycardia around 50s without any sinus pauses or blocks. (no change in the rate and rhythm around the syncopal episode time.  Cardiogenic syncope is less likely considering no change on tele. Echo was also normal. PE is less likely: no tachy cardia Can be Autonomic dysfunction vs vasovagal.  Will Check basic lab and Hb.  -CBC -BMP -B12 -Continue cardiac monitoring -PT eval and treat   Right Rotator Cuff Tendonopathy Ruptured Long Head of BicepsTendon: Seen on bedside ultrasound yesterday.  -Toradol prn while in hospital - Outpatient follow up  FEN: No fluids, replete lytes prn, HH diet VTE  ppx: Lovenox  Code Status: FULL     Dispo: Anticipated discharge tomorrow  Chevis Pretty, MD 12/31/2018, 8:49 AM Pager: [phone]

## 2019-01-01 ENCOUNTER — Telehealth: Payer: Self-pay | Admitting: Internal Medicine

## 2019-01-01 DIAGNOSIS — Z79899 Other long term (current) drug therapy: Secondary | ICD-10-CM

## 2019-01-01 LAB — CBC
HCT: 46.8 % (ref 39.0–52.0)
Hemoglobin: 15.1 g/dL (ref 13.0–17.0)
MCH: 27.8 pg (ref 26.0–34.0)
MCHC: 32.3 g/dL (ref 30.0–36.0)
MCV: 86 fL (ref 80.0–100.0)
Platelets: 213 10*3/uL (ref 150–400)
RBC: 5.44 MIL/uL (ref 4.22–5.81)
RDW: 13.6 % (ref 11.5–15.5)
WBC: 5.8 10*3/uL (ref 4.0–10.5)
nRBC: 0 % (ref 0.0–0.2)

## 2019-01-01 LAB — BASIC METABOLIC PANEL
Anion gap: 6 (ref 5–15)
BUN: 13 mg/dL (ref 6–20)
CO2: 27 mmol/L (ref 22–32)
Calcium: 9.3 mg/dL (ref 8.9–10.3)
Chloride: 105 mmol/L (ref 98–111)
Creatinine, Ser: 1.16 mg/dL (ref 0.61–1.24)
GFR calc Af Amer: >60 mL/min (ref >60)
GFR calc non Af Amer: >60 mL/min (ref >60)
Glucose, Bld: 93 mg/dL (ref 70–99)
Potassium: 3.8 mmol/L (ref 3.5–5.1)
Sodium: 138 mmol/L (ref 135–145)

## 2019-01-01 MED ORDER — ASPIRIN 81 MG PO TBEC: 81.0000 mg | DELAYED_RELEASE_TABLET | Freq: Every day | ORAL | 0 refills | Status: DC

## 2019-01-01 MED ORDER — AMLODIPINE BESYLATE 5 MG PO TABS: 5.0000 mg | ORAL_TABLET | Freq: Every day | ORAL | 0 refills | Status: DC

## 2019-01-01 MED ORDER — ATORVASTATIN CALCIUM 40 MG PO TABS: 40.0000 mg | ORAL_TABLET | Freq: Every day | ORAL | 0 refills | Status: DC

## 2019-01-01 NOTE — Telephone Encounter (Signed)
hosp f/u per Dr Nelson Chimes; pt appt 03/12 1045am/NW

## 2019-01-01 NOTE — Discharge Summary (Addendum)
Name: Darrell King MRN: [id number] DOB: [birth year] 53 y.o. PCP: Burna Cash, MD  Date of Admission: 12/29/2018  9:51 AM Date of Discharge: 01/01/2019 Attending Physician: Erlinda Hong MD  Discharge Diagnosis: 1. Atypical chest pain  Discharge Medications: Allergies as of 01/01/2019   No Known Allergies     Medication List    STOP taking these medications   Colchicine 0.6 MG Caps   cyclobenzaprine 5 MG tablet Commonly known as:  FLEXERIL   losartan 50 MG tablet Commonly known as:  COZAAR   meloxicam 15 MG tablet Commonly known as:  MOBIC   methocarbamol 500 MG tablet Commonly known as:  ROBAXIN   oxyCODONE 5 MG immediate release tablet Commonly known as:  Oxy IR/ROXICODONE   pravastatin 40 MG tablet Commonly known as:  PRAVACHOL     TAKE these medications   amLODipine 5 MG tablet Commonly known as:  NORVASC Take 1 tablet (5 mg total) by mouth daily. Start taking on:  January 02, 2019   aspirin 81 MG EC tablet Take 1 tablet (81 mg total) by mouth daily. Start taking on:  January 02, 2019   atorvastatin 40 MG tablet Commonly known as:  LIPITOR Take 1 tablet (40 mg total) by mouth daily at 6 PM.   naproxen sodium 220 MG tablet Commonly known as:  ALEVE Take 660 mg by mouth daily as needed (pain).       Disposition and follow-up:   DarrellDimarco B King was discharged from [facility] in Stable condition.  At the hospital follow up visit please address:  1. Chest pain: Mr. Prorok should begin a heart healthy diet and exercise with a target of weight loss to prevent future cardiovascular disease. Follow-up to ensure that chest pain has resolved.  2. Syncope: Mr. Hecht was stable on discharge, but may have future syncopal episodes. A targeted history and evaluation could further elucidate the cause of his syncope.  3. Rotator cuff tendinopathy and potential biceps tendon rupture: Mr. Hornbostel is concerned about continued sensory  changes in his right shoulder. He may benefit from a visit with his orthopedic surgeon. 4.  Labs / imaging needed at time of follow-up: none 5.  Pending labs/ test needing follow-up: none  Follow-up Appointments: Follow-up Information    Burna Cash, MD. Go on 01/08/2019.   Specialty:  Internal Medicine Why:  At 10:45 AM Contact information: [address] [place] [address] [phone]           Hospital Course by problem list: 1. Atypical chest pain: [name] noticed pain in his right shoulder on 3/2. The pain was 10/10 and he noticed some weakness in the arm. A few minutes later, the pain spread across his chest, feeling like "a pressure". The pain did not radiate and was not worse with breathing. At that time, his blood pressure was 170/103. He had nausea and fainted as he was preparing to throw up. He was brought to the emergency room. He was treated with nitroglycerin, aspirin, and morphine, which did not resolve his pain. His pain resolved within a few hours of admission. Initial EKG showed likely early repolarization. Blood pressure in the ED was 134/81. Troponin was < 0.03 repeated x3. CT of head w/o contrast showed no acute intracranial pathology. Chest xray showed mild vascular congestion and bibasilar atelectasis. On 3/3, echocardiogram showed normal LV EF with no wall motion abnormalities noticed. On 3/3, Darrell King noticed recurrent pain or pressure in his chest  every time that he stood up. The pain resolved with tramadol. On 3/4, Coronary angiography showed no signs of coronary artery disease. Mr. Ostrand was initiated on medicines for ACS prevention, and was discharged on atorvastatin, amlodipine, and aspirin. He was encouraged to exercise regularly and eat a diet full of vegetables, fruits, and whole grains.   2. Syncope: Darrell King began to have syncopal episodes while in the hospital. His first episode was on 3/3. It occurred while he was rising from the bed. Mr.  King experienced lightheadedness, chest pressure, and headache. He fell back onto the bed, awakening within seconds without confusion. No seizure activity was noticed. Darrell King had another two syncopal episodes on 3/4, both related to rising from bed. In each episode, his EKG was unchanged and his blood pressure was unaffected. Darrell King was able to ambulate in the evening of 3/4 and the morning of 3/5 without difficulty. He has a history of syncopal episodes related to stress. We discussed the importance of hydration, finding a safe place to sit when feeling lightheaded, the use of countermeasures to prevent vaso-vagal syncope, and to lie flat with elevated feet should he faint at home. Given his negative cardiac workup and normal EKGs during the syncopal episodes, no further workup was done.  3. Hypertension: Darrell King has chronic hypertension, treated with amlodipine  daily. This treatment was continued throughout his time in the hospital and was prescribed at discharge.   Discharge Vitals:   BP 131/89 (BP Location: Left Arm)   Pulse 60   Temp 97.6 F (36.4 C) (Oral)   Resp 20   Ht  (2.057 m)   Wt 131.5 kg   SpO2 98%   BMI 31.08 kg/m   Pertinent Labs, Studies, and Procedures:  CBC Latest Ref Rng & Units 01/01/2019 12/31/2018 12/29/2018  WBC 4.0 - 10.5 K/uL 5.8 4.6 4.8  Hemoglobin 13.0 - 17.0 g/dL 45.4 09.8 11.9  Hematocrit 39.0 - 52.0 % 46.8 44.7 47.4  Platelets 150 - 400 K/uL 213 197 197   CMP Latest Ref Rng & Units 01/01/2019 12/31/2018 12/29/2018  Glucose 70 - 99 mg/dL 93 92 -  BUN 6 - 20 mg/dL 13 11 -  Creatinine 1.47 - 1.24 mg/dL 8.29 5.62 1.30  Sodium 135 - 145 mmol/L 138 141 -  Potassium 3.5 - 5.1 mmol/L 3.8 3.7 -  Chloride 98 - 111 mmol/L 105 108 -  CO2 22 - 32 mmol/L 27 28 -  Calcium 8.9 - 10.3 mg/dL 9.3 8.9 -  Total Protein 6.5 - 8.1 g/dL - - -  Total Bilirubin 0.3 - 1.2 mg/dL - - -  Alkaline Phos 38 - 126 U/L - - -  AST 15 - 41 U/L - - -  ALT 0 - 44 U/L - - -    Lipid Panel     Component Value Date/Time   CHOL 201 (H) 12/31/2018 1200   CHOL 231 (H) 03/05/2016 1503   TRIG 191 (H) 12/31/2018 1200   HDL 24 (L) 12/31/2018 1200   HDL 34 (L) 03/05/2016 1503   CHOLHDL 8.4 12/31/2018 1200   VLDL 38 12/31/2018 1200   LDLCALC 139 (H) 12/31/2018 1200   LDLCALC 149 (H) 03/05/2016 1503   EKG Interpretation  Date/Time:  Tuesday December 30 2018 11:54:40 EST Ventricular Rate:  55 PR Interval:  186 QRS Duration: 88 QT Interval:  418 QTC Calculation: 399 R Axis:   38 Text Interpretation:  Sinus bradycardia Otherwise normal ECG Confirmed by Jodi Mourning,  Ivin Booty [phone]) on 12/31/2018 8:09:40 PM   Troponins <0.03 x3  Ct Coronary Morph W/cta Cor W/score W/ca W/cm &/or Wo/cm  Addendum Date: 12/31/2018   ADDENDUM REPORT: 12/31/2018 12:45 CLINICAL DATA:  85M with hypertension, hyperlipidemia, and chest pain. EXAM: Cardiac/Coronary  CT TECHNIQUE: The patient was scanned on a [employer]. FINDINGS: A 120 kV prospective scan was triggered in the descending thoracic aorta at 111 HU's. Axial non-contrast 3 mm slices were carried out through the heart. The data set was analyzed on a dedicated work station and scored using the Agatson method. Gantry rotation speed was 250 msecs and collimation was .6 mm. No beta blockade and 0.8 mg of sl NTG was given. The 3D data set was reconstructed in 5% intervals of the 67-82 % of the R-R cycle. Diastolic phases were analyzed on a dedicated work station using MPR, MIP and VRT modes. The patient received 80 cc of contrast. Aorta: Normal size. Ascending aorta 3.2 cm. No calcifications. No dissection. Aortic Valve:  Trileaflet.  No calcifications. Coronary Arteries:  Normal coronary origin.  Right dominance. RCA is a large dominant artery that gives rise to PDA and PLVB. There is no plaque. Left main is a large artery that gives rise to LAD and LCX arteries. LAD is a large vessel that has no plaque. 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 There is no plaque in the diagonal vessels. LCX is a non-dominant artery that gives rise to one large OM1 branch. There is no plaque. Other findings: Normal pulmonary vein drainage into the left atrium. Normal let atrial appendage without a thrombus. Normal size of the pulmonary artery. IMPRESSION: 1. Coronary calcium score of 0. This was 0 percentile for age and sex matched control. 2. Normal coronary origin with right dominance. 3. No evidence of CAD. 4. 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 Chilton Si, MD Electronically Signed   By: Chilton Si   On: 12/31/2018 12:45   Result Date: 12/31/2018 EXAM: OVER-READ INTERPRETATION  CT CHEST The following report is an over-read performed by radiologist Dr. Charlett Nose of [facility] on 12/31/2018. This over-read does not include interpretation of cardiac or coronary anatomy or pathology. The coronary CTA interpretation by the cardiologist is attached. COMPARISON:  None. FINDINGS: Vascular: Heart is normal size.  Visualized aorta normal caliber. Mediastinum/Nodes: No adenopathy in the lower mediastinum or hila. Lungs/Pleura: No confluent opacities or effusions. Upper Abdomen: Imaging into the upper abdomen shows no acute findings. Musculoskeletal: Chest wall soft tissues are unremarkable. No acute bony abnormality. IMPRESSION: No acute or significant extracardiac abnormality. Electronically Signed: By: Charlett Nose M.D. On: 12/31/2018 11:43     Discharge Instructions: Discharge Instructions    Diet - low sodium heart healthy   Complete by:  As directed    Discharge instructions   Complete by:  As directed    It was pleasure taking care of you. Your cardiologist added a small dose of blood pressure medicine called amlodipine 5 mg daily and a cholesterol medicine called Lipitor 40 mg daily, please take it as instructed. We make an appointment to be seen in the  clinic on 01/08/19 at 10:45 AM for your follow-up visit. As we instructed you please take it slowly if you become dizzy and keep yourself well-hydrated.   Increase activity slowly   Complete by:  As directed  Signed: Helayne Seminole, Medical Student 01/01/2019, 2:03 PM   Pager: [phone]  Discharge summary was done by medical student Dierdre Harness under supervision. We formulated the orders and plan together.  Arnetha Courser MD PGY3 Pager [phone]

## 2019-01-01 NOTE — Plan of Care (Signed)

## 2019-01-01 NOTE — Progress Notes (Signed)
   Subjective: No acute events overnight. Darrell King is doing well today. He has not had another episode of syncope since 2pm yesterday. He has been able to ambulate well this morning. He has not had any more chest pain. He is currently asymptomatic.   Objective:  Vital signs in last 24 hours: Vitals:   12/31/18 1348 12/31/18 1351 12/31/18 2013 01/01/19 0531  BP: (!) 138/94 (!) 159/90 (!) 142/84 131/89  Pulse:   64 60  Resp:   18 20  Temp:   98.5 F (36.9 C) 97.6 F (36.4 C)  TempSrc:   Oral Oral  SpO2:   97% 98%  Weight:      Height:       Cardiovascular: regular rate and rhythm, no murmurs, thrills, or heaves. No orthostatic symptoms with rising from bed. Respiratory: Lungs are clear to auscultation bilaterally MSK: Strength intact in upper and lower extremities bilaterally. Gait is normal  Assessment/Plan:  Principal Problem:   Chest pain Active Problems:   Tendinopathy of right rotator cuff   Essential hypertension, benign   Dyslipidemia   Syncope  Chest pain: Darrell King has completed a cardiac workup without any concerning findings. His troponins were not elevated. His EKG was normal. His coronary angiogram was unconcerning. No further workup is indicated at this time.  Syncope: Darrell King has not had another episode of syncope since 3/4. He has a history of similar syncopal episodes related to stress. Given his normal EKG and BP during the syncopal events, and his negative cardiac workup for chest pain, Darrell King' syncope is low risk. No further workup needed at this time --discussed importance of hydration --discussed counterpressure measures to prevent vasovagal syncope  Tendinopathy of right rotator cuff --schedule follow-up with your orthopedic surgeon to discuss options for treatment of torn biceps tendon  Dispo: Anticipated discharge is today  LOS: 2 days   Helayne Seminole, Medical Student 01/01/2019, 11:17 AM Pager: [phone]

## 2019-01-08 ENCOUNTER — Ambulatory Visit: Payer: 59

## 2019-01-08 ENCOUNTER — Encounter: Payer: Self-pay | Admitting: Internal Medicine

## 2019-02-12 ENCOUNTER — Encounter (INDEPENDENT_AMBULATORY_CARE_PROVIDER_SITE_OTHER): Payer: Self-pay | Admitting: Orthopedic Surgery

## 2019-02-16 ENCOUNTER — Ambulatory Visit: Payer: 59 | Admitting: Adult Health

## 2019-03-16 ENCOUNTER — Encounter: Payer: Self-pay | Admitting: Orthopedic Surgery

## 2019-03-20 ENCOUNTER — Ambulatory Visit: Payer: 59 | Admitting: Orthopedic Surgery

## 2019-04-08 ENCOUNTER — Ambulatory Visit (INDEPENDENT_AMBULATORY_CARE_PROVIDER_SITE_OTHER): Payer: 59 | Admitting: Orthopedic Surgery

## 2019-04-08 ENCOUNTER — Other Ambulatory Visit: Payer: Self-pay

## 2019-04-08 ENCOUNTER — Encounter: Payer: Self-pay | Admitting: Orthopedic Surgery

## 2019-04-08 ENCOUNTER — Ambulatory Visit (INDEPENDENT_AMBULATORY_CARE_PROVIDER_SITE_OTHER): Payer: 59

## 2019-04-08 DIAGNOSIS — M25511 Pain in right shoulder: Secondary | ICD-10-CM | POA: Diagnosis not present

## 2019-04-08 NOTE — Progress Notes (Signed)
Office Visit Note   Patient: Darrell King           Date of Birth: Mar 13, 1966           MRN: [id number] Visit Date: 04/08/2019 Requested by: Burna CashSantos-Sanchez, Idalys, MD [address] [place], [place] [address] PCP: Burna CashSantos-Sanchez, Idalys, MD  Subjective: Chief Complaint  Patient presents with  . Right Shoulder - Pain    HPI: Darrell MostCharles is a patient with right shoulder pain.  Had surgery last year which was rotator cuff repair distal clavicle excision and biceps tenodesis.  He reports having a period of several months where he was doing well with the right shoulder but now he reports pain and inability to raise the arm.  Taking ibuprofen without relief.  He is right-hand dominant.  He does physical work as a Production designer, theatre/television/filmmaintenance person.              ROS: All systems reviewed are negative as they relate to the chief complaint within the history of present illness.  Patient denies  fevers or chills.   Assessment & Plan: Visit Diagnoses:  1. Right shoulder pain, unspecified chronicity     Plan: Impression is right shoulder pain with possible disruption of that biceps tenodesis and spurring of the superior aspect of the distal clavicle.  More concerning is the pain and the potential for rerupture of that rotator cuff based on the very heavy physical work that he is doing.  I would like to get an MRI arthrogram of that right shoulder to evaluate the integrity of the cuff repair.  He has reasonable strength on exam.  If the cuff repair is intact we could consider some injections.  I will see him back after that study.  Follow-Up Instructions: No follow-ups on file.   Orders:  Orders Placed This Encounter  Procedures  . XR Shoulder Right   No orders of the defined types were placed in this encounter.     Procedures: No procedures performed   Clinical Data: No additional findings.  Objective: Vital Signs: There were no vitals taken for this visit.  Physical Exam:   Constitutional:  Patient appears well-developed HEENT:  Head: Normocephalic Eyes:EOM are normal Neck: Normal range of motion Cardiovascular: Normal rate Pulmonary/chest: Effort normal Neurologic: Patient is alert Skin: Skin is warm Psychiatric: Patient has normal mood and affect    Ortho Exam: Ortho exam demonstrates well-healed surgical incisions around the right shoulder.  He does have more rounded contour of the biceps on the right than the left consistent with possible pulling away of that tenodesis biceps tendon.  He has no restriction of passive range of motion on that right-hand side.  No other masses lymphadenopathy or skin changes noted in that shoulder girdle region.  A little bit of coarseness with passive range of motion is noted.  Does have prominence of that distal end of the clavicle consistent with spurring formation at the distal end superiorly seen on radiographs.  Specialty Comments:  No specialty comments available.  Imaging: No results found.   PMFS History: Patient Active Problem List   Diagnosis Date Noted  . Syncope   . Chest pain 12/29/2018  . Other fatigue 11/11/2018  . Gout 12/31/2016  . Dyslipidemia 03/11/2016  . Essential hypertension, benign 02/23/2016  . 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 Numbness and tingling of right upper extremity 11/29/2014  . Patient is Jehovah's Witness 01/04/2014  . Health care maintenance 11/24/2013  . Left inguinal hernia  11/11/2013   Past Medical History:  Diagnosis Date  . Acute gout 12/31/2016  . Anxiety    "had taken Paxil; back in 1996; nothing since" (10/21/2013)  . Arthritis   . Asthma    asa child  . Dyslipidemia 03/11/2016   12.5% 10-year risk of heart disease or stroke, should be on a moderate/high intensity statin.    . Essential hypertension, benign 02/23/2016  . Refusal of blood transfusions as patient is Jehovah's Witness     Family History  Problem Relation Age of Onset  . Colon cancer Mother         colon  . Cancer Sister        dont know what type    Past Surgical History:  Procedure Laterality Date  . COLONOSCOPY W/ POLYPECTOMY    . INGUINAL HERNIA REPAIR Right 1995  . INGUINAL HERNIA REPAIR Left 11/12/2013   Procedure: HERNIA REPAIR INGUINAL INCARCERATED;  Surgeon: Odis Hollingshead, MD;  Location: [facility];  Service: General;  Laterality: Left;  . SHOULDER ARTHROSCOPY WITH SUBACROMIAL DECOMPRESSION, ROTATOR CUFF REPAIR AND BICEP TENDON REPAIR Right 07/14/2018   Procedure: RIGHT SHOULDER ARTHROSCOPY, DEBRIDEMENT, BICEPS TENODESIS, DISTAL CLAVICLE EXCISION AND MINI OPEN ROTATOR CUFF TEAR REPAIR;  Surgeon: Meredith Pel, MD;  Location: [facility];  Service: Orthopedics;  Laterality: Right;   Social History   Occupational History  . Occupation: Maintenance Tech  Tobacco Use  . Smoking status: Never Smoker  . Smokeless tobacco: Never Used  Substance and Sexual Activity  . Alcohol use: Yes    Alcohol/week: 1.0 - 2.0 standard drinks    Types: 1 - 2 Shots of liquor per week    Comment: occasionally.  . Drug use: No  . Sexual activity: Not on file

## 2019-04-09 ENCOUNTER — Encounter: Payer: Self-pay | Admitting: Orthopedic Surgery

## 2019-04-09 MED ORDER — TRAMADOL HCL 50 MG PO TABS: 50.0000 mg | ORAL_TABLET | Freq: Two times a day (BID) | ORAL | 0 refills | Status: DC | PRN

## 2019-04-09 NOTE — Addendum Note (Signed)
Addended byLaurann Montana on: 04/09/2019 11:23 AM   Modules accepted: Orders

## 2019-04-28 ENCOUNTER — Telehealth: Payer: Self-pay | Admitting: Orthopedic Surgery

## 2019-04-28 NOTE — Telephone Encounter (Signed)
Called patient to tell about appt for MRI REVIEW and vm was full not able to leave a message.

## 2019-05-13 ENCOUNTER — Ambulatory Visit (INDEPENDENT_AMBULATORY_CARE_PROVIDER_SITE_OTHER): Payer: 59 | Admitting: Internal Medicine

## 2019-05-13 ENCOUNTER — Encounter: Payer: Self-pay | Admitting: Internal Medicine

## 2019-05-13 ENCOUNTER — Other Ambulatory Visit: Payer: Self-pay

## 2019-05-13 VITALS — BP 143/104 | HR 80 | Temp 98.3°F | Ht >= 80 in | Wt 293.4 lb

## 2019-05-13 DIAGNOSIS — H6090 Unspecified otitis externa, unspecified ear: Secondary | ICD-10-CM | POA: Insufficient documentation

## 2019-05-13 DIAGNOSIS — H60501 Unspecified acute noninfective otitis externa, right ear: Secondary | ICD-10-CM

## 2019-05-13 DIAGNOSIS — H9201 Otalgia, right ear: Secondary | ICD-10-CM | POA: Diagnosis not present

## 2019-05-13 DIAGNOSIS — M542 Cervicalgia: Secondary | ICD-10-CM

## 2019-05-13 MED ORDER — CIPRODEX 0.3-0.1 % OT SUSP: 4.0000 [drp] | Freq: Two times a day (BID) | OTIC | 0 refills | Status: DC

## 2019-05-13 MED ORDER — CIPROFLOXACIN HCL 500 MG PO TABS: 500.0000 mg | ORAL_TABLET | Freq: Two times a day (BID) | ORAL | 0 refills | Status: AC

## 2019-05-13 NOTE — Progress Notes (Signed)
   CC: Ear pain  HPI:  Mr.Canden B Pavek is a 53 y.o. male who presents for acute right ear pain.  Pain extends to the pre-and postauricular areas, mastoid, and down the right lateral neck.  He also complains of throat pain.  Pain began acutely about 2 days ago.  He was in his usual state of health prior to this.  He denies any recent illnesses.  He denies any cough, fever, chills, rhinorrhea, sinus congestion, or eye pain.  Notes that it is difficult for him to open and close his mouth and swallow due to the pain.  He denies being in water for any extended period of time lately.  Denies any drainage from the ear.  No similar symptoms in the past.   Past Medical History:  Diagnosis Date  . Acute gout 12/31/2016  . Anxiety    "had taken Paxil; back in 1996; nothing since" (10/21/2013)  . Arthritis   . Asthma    asa child  . Dyslipidemia 03/11/2016   12.5% 10-year risk of heart disease or stroke, should be on a moderate/high intensity statin.    . Essential hypertension, benign 02/23/2016  . Refusal of blood transfusions as patient is Jehovah's Witness    Review of Systems:  negative other than those stated in HPI  Physical Exam:  Vitals:   05/13/19 1330  BP: (!) 143/104  Pulse: 80  Temp: 98.3 F (36.8 C)  SpO2: 100%  Weight: 293 lb 6.4 oz (133.1 kg)  Height: 6\' 9"  (2.057 m)    GENERAL: well appearing, in no apparent distress HEENT: Head atraumatic.  No conjunctival injection of the eyes.  No drainage from the eyes.  EOMs intact and without pain.  Left ear exam normal.  Right ear exhibits exquisite tenderness over the anterior external left ear.  External ear canal is erythematous.  There does appear to be debris present within the canal.  Difficult to visualize tympanic membrane however the small portion that I saw looked okay.  No drainage from the ear.  Nares patent no drainage.  No sinus tenderness.  No pharyngeal edema or erythema appreciated on exam.  Dentation intact.  No  lesions.  Some swelling of the lateral neck and preauricular area.  Right side of his neck is also very tender extending from lateral neck to anterior cervical chain.  No palpable lymphadenopathy.  No lesions noted on the skin. CARDIAC: heart regular rate and rhythm, no peripheral edema appreciated PULMONARY: Breathing comfortably on room air.  No stridor.  Lung sounds clear to auscultation.  Airway patent. SKIN: no rash or lesion on limited exam.  NEURO: CN II-XII grossly intact MUSCULOSKELETAL: Neck range of motion intact.  No increase in pain with these motions.   Assessment & Plan:   See Encounters Tab for problem based charting.  Pertinent labs & imaging results that were available during my care of the patient were reviewed by me and considered in my medical decision making  Patient is in agreement with the plan and endorses no further questions at this time.  Patient seen with Dr. Clista Bernhardt, MD Internal Medicine Resident-PGY1 05/13/19

## 2019-05-13 NOTE — Patient Instructions (Signed)
You most likely have inflammation of the outer ear--otitis externa. We will have you get a CT of your neck to make sure you don't have an infection or abscess that would require different treatment. We will also get some labs on you today. I have sent a couple of prescriptions for antibiotics to [facility]. Please complete these.

## 2019-05-13 NOTE — Progress Notes (Signed)
Internal Medicine Clinic Attending  I saw and evaluated the patient.  I personally confirmed the key portions of the history and exam documented by Dr. Darrick Meigs and I reviewed pertinent patient test results.  The assessment, diagnosis, and plan were formulated together and I agree with the documentation in the resident's note.  53 yo man here with pain and fullness of R ear and neck. Also having pain with chewing, talking, opening mouth, no drainage, no fever. Exam is significant for erythema of R ear canal, no middle ear effusion, pain with movement of the pinna, pain on palpation of the mastoid and right lateral neck, but no significant swelling or lymphadenopathy. Able to open mouth wide enough for pharyngeal exam, no asymmetry, normal appearing tonsils.   Most likely otitis externa, but differential includes mastoiditis, PTA, RPA, Lemierre's, no evidence of Ludwig's.   Will check BMP, CBC, CRP, ESR, and CT neck. Start ciprodex drops as well as oral ciprofloxacin given rapid progression and pain more significant than would expect.   Lenice Pressman, M.D., Ph.D.

## 2019-05-13 NOTE — Assessment & Plan Note (Signed)
Laramie presents today with 2-day history of right ear neck and throat pain.  Symptoms presented acutely and without preceding respiratory symptoms.  Pain has gradually worsened over the last couple days.  Afebrile.  Most likely diagnosis is otitis externa.  The physical exam did reveal quite a bit erythema of the external canal.  However given the extensiveness of the pain going all the way down the side of his neck and over the mastoid process along with the throat pain, further imaging is necessary to rule out an abscess or other more serious infection component to this.  Plan: CT neck with contrast ordered.  Labs will be drawn here in the clinic today including a CBC, BMP, ESR, CRP,.  We will treat this as otitis externa in the meantime with Ciprodex eardrops and Cipro 500 mg p.o. x7 days given the severity in nature.  I advised him that should he develop a fever at home, have difficulty breathing, or the pain worsens, he should report immediately to the ER for further management.

## 2019-05-14 ENCOUNTER — Ambulatory Visit
Admission: RE | Admit: 2019-05-14 | Discharge: 2019-05-14 | Disposition: A | Discharge status: Home / Self Care | Payer: 59 | Source: Ambulatory Visit | Attending: Orthopedic Surgery | Admitting: Orthopedic Surgery

## 2019-05-14 DIAGNOSIS — M25511 Pain in right shoulder: Secondary | ICD-10-CM

## 2019-05-14 MED ORDER — IOPAMIDOL (ISOVUE-M 200) INJECTION 41%
12.0000 mL | Freq: Once | INTRAMUSCULAR | Status: AC
  Administered 2019-05-14: 12 mL via INTRA_ARTICULAR

## 2019-05-20 ENCOUNTER — Ambulatory Visit (INDEPENDENT_AMBULATORY_CARE_PROVIDER_SITE_OTHER): Payer: 59 | Admitting: Orthopedic Surgery

## 2019-05-20 ENCOUNTER — Other Ambulatory Visit: Payer: Self-pay

## 2019-05-20 ENCOUNTER — Other Ambulatory Visit: Payer: Self-pay | Admitting: Orthopedic Surgery

## 2019-05-20 ENCOUNTER — Encounter: Payer: Self-pay | Admitting: Orthopedic Surgery

## 2019-05-20 DIAGNOSIS — M75121 Complete rotator cuff tear or rupture of right shoulder, not specified as traumatic: Secondary | ICD-10-CM

## 2019-05-20 NOTE — Progress Notes (Signed)
Office Visit Note   Patient: Darrell King           Date of Birth: 02/07/1966           MRN: [id number] Visit Date: 05/20/2019 Requested by: Burna CashSantos-Sanchez, Idalys, MD [address] [place] [id number] PCP: Burna CashSantos-Sanchez, Idalys, MD  Subjective: Chief Complaint  Patient presents with  . Follow-up    HPI: Darrell King is a patient with right shoulder pain.  He had previous rotator cuff surgery and distal clavicle excision done last year.  He did well for several months but then had recurrent pain.  Since have seen was had MRI arthrogram which is reviewed.  That study does show high-grade possible full-thickness recurrent tearing of the supraspinatus along with some recurrent spurring in the distal clavicle resection area.  He is having tenderness in this region as well as some weakness.  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  He has been taking ibuprofen without much relief.              ROS: All systems reviewed are negative as they relate to the chief complaint within the history of present illness.  Patient denies  fevers or chills.   Assessment & Plan: Visit Diagnoses:  1. Nontraumatic complete tear of right rotator cuff     Plan: Impression is high-grade partial-thickness recurrent tear right rotator cuff.  Reviewed the scan with the patient.  He is also having some tenderness around that [facility] resection site.  The space does look decompressed but there does appear to be some recurrent spurring formation present.  Plan is arthroscopy with revision AC joint resection and distal clavicle excision along with likely redo rotator cuff repair.  I think that in a nonphysical job he would be less symptomatic.  I will think conservative treatment options indicated at this time based on the pathology present.  The risk benefits of revision surgery discussed all questions answered.  Follow-Up Instructions: No follow-ups on file.   Orders:  No  orders of the defined types were placed in this encounter.  No orders of the defined types were placed in this encounter.     Procedures: No procedures performed   Clinical Data: No additional findings.  Objective: Vital Signs: There were no vitals taken for this visit.  Physical Exam:   Constitutional: Patient appears well-developed HEENT:  Head: Normocephalic Eyes:EOM are normal Neck: Normal range of motion Cardiovascular: Normal rate Pulmonary/chest: Effort normal Neurologic: Patient is alert Skin: Skin is warm Psychiatric: Patient has normal mood and affect    Ortho Exam: Ortho exam demonstrates full active and passive range of motion of that left side.  On the right-hand side he does have forward flexion abduction about 90 degrees but he does have some weakness with forward flexion on the right.  Incisions are well-healed and nontender with no erythema or warmth.  Does have tenderness to palpation along the distal clavicle excision site which was an open excision.  There is also some pain with crossarm adduction.  Specialty Comments:  No specialty comments available.  Imaging: No results found.   PMFS History: Patient Active Problem List   Diagnosis Date Noted  . Otitis externa 05/13/2019  . Syncope   . Chest pain 12/29/2018  . Other fatigue 11/11/2018  . Gout 12/31/2016  . Dyslipidemia 03/11/2016  . Essential hypertension, benign 02/23/2016  . 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 Numbness  and tingling of right upper extremity 11/29/2014  . Patient is Jehovah's Witness 01/04/2014  . Health care maintenance 11/24/2013  . Left inguinal hernia 11/11/2013   Past Medical History:  Diagnosis Date  . Acute gout 12/31/2016  . Anxiety    "had taken Paxil; back in 1996; nothing since" (10/21/2013)  . Arthritis   . Asthma    asa child  . Dyslipidemia 03/11/2016   12.5% 10-year risk of heart disease or stroke, should be on a moderate/high intensity  statin.    . Essential hypertension, benign 02/23/2016  . Refusal of blood transfusions as patient is Jehovah's Witness     Family History  Problem Relation Age of Onset  . Colon cancer Mother        colon  . Cancer Sister        dont know what type    Past Surgical History:  Procedure Laterality Date  . COLONOSCOPY W/ POLYPECTOMY    . INGUINAL HERNIA REPAIR Right 1995  . INGUINAL HERNIA REPAIR Left 11/12/2013   Procedure: HERNIA REPAIR INGUINAL INCARCERATED;  Surgeon: Odis Hollingshead, MD;  Location: [facility];  Service: General;  Laterality: Left;  . SHOULDER ARTHROSCOPY WITH SUBACROMIAL DECOMPRESSION, ROTATOR CUFF REPAIR AND BICEP TENDON REPAIR Right 07/14/2018   Procedure: RIGHT SHOULDER ARTHROSCOPY, DEBRIDEMENT, BICEPS TENODESIS, DISTAL CLAVICLE EXCISION AND MINI OPEN ROTATOR CUFF TEAR REPAIR;  Surgeon: Meredith Pel, MD;  Location: [facility];  Service: Orthopedics;  Laterality: Right;   Social History   Occupational History  . Occupation: Maintenance Tech  Tobacco Use  . Smoking status: Never Smoker  . Smokeless tobacco: Never Used  Substance and Sexual Activity  . Alcohol use: Yes    Alcohol/week: 1.0 - 2.0 standard drinks    Types: 1 - 2 Shots of liquor per week    Comment: occasionally.  . Drug use: No  . Sexual activity: Not on file

## 2019-05-20 NOTE — Telephone Encounter (Signed)
Ok to rf? 

## 2019-05-20 NOTE — Telephone Encounter (Signed)
Dean pt 

## 2019-05-21 MED ORDER — TRAMADOL HCL 50 MG PO TABS: 50.0000 mg | ORAL_TABLET | Freq: Two times a day (BID) | ORAL | 0 refills | Status: DC | PRN

## 2019-05-21 NOTE — Telephone Encounter (Signed)
Can you please submit electronically.  

## 2019-05-21 NOTE — Telephone Encounter (Signed)
y

## 2019-05-26 ENCOUNTER — Telehealth: Payer: Self-pay | Admitting: Orthopedic Surgery

## 2019-05-26 NOTE — Telephone Encounter (Signed)
Patient called and stated that he wants to know when is his surgery goning to be. He asked for Dr. Marlou Sa and when I asked what it was in regards to he replied "I [place]". I advised Dr. Marlou Sa has a scheduler for that. He disconnected the call.  Please call this patient to advise.  [phone]

## 2019-05-27 NOTE — Telephone Encounter (Signed)
[name] surgery has been scheduled at [facility] for Aug 3rd @ 10:30am. Patient is to arrive 2 hours prior to surgery.  Nothing to eat or drink after midnight the night before surgery.  Information was sent to patient's email pertaining to surgery (date, time, instructions, follow up appointment).  Several attempts have been made to return phone call, but the voicemail is full and not able to accept messages.

## 2019-05-28 NOTE — Telephone Encounter (Signed)
I was able to reach patient. He said that having his surgery at [facility] is not an option because they want $3500.00 prior to surgery. He is asking for it to be scheduled at a [facility] facility. I verified and number listed is the best number to reach patient.

## 2019-05-29 ENCOUNTER — Other Ambulatory Visit (HOSPITAL_COMMUNITY)
Admission: RE | Admit: 2019-05-29 | Discharge: 2019-05-29 | Disposition: A | Discharge status: Home / Self Care | Payer: 59 | Source: Ambulatory Visit | Attending: Orthopedic Surgery | Admitting: Orthopedic Surgery

## 2019-05-29 DIAGNOSIS — Z01812 Encounter for preprocedural laboratory examination: Secondary | ICD-10-CM | POA: Diagnosis present

## 2019-05-29 DIAGNOSIS — Z20828 Contact with and (suspected) exposure to other viral communicable diseases: Secondary | ICD-10-CM | POA: Diagnosis not present

## 2019-05-29 LAB — SARS CORONAVIRUS 2 (TAT 6-24 HRS): SARS Coronavirus 2: NEGATIVE

## 2019-06-01 ENCOUNTER — Other Ambulatory Visit: Payer: Self-pay

## 2019-06-01 ENCOUNTER — Encounter (HOSPITAL_COMMUNITY): Payer: Self-pay | Admitting: *Deleted

## 2019-06-01 ENCOUNTER — Telehealth: Payer: Self-pay | Admitting: Orthopedic Surgery

## 2019-06-01 ENCOUNTER — Telehealth: Payer: Self-pay

## 2019-06-01 MED ORDER — DEXTROSE 5 % IV SOLN
3.0000 g | INTRAVENOUS | Status: AC
  Administered 2019-06-02: 3 g via INTRAVENOUS
  Filled 2019-06-01: qty 3

## 2019-06-01 NOTE — Progress Notes (Signed)
Spoke with pt for pre-op call. Pt has hx of HTN, but stopped all his meds about 3-4 months ago because "my blood pressure is fine, I don't need them anymore". I asked him if his PCP (Internal Medicine Clinic) was aware and he stated yes.   Pt will have his wife come by the hospital to pick up the Presurgery Ensure drink. Instructed pt not to eat food after midnight tonight, that he may have clear liquids until 12:30 PM tomorrow (3 hours prior to surgery) and to drink the Pre-surgery Ensure between 12:15 and 12:30 PM tomorrow. Pt voiced understanding.   Pt had Covid 19 test done on 05/29/19 and it is negative. He states he is following quarantine instructions.   Coronavirus Screening  Have you experienced the following symptoms:  Cough NO Fever (>100.58F)  NO Runny nose NO Sore throat NO Difficulty breathing/shortness of breath  NO  Have you or a family member traveled in the last 14 days and where? NO   Patient reminded that hospital visitation restrictions are in effect and the importance of the restrictions. Informed pt that he may have 1 visitor come with him to wait in the waiting room while he is in pre-op and in surgery. Pt voiced understanding.

## 2019-06-01 NOTE — Telephone Encounter (Signed)
FYI

## 2019-06-01 NOTE — Telephone Encounter (Signed)
FYIEbony Hail in Short Stay at [facility] calling regarding this patient scheduled for right shoulder arthroscopy, mini open rotator cuff repain tomorrow (around 3:30pm).  Patient is not taking his blood pressure medicine anymore.  Patient states his PCP is aware, but there is no mention in the chart. Ebony Hail says she can't predict what it will be tomorrow, but if it is much higher than his last reading of 143/104, it could be cancelled. Just making you aware.  Staff message sent to you from [place].  If you have any questions, feel free to call  [phone].

## 2019-06-01 NOTE — Telephone Encounter (Signed)
Received form from patient today for disability Tried calling him to discuss No answer VM full, unable to LM Need to discuss this form and whether it is for initial date of treatment with Dr Marlou Sa or for his surgery that is scheduled for tomorrow.

## 2019-06-01 NOTE — Progress Notes (Addendum)
Anesthesia Chart Review: Kathleene Hazel   Case: [id number] Date/Time: 06/02/19 1510   Procedure: right shoulder arthroscopy , mini open rotator cuff repair (Right )   Anesthesia type: General   Pre-op diagnosis: right shoulder rotator cuff tear, acromioclavicular joint spurring   Location: [facility] OR ROOM 06 / [facility]   Surgeon: Meredith Pel, MD      DISCUSSION: Patient is a 53 year old male scheduled for the above procedure.  History includes never smoker, dyslipidemia, HTN, childhood asthma. He is a Restaurant manager, fast food. Reports he was slow to wake up from 07/14/18 right shoulder arthroscopy.    - Admitted 12/29/18-01/01/19 for atypical chest pain. 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 BP was 170/103, he had N/V followed by syncope and EMS was called (in setting of known history of recurrent vasovagal syncope). Echo unremarkable and coronary CT showed calcium score of 0 with myocardial bridging mLAD but no CAD. Telemetry did not show any arrhythmias, although beta blockers avoided due to bradycardia in the 50's. He was discharged on amlodipine in place of losartan (which he is no longer taking).   According to PAT RN phone interview, he reported that he stopped his BP medications 3-4 months ago, because "my blood pressure is fine, I don't need them anymore." Patient reported his PCP was aware, but I can't confirm either way.  BP was 143/104 at 05/13/19 visit for otitis externa at [facility].  BP Readings from Last 3 Encounters:  05/13/19 (!) 143/104  01/01/19 131/89  11/11/18 136/85  - In review of pre-operative vitals 07/14/18, SBP 150-185/107-115 with post-operative BP 141-151/81-96. (I believe he was on losartan at that time, but would not have taken it on the morning of surgery.)    Message left with Debbie at Dr. Randel Pigg office regarding untreated hypertension and will also send Dr. Marlou Sa a staff message. He will get vitals and labs  on arrival if DBP > 110, it is possible case could be delayed or cancelled.  Negative presurgical COVID-19 test on 05/29/2019.    PROVIDERS: Welford Roche, MD is listed as PCP Cardiologist Glenetta Hew, MD was consulted during his 12/2018 admission.    LABS: He is for updated labs on arrival.   IMAGES: MRI right shoulder 05/14/19: IMPRESSION: 1. Prior rotator cuff repair. Large high-grade partial-thickness articular surface tear of the supraspinatus tendon. 2. Mild tendinosis of the infraspinatus tendon. 3. Intermediate signal material within the axillary recess likely reflecting synovitis.  1V PCXR 12/29/18: IMPRESSION: Low lung volumes with mild vascular congestion and bibasilar atelectasis.    EKG: 12/31/18: Sinus bradycardia at 55 bpm Possible Anterior infarct , age undetermined Abnormal ECG since last tracing no significant change Confirmed by Larae Grooms [phone]) on 12/31/2018 5:34:27 PM   CV: CT coronary 12/31/18: IMPRESSION: 1. Coronary calcium score of 0. This was 0 percentile for age and sex matched control. 2. Normal coronary origin with right dominance. 3. No evidence of CAD. 4. 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   Echo 12/30/18: IMPRESSIONS  1. The left ventricle has normal systolic function with an ejection fraction of 60-65%. The cavity size was normal. There is mild concentric left ventricular hypertrophy. Left ventricular diastolic parameters were normal.  2. The right ventricle has normal systolic function. The cavity was normal. There is no increase in right ventricular wall thickness. Right ventricular systolic pressure could not be assessed.  3. The mitral valve is normal in structure.  4. The tricuspid valve is normal in structure.  5. The aortic valve is tricuspid.  6. The pulmonic valve was normal in structure.  7. The inferior vena cava was normal in size with <50% respiratory  variability.   Past Medical History:  Diagnosis Date  . Acute gout 12/31/2016  . Anxiety    "had taken Paxil; back in 1996; nothing since" (10/21/2013)  . Arthritis   . Asthma    asa child  . Complication of anesthesia    SLOW TO WAKE UP AFTER SURGERY IN 2019  . Dyslipidemia 03/11/2016   12.5% 10-year risk of heart disease or stroke, should be on a moderate/high intensity statin.    . Essential hypertension, benign 02/23/2016  . Refusal of blood transfusions as patient is Jehovah's Witness     Past Surgical History:  Procedure Laterality Date  . COLONOSCOPY W/ POLYPECTOMY    . INGUINAL HERNIA REPAIR Right 1995  . INGUINAL HERNIA REPAIR Left 11/12/2013   Procedure: HERNIA REPAIR INGUINAL INCARCERATED;  Surgeon: Adolph Pollackodd J Rosenbower, MD;  [facility];  Service: General;  Laterality: Left;  . SHOULDER ARTHROSCOPY WITH SUBACROMIAL DECOMPRESSION, ROTATOR CUFF REPAIR AND BICEP TENDON REPAIR Right 07/14/2018   Procedure: RIGHT SHOULDER ARTHROSCOPY, DEBRIDEMENT, BICEPS TENODESIS, DISTAL CLAVICLE EXCISION AND MINI OPEN ROTATOR CUFF TEAR REPAIR;  Surgeon: Cammy Copaean, Gregory Scott, MD;  Location: [facility] OR;  Service: Orthopedics;  Laterality: Right;    MEDICATIONS: No current facility-administered medications for this encounter.    . traMADol (ULTRAM) 50 MG tablet  . amLODipine (NORVASC) 5 MG tablet  . aspirin EC 81 MG EC tablet  . atorvastatin (LIPITOR) 40 MG tablet  . ciprofloxacin-dexamethasone (CIPRODEX) OTIC suspension   - He is not currently taking any medications.   Shonna ChockAllison Vasily Fedewa, PA-C Surgical Short Stay/[facility]esthesiology [facility] Phone [phone] [facility] Phone [phone] 06/01/2019 12:05 PM

## 2019-06-01 NOTE — Anesthesia Preprocedure Evaluation (Addendum)
Anesthesia Evaluation  Patient identified by MRN, date of birth, ID band Patient awake    Reviewed: Allergy & Precautions, NPO status , [name] Chart, lab work & pertinent test results  Airway Mallampati: III  TM Distance: >3 FB Neck ROM: Full    Dental  (+) Missing   Pulmonary asthma ,    Pulmonary exam normal breath sounds clear to auscultation       Cardiovascular hypertension, Pt. on medications Normal cardiovascular exam Rhythm:Regular Rate:Normal  ECG: SB, rate 55  ECHO: 1. The left ventricle has normal systolic function with an ejection fraction of 60-65%. The cavity size was normal. There is mild concentric left ventricular hypertrophy. Left ventricular diastolic parameters were normal. 2. The right ventricle has normal systolic function. The cavity was normal. There is no increase in right ventricular wall thickness. Right ventricular systolic pressure could not be assessed. 3. The mitral valve is normal in structure. 4. The tricuspid valve is normal in structure. 5. The aortic valve is tricuspid. 6. The pulmonic valve was normal in structure. 7. The inferior vena cava was normal in size with <50% respiratory variability.   Neuro/Psych Anxiety Numbness and tingling of right upper extremity    GI/Hepatic negative GI ROS, Neg liver ROS,   Endo/Other  negative endocrine ROS  Renal/GU negative Renal ROS     Musculoskeletal Gout   Abdominal (+) + obese,   Peds  Hematology  (+) JEHOVAH'S WITNESSHLD   Anesthesia Other Findings right shoulder rotator cuff tear, acromioclavicular joint spurring  Reproductive/Obstetrics                           Anesthesia Physical Anesthesia Plan  ASA: III  Anesthesia Plan: General and Regional   Post-op Pain Management: GA combined w/ Regional for post-op pain   Induction: Intravenous  PONV Risk Score and Plan: 2 and Ondansetron, Dexamethasone,  Midazolam and Treatment may vary due to age or medical condition  Airway Management Planned: Oral ETT  Additional Equipment:   Intra-op Plan:   Post-operative Plan: Extubation in OR  Informed Consent: I have reviewed the patients History and Physical, chart, labs and discussed the procedure including the risks, benefits and alternatives for the proposed anesthesia with the patient or authorized representative who has indicated his/her understanding and acceptance.     Dental advisory given  Plan Discussed with: CRNA  Anesthesia Plan Comments: (Reviewed PAT note written 06/01/2019 by Myra Gianotti, PA-C. )      Anesthesia Quick Evaluation

## 2019-06-01 NOTE — Progress Notes (Signed)
Pt's wife arrived and Presurgery Ensure drink taken out to her.

## 2019-06-01 NOTE — Telephone Encounter (Signed)
ok 

## 2019-06-02 ENCOUNTER — Ambulatory Visit (HOSPITAL_COMMUNITY): Payer: 59 | Admitting: Vascular Surgery

## 2019-06-02 ENCOUNTER — Other Ambulatory Visit: Payer: Self-pay

## 2019-06-02 ENCOUNTER — Observation Stay (HOSPITAL_COMMUNITY)
Admission: RE | Admit: 2019-06-02 | Discharge: 2019-06-03 | DRG: 500 | Disposition: A | Discharge status: Home / Self Care | Payer: 59 | Attending: Orthopedic Surgery | Admitting: Orthopedic Surgery

## 2019-06-02 ENCOUNTER — Ambulatory Visit (HOSPITAL_COMMUNITY): Payer: 59

## 2019-06-02 ENCOUNTER — Encounter (HOSPITAL_COMMUNITY)
Admission: RE | Disposition: A | Discharge status: Home / Self Care | Payer: Self-pay | Source: Home / Self Care | Attending: Orthopedic Surgery

## 2019-06-02 ENCOUNTER — Encounter (HOSPITAL_COMMUNITY): Payer: Self-pay

## 2019-06-02 DIAGNOSIS — G9341 Metabolic encephalopathy: Secondary | ICD-10-CM | POA: Diagnosis not present

## 2019-06-02 DIAGNOSIS — I517 Cardiomegaly: Secondary | ICD-10-CM | POA: Insufficient documentation

## 2019-06-02 DIAGNOSIS — M19011 Primary osteoarthritis, right shoulder: Secondary | ICD-10-CM | POA: Diagnosis not present

## 2019-06-02 DIAGNOSIS — Z800 Family history of malignant neoplasm of digestive organs: Secondary | ICD-10-CM | POA: Insufficient documentation

## 2019-06-02 DIAGNOSIS — R55 Syncope and collapse: Secondary | ICD-10-CM

## 2019-06-02 DIAGNOSIS — Z809 Family history of malignant neoplasm, unspecified: Secondary | ICD-10-CM | POA: Diagnosis not present

## 2019-06-02 DIAGNOSIS — E669 Obesity, unspecified: Secondary | ICD-10-CM | POA: Diagnosis not present

## 2019-06-02 DIAGNOSIS — Z86010 Personal history of colonic polyps: Secondary | ICD-10-CM | POA: Diagnosis not present

## 2019-06-02 DIAGNOSIS — M109 Gout, unspecified: Secondary | ICD-10-CM | POA: Diagnosis not present

## 2019-06-02 DIAGNOSIS — Z9889 Other specified postprocedural states: Secondary | ICD-10-CM | POA: Diagnosis present

## 2019-06-02 DIAGNOSIS — Z79899 Other long term (current) drug therapy: Secondary | ICD-10-CM | POA: Diagnosis not present

## 2019-06-02 DIAGNOSIS — R410 Disorientation, unspecified: Secondary | ICD-10-CM | POA: Insufficient documentation

## 2019-06-02 DIAGNOSIS — M75111 Incomplete rotator cuff tear or rupture of right shoulder, not specified as traumatic: Principal | ICD-10-CM | POA: Insufficient documentation

## 2019-06-02 DIAGNOSIS — I10 Essential (primary) hypertension: Secondary | ICD-10-CM | POA: Diagnosis not present

## 2019-06-02 DIAGNOSIS — Z6831 Body mass index (BMI) 31.0-31.9, adult: Secondary | ICD-10-CM | POA: Diagnosis not present

## 2019-06-02 DIAGNOSIS — M75100 Unspecified rotator cuff tear or rupture of unspecified shoulder, not specified as traumatic: Secondary | ICD-10-CM | POA: Diagnosis present

## 2019-06-02 DIAGNOSIS — E785 Hyperlipidemia, unspecified: Secondary | ICD-10-CM | POA: Diagnosis not present

## 2019-06-02 DIAGNOSIS — R51 Headache: Secondary | ICD-10-CM | POA: Insufficient documentation

## 2019-06-02 DIAGNOSIS — R531 Weakness: Secondary | ICD-10-CM | POA: Insufficient documentation

## 2019-06-02 DIAGNOSIS — Z7982 Long term (current) use of aspirin: Secondary | ICD-10-CM | POA: Diagnosis not present

## 2019-06-02 DIAGNOSIS — R5383 Other fatigue: Secondary | ICD-10-CM | POA: Insufficient documentation

## 2019-06-02 DIAGNOSIS — M25711 Osteophyte, right shoulder: Secondary | ICD-10-CM | POA: Insufficient documentation

## 2019-06-02 DIAGNOSIS — F419 Anxiety disorder, unspecified: Secondary | ICD-10-CM | POA: Insufficient documentation

## 2019-06-02 HISTORY — DX: Other complications of anesthesia, initial encounter: T88.59XA

## 2019-06-02 HISTORY — PX: SHOULDER ARTHROSCOPY WITH OPEN ROTATOR CUFF REPAIR AND DISTAL CLAVICLE ACROMINECTOMY: SHX5683

## 2019-06-02 LAB — CBC
HCT: 44.5 % (ref 39.0–52.0)
Hemoglobin: 14.9 g/dL (ref 13.0–17.0)
MCH: 28.5 pg (ref 26.0–34.0)
MCHC: 33.5 g/dL (ref 30.0–36.0)
MCV: 85.2 fL (ref 80.0–100.0)
Platelets: 213 10*3/uL (ref 150–400)
RBC: 5.22 MIL/uL (ref 4.22–5.81)
RDW: 13.4 % (ref 11.5–15.5)
WBC: 4.9 10*3/uL (ref 4.0–10.5)
nRBC: 0 % (ref 0.0–0.2)

## 2019-06-02 LAB — BASIC METABOLIC PANEL
Anion gap: 10 (ref 5–15)
BUN: 12 mg/dL (ref 6–20)
CO2: 23 mmol/L (ref 22–32)
Calcium: 9 mg/dL (ref 8.9–10.3)
Chloride: 105 mmol/L (ref 98–111)
Creatinine, Ser: 1.08 mg/dL (ref 0.61–1.24)
GFR calc Af Amer: >60 mL/min (ref >60)
GFR calc non Af Amer: >60 mL/min (ref >60)
Glucose, Bld: 137 mg/dL — ABNORMAL HIGH (ref 70–99)
Potassium: 3.2 mmol/L — ABNORMAL LOW (ref 3.5–5.1)
Sodium: 138 mmol/L (ref 135–145)

## 2019-06-02 LAB — GLUCOSE, CAPILLARY: Glucose-Capillary: 107 mg/dL — ABNORMAL HIGH (ref 70–99)

## 2019-06-02 SURGERY — SHOULDER ARTHROSCOPY WITH OPEN ROTATOR CUFF REPAIR AND DISTAL CLAVICLE ACROMINECTOMY
Anesthesia: Regional | Laterality: Right

## 2019-06-02 MED ORDER — HYDROMORPHONE HCL 1 MG/ML IJ SOLN
INTRAMUSCULAR | Status: AC
  Filled 2019-06-02: qty 1

## 2019-06-02 MED ORDER — HYDROCODONE-ACETAMINOPHEN 5-325 MG PO TABS
1.0000 | ORAL_TABLET | ORAL | Status: DC | PRN
  Administered 2019-06-02: 23:00:00 2 via ORAL
  Administered 2019-06-03: 1 via ORAL
  Administered 2019-06-03: 2 via ORAL
  Administered 2019-06-03: 1 via ORAL
  Filled 2019-06-02 (×2): qty 1
  Filled 2019-06-02 (×2): qty 2

## 2019-06-02 MED ORDER — GLYCOPYRROLATE 0.2 MG/ML IJ SOLN
INTRAMUSCULAR | Status: AC
  Filled 2019-06-02: qty 1

## 2019-06-02 MED ORDER — FENTANYL CITRATE (PF) 250 MCG/5ML IJ SOLN
INTRAMUSCULAR | Status: DC | PRN
  Administered 2019-06-02: 50 ug via INTRAVENOUS

## 2019-06-02 MED ORDER — OXYCODONE HCL 5 MG/5ML PO SOLN: 5.0000 mg | Freq: Once | ORAL | Status: DC | PRN

## 2019-06-02 MED ORDER — ONDANSETRON HCL 4 MG/2ML IJ SOLN
INTRAMUSCULAR | Status: AC
  Filled 2019-06-02: qty 2

## 2019-06-02 MED ORDER — SUCCINYLCHOLINE CHLORIDE 200 MG/10ML IV SOSY
PREFILLED_SYRINGE | INTRAVENOUS | Status: AC
  Filled 2019-06-02: qty 10

## 2019-06-02 MED ORDER — ASPIRIN 81 MG PO TBEC: 81.0000 mg | DELAYED_RELEASE_TABLET | Freq: Every day | ORAL | 0 refills | Status: DC

## 2019-06-02 MED ORDER — GLYCOPYRROLATE 0.2 MG/ML IJ SOLN
0.2000 mg | Freq: Once | INTRAMUSCULAR | Status: AC
  Administered 2019-06-02: 0.2 mg via INTRAVENOUS

## 2019-06-02 MED ORDER — EPINEPHRINE PF 1 MG/ML IJ SOLN
INTRAMUSCULAR | Status: AC
  Filled 2019-06-02: qty 4

## 2019-06-02 MED ORDER — SODIUM CHLORIDE 0.9 % IV SOLN
INTRAVENOUS | Status: DC | PRN
  Administered 2019-06-02: 25 ug/min via INTRAVENOUS

## 2019-06-02 MED ORDER — DOCUSATE SODIUM 100 MG PO CAPS
100.0000 mg | ORAL_CAPSULE | Freq: Two times a day (BID) | ORAL | Status: DC
  Administered 2019-06-02 – 2019-06-03 (×2): 100 mg via ORAL
  Filled 2019-06-02 (×2): qty 1

## 2019-06-02 MED ORDER — ONDANSETRON HCL 4 MG/2ML IJ SOLN
INTRAMUSCULAR | Status: DC | PRN
  Administered 2019-06-02: 4 mg via INTRAVENOUS

## 2019-06-02 MED ORDER — 0.9 % SODIUM CHLORIDE (POUR BTL) OPTIME
TOPICAL | Status: DC | PRN
  Administered 2019-06-02 (×2): 1000 mL

## 2019-06-02 MED ORDER — PROMETHAZINE HCL 25 MG/ML IJ SOLN: 6.2500 mg | INTRAMUSCULAR | Status: DC | PRN

## 2019-06-02 MED ORDER — ACETAMINOPHEN 500 MG PO TABS
ORAL_TABLET | ORAL | Status: AC
  Administered 2019-06-02: 1000 mg via ORAL
  Filled 2019-06-02: qty 2

## 2019-06-02 MED ORDER — GABAPENTIN 300 MG PO CAPS
300.0000 mg | ORAL_CAPSULE | Freq: Three times a day (TID) | ORAL | Status: DC
  Administered 2019-06-02 – 2019-06-03 (×3): 300 mg via ORAL
  Filled 2019-06-02 (×3): qty 1

## 2019-06-02 MED ORDER — LIDOCAINE 2% (20 MG/ML) 5 ML SYRINGE
INTRAMUSCULAR | Status: AC
  Filled 2019-06-02: qty 5

## 2019-06-02 MED ORDER — GLYCOPYRROLATE PF 0.2 MG/ML IJ SOSY
PREFILLED_SYRINGE | INTRAMUSCULAR | Status: DC | PRN
  Administered 2019-06-02: .2 mg via INTRAVENOUS

## 2019-06-02 MED ORDER — ONDANSETRON HCL 4 MG/2ML IJ SOLN: 4.0000 mg | Freq: Four times a day (QID) | INTRAMUSCULAR | Status: DC | PRN

## 2019-06-02 MED ORDER — CEFAZOLIN SODIUM-DEXTROSE 2-4 GM/100ML-% IV SOLN
2.0000 g | Freq: Four times a day (QID) | INTRAVENOUS | Status: AC
  Administered 2019-06-02 – 2019-06-03 (×2): 2 g via INTRAVENOUS
  Filled 2019-06-02 (×2): qty 100

## 2019-06-02 MED ORDER — CHLORHEXIDINE GLUCONATE 4 % EX LIQD: 60.0000 mL | Freq: Once | CUTANEOUS | Status: DC

## 2019-06-02 MED ORDER — MENTHOL 3 MG MT LOZG: 1.0000 | LOZENGE | OROMUCOSAL | Status: DC | PRN

## 2019-06-02 MED ORDER — SODIUM CHLORIDE 0.9 % IR SOLN
Status: DC | PRN
  Administered 2019-06-02 (×2): 3000 mL

## 2019-06-02 MED ORDER — LIDOCAINE 2% (20 MG/ML) 5 ML SYRINGE
INTRAMUSCULAR | Status: DC | PRN
  Administered 2019-06-02: 100 mg via INTRAVENOUS

## 2019-06-02 MED ORDER — MIDAZOLAM HCL 2 MG/2ML IJ SOLN
INTRAMUSCULAR | Status: AC
  Administered 2019-06-02: 2 mg via INTRAVENOUS
  Filled 2019-06-02: qty 2

## 2019-06-02 MED ORDER — ROCURONIUM BROMIDE 10 MG/ML (PF) SYRINGE
PREFILLED_SYRINGE | INTRAVENOUS | Status: AC
  Filled 2019-06-02: qty 10

## 2019-06-02 MED ORDER — LACTATED RINGERS IV BOLUS
1000.0000 mL | Freq: Once | INTRAVENOUS | Status: AC
  Administered 2019-06-02: 1000 mL via INTRAVENOUS

## 2019-06-02 MED ORDER — ROCURONIUM BROMIDE 10 MG/ML (PF) SYRINGE
PREFILLED_SYRINGE | INTRAVENOUS | Status: DC | PRN
  Administered 2019-06-02: 50 mg via INTRAVENOUS

## 2019-06-02 MED ORDER — TRAMADOL HCL 50 MG PO TABS
50.0000 mg | ORAL_TABLET | Freq: Two times a day (BID) | ORAL | Status: DC | PRN
  Administered 2019-06-03: 50 mg via ORAL
  Filled 2019-06-02: qty 1

## 2019-06-02 MED ORDER — FENTANYL CITRATE (PF) 100 MCG/2ML IJ SOLN
100.0000 ug | Freq: Once | INTRAMUSCULAR | Status: AC
  Administered 2019-06-02: 100 ug via INTRAVENOUS

## 2019-06-02 MED ORDER — ONDANSETRON HCL 4 MG PO TABS: 4.0000 mg | ORAL_TABLET | Freq: Four times a day (QID) | ORAL | Status: DC | PRN

## 2019-06-02 MED ORDER — METOCLOPRAMIDE HCL 5 MG PO TABS: 5.0000 mg | ORAL_TABLET | Freq: Three times a day (TID) | ORAL | Status: DC | PRN

## 2019-06-02 MED ORDER — PHENYLEPHRINE 40 MCG/ML (10ML) SYRINGE FOR IV PUSH (FOR BLOOD PRESSURE SUPPORT)
PREFILLED_SYRINGE | INTRAVENOUS | Status: AC
  Filled 2019-06-02: qty 10

## 2019-06-02 MED ORDER — KETOROLAC TROMETHAMINE 30 MG/ML IJ SOLN: 30.0000 mg | Freq: Once | INTRAMUSCULAR | Status: DC | PRN

## 2019-06-02 MED ORDER — BUPIVACAINE HCL (PF) 0.25 % IJ SOLN
INTRAMUSCULAR | Status: AC
  Filled 2019-06-02: qty 30

## 2019-06-02 MED ORDER — BUPIVACAINE HCL (PF) 0.5 % IJ SOLN
INTRAMUSCULAR | Status: DC | PRN
  Administered 2019-06-02: 15 mL via PERINEURAL

## 2019-06-02 MED ORDER — OXYCODONE HCL 5 MG PO TABS: 5.0000 mg | ORAL_TABLET | Freq: Four times a day (QID) | ORAL | 0 refills | Status: DC | PRN

## 2019-06-02 MED ORDER — MORPHINE SULFATE (PF) 2 MG/ML IV SOLN: 0.5000 mg | INTRAVENOUS | Status: DC | PRN

## 2019-06-02 MED ORDER — BUPIVACAINE LIPOSOME 1.3 % IJ SUSP
INTRAMUSCULAR | Status: DC | PRN
  Administered 2019-06-02: 10 mL via PERINEURAL

## 2019-06-02 MED ORDER — FENTANYL CITRATE (PF) 250 MCG/5ML IJ SOLN
INTRAMUSCULAR | Status: AC
  Filled 2019-06-02: qty 5

## 2019-06-02 MED ORDER — DEXAMETHASONE SODIUM PHOSPHATE 10 MG/ML IJ SOLN
INTRAMUSCULAR | Status: DC | PRN
  Administered 2019-06-02: 10 mg via INTRAVENOUS

## 2019-06-02 MED ORDER — MIDAZOLAM HCL 2 MG/2ML IJ SOLN
2.0000 mg | Freq: Once | INTRAMUSCULAR | Status: AC
  Administered 2019-06-02: 2 mg via INTRAVENOUS

## 2019-06-02 MED ORDER — HYDROMORPHONE HCL 1 MG/ML IJ SOLN
0.2500 mg | INTRAMUSCULAR | Status: DC | PRN
  Administered 2019-06-02: 0.5 mg via INTRAVENOUS

## 2019-06-02 MED ORDER — ACETAMINOPHEN 500 MG PO TABS
1000.0000 mg | ORAL_TABLET | Freq: Once | ORAL | Status: AC
  Administered 2019-06-02: 1000 mg via ORAL

## 2019-06-02 MED ORDER — ACETAMINOPHEN 325 MG PO TABS: 325.0000 mg | ORAL_TABLET | Freq: Four times a day (QID) | ORAL | Status: DC | PRN

## 2019-06-02 MED ORDER — GLYCOPYRROLATE 0.2 MG/ML IJ SOLN: 0.1000 mg | Freq: Once | INTRAMUSCULAR | Status: DC

## 2019-06-02 MED ORDER — ASPIRIN 81 MG PO CHEW: 81.0000 mg | CHEWABLE_TABLET | Freq: Once | ORAL | Status: DC

## 2019-06-02 MED ORDER — EPHEDRINE SULFATE 50 MG/ML IJ SOLN
INTRAMUSCULAR | Status: DC | PRN
  Administered 2019-06-02 (×2): 10 mg via INTRAVENOUS
  Administered 2019-06-02: 5 mg via INTRAVENOUS
  Administered 2019-06-02: 10 mg via INTRAVENOUS

## 2019-06-02 MED ORDER — ARTIFICIAL TEARS OPHTHALMIC OINT
TOPICAL_OINTMENT | OPHTHALMIC | Status: DC | PRN
  Administered 2019-06-02: 1 via OPHTHALMIC

## 2019-06-02 MED ORDER — SUGAMMADEX SODIUM 200 MG/2ML IV SOLN
INTRAVENOUS | Status: DC | PRN
  Administered 2019-06-02: 300 mg via INTRAVENOUS

## 2019-06-02 MED ORDER — METOCLOPRAMIDE HCL 5 MG/ML IJ SOLN: 5.0000 mg | Freq: Three times a day (TID) | INTRAMUSCULAR | Status: DC | PRN

## 2019-06-02 MED ORDER — DEXAMETHASONE SODIUM PHOSPHATE 10 MG/ML IJ SOLN
INTRAMUSCULAR | Status: AC
  Filled 2019-06-02: qty 1

## 2019-06-02 MED ORDER — OXYCODONE HCL 5 MG PO TABS: 5.0000 mg | ORAL_TABLET | Freq: Once | ORAL | Status: DC | PRN

## 2019-06-02 MED ORDER — METHOCARBAMOL 500 MG PO TABS: 500.0000 mg | ORAL_TABLET | Freq: Three times a day (TID) | ORAL | 0 refills | Status: DC | PRN

## 2019-06-02 MED ORDER — IOHEXOL 350 MG/ML SOLN
75.0000 mL | Freq: Once | INTRAVENOUS | Status: AC | PRN
  Administered 2019-06-02: 75 mL via INTRAVENOUS

## 2019-06-02 MED ORDER — SUCCINYLCHOLINE CHLORIDE 20 MG/ML IJ SOLN
INTRAMUSCULAR | Status: DC | PRN
  Administered 2019-06-02: 140 mg via INTRAVENOUS

## 2019-06-02 MED ORDER — PHENOL 1.4 % MT LIQD: 1.0000 | OROMUCOSAL | Status: DC | PRN

## 2019-06-02 MED ORDER — PROPOFOL 10 MG/ML IV BOLUS
INTRAVENOUS | Status: DC | PRN
  Administered 2019-06-02: 200 mg via INTRAVENOUS

## 2019-06-02 MED ORDER — ARTIFICIAL TEARS OPHTHALMIC OINT
TOPICAL_OINTMENT | OPHTHALMIC | Status: AC
  Filled 2019-06-02: qty 3.5

## 2019-06-02 MED ORDER — SODIUM CHLORIDE 0.9 % IV SOLN
INTRAVENOUS | Status: DC
  Administered 2019-06-02 – 2019-06-03 (×2): via INTRAVENOUS

## 2019-06-02 MED ORDER — PROPOFOL 10 MG/ML IV BOLUS
INTRAVENOUS | Status: AC
  Filled 2019-06-02: qty 20

## 2019-06-02 MED ORDER — LACTATED RINGERS IV SOLN
INTRAVENOUS | Status: DC
  Administered 2019-06-02 (×2): via INTRAVENOUS

## 2019-06-02 MED ORDER — FENTANYL CITRATE (PF) 100 MCG/2ML IJ SOLN
INTRAMUSCULAR | Status: AC
  Administered 2019-06-02: 100 ug via INTRAVENOUS
  Filled 2019-06-02: qty 2

## 2019-06-02 SURGICAL SUPPLY — 68 items
ANCHOR SUT BIOCOMP CORKSREW (Anchor) ×6 IMPLANT
BLADE AVERAGE 25MMX9MM (BLADE) ×1
BLADE AVERAGE 25X9 (BLADE) ×2 IMPLANT
BLADE CUTTER GATOR 3.5 (BLADE) IMPLANT
BLADE EXCALIBUR 4.0MM X 13CM (MISCELLANEOUS) ×1
BLADE EXCALIBUR 4.0X13 (MISCELLANEOUS) ×2 IMPLANT
BLADE GREAT WHITE 4.2 (BLADE) IMPLANT
BLADE GREAT WHITE 4.2MM (BLADE)
BLADE SURG 11 STRL SS (BLADE) ×3 IMPLANT
BUR OVAL 6.0 (BURR) IMPLANT
CLOSURE WOUND 1/2 X4 (GAUZE/BANDAGES/DRESSINGS) ×1
COVER SURGICAL LIGHT HANDLE (MISCELLANEOUS) ×3 IMPLANT
COVER WAND RF STERILE (DRAPES) ×3 IMPLANT
DRAPE INCISE IOBAN 66X45 STRL (DRAPES) ×6 IMPLANT
DRAPE STERI 35X30 U-POUCH (DRAPES) ×3 IMPLANT
DRAPE U-SHAPE 47X51 STRL (DRAPES) ×6 IMPLANT
DRSG AQUACEL AG ADV 3.5X 6 (GAUZE/BANDAGES/DRESSINGS) ×3 IMPLANT
DRSG PAD ABDOMINAL 8X10 ST (GAUZE/BANDAGES/DRESSINGS) ×9 IMPLANT
DRSG TEGADERM 4X4.75 (GAUZE/BANDAGES/DRESSINGS) ×9 IMPLANT
DURAPREP 26ML APPLICATOR (WOUND CARE) ×3 IMPLANT
ELECT REM PT RETURN 9FT ADLT (ELECTROSURGICAL) ×3
ELECTRODE REM PT RTRN 9FT ADLT (ELECTROSURGICAL) ×1 IMPLANT
FILTER STRAW FLUID ASPIR (MISCELLANEOUS) ×3 IMPLANT
GAUZE SPONGE 4X4 12PLY STRL LF (GAUZE/BANDAGES/DRESSINGS) ×3 IMPLANT
GAUZE XEROFORM 1X8 LF (GAUZE/BANDAGES/DRESSINGS) ×3 IMPLANT
GLOVE BIOGEL PI IND STRL 8 (GLOVE) ×1 IMPLANT
GLOVE BIOGEL PI INDICATOR 8 (GLOVE) ×2
GLOVE SURG ORTHO 8.0 STRL STRW (GLOVE) ×3 IMPLANT
GOWN STRL REUS W/ TWL LRG LVL3 (GOWN DISPOSABLE) ×3 IMPLANT
GOWN STRL REUS W/TWL LRG LVL3 (GOWN DISPOSABLE) ×6
KIT BASIN OR (CUSTOM PROCEDURE TRAY) ×3 IMPLANT
KIT TURNOVER KIT B (KITS) ×3 IMPLANT
MANIFOLD NEPTUNE II (INSTRUMENTS) ×3 IMPLANT
NDL SUT 6 .5 CRC .975X.05 MAYO (NEEDLE) IMPLANT
NEEDLE HYPO 25X1 1.5 SAFETY (NEEDLE) ×3 IMPLANT
NEEDLE MAYO TAPER (NEEDLE)
NEEDLE SCORPION MULTI FIRE (NEEDLE) ×3 IMPLANT
NEEDLE SPNL 18GX3.5 QUINCKE PK (NEEDLE) ×3 IMPLANT
NS IRRIG 1000ML POUR BTL (IV SOLUTION) ×3 IMPLANT
PACK SHOULDER (CUSTOM PROCEDURE TRAY) ×3 IMPLANT
PAD ARMBOARD 7.5X6 YLW CONV (MISCELLANEOUS) ×6 IMPLANT
PUSHLOCK PEEK 4.5X24 (Orthopedic Implant) ×6 IMPLANT
RESTRAINT HEAD UNIVERSAL NS (MISCELLANEOUS) ×3 IMPLANT
SLING ARM IMMOBILIZER MED (SOFTGOODS) IMPLANT
SPONGE LAP 4X18 RFD (DISPOSABLE) ×6 IMPLANT
STRIP CLOSURE SKIN 1/2X4 (GAUZE/BANDAGES/DRESSINGS) ×2 IMPLANT
SUCTION FRAZIER HANDLE 10FR (MISCELLANEOUS) ×2
SUCTION TUBE FRAZIER 10FR DISP (MISCELLANEOUS) ×1 IMPLANT
SUT ETHILON 3 0 PS 1 (SUTURE) ×12 IMPLANT
SUT FIBERWIRE #2 38 T-5 BLUE (SUTURE)
SUT MNCRL AB 3-0 PS2 18 (SUTURE) ×3 IMPLANT
SUT VIC AB 0 CT1 27 (SUTURE) ×2
SUT VIC AB 0 CT1 27XBRD ANBCTR (SUTURE) ×1 IMPLANT
SUT VIC AB 1 CT1 27 (SUTURE) ×2
SUT VIC AB 1 CT1 27XBRD ANBCTR (SUTURE) ×1 IMPLANT
SUT VIC AB 2-0 CT1 27 (SUTURE) ×2
SUT VIC AB 2-0 CT1 TAPERPNT 27 (SUTURE) ×1 IMPLANT
SUT VICRYL 0 UR6 27IN ABS (SUTURE) ×21 IMPLANT
SUTURE FIBERWR #2 38 T-5 BLUE (SUTURE) IMPLANT
SYR 20ML LL LF (SYRINGE) ×6 IMPLANT
SYR 3ML LL SCALE MARK (SYRINGE) ×3 IMPLANT
SYR TB 1ML LUER SLIP (SYRINGE) ×3 IMPLANT
TOWEL GREEN STERILE (TOWEL DISPOSABLE) ×3 IMPLANT
TOWEL GREEN STERILE FF (TOWEL DISPOSABLE) ×3 IMPLANT
TUBING ARTHROSCOPY IRRIG 16FT (MISCELLANEOUS) ×3 IMPLANT
WAND STAR VAC 90 (SURGICAL WAND) IMPLANT
WATER STERILE IRR 1000ML POUR (IV SOLUTION) ×3 IMPLANT
YANKAUER SUCT BULB TIP NO VENT (SUCTIONS) ×3 IMPLANT

## 2019-06-02 NOTE — Anesthesia Postprocedure Evaluation (Addendum)
Anesthesia Post Note  Patient: Darrell King  Procedure(s) Performed: right shoulder arthroscopy , mini open rotator cuff repair (Right )     Patient location during evaluation: PACU Anesthesia Type: Regional Level of consciousness: lethargic Pain management: pain level controlled Vital Signs Assessment: post-procedure vital signs reviewed and stable Respiratory status: spontaneous breathing and respiratory function stable Cardiovascular status: stable Postop Assessment: no apparent nausea or vomiting Anesthetic complications: yes Anesthetic complication details: anesthesia complicationsComments: While preparing to leave the hospital, pt developed dizziness and became light headed.  Never losing consciousness.  Vital signs demonstrated bradycardia with SBP of 100.  IV was inserted and glycopyrrolate administered.  His pulse rate improved, but he remained lethargic and despite improved hemodynamics.  Code stroke called and patient sent to CT scan. Ct scan negative, pt improved.  Will be admitted.    Last Vitals:  Vitals:   06/02/19 1930 06/02/19 2030  BP: 115/66 131/82  Pulse: 77 73  Resp: (!) 21 17  Temp:    SpO2: 96% 100%    Last Pain:  Vitals:   06/02/19 2030  TempSrc:   PainSc: 10-Worst pain ever                 Sascha Palma DANIEL

## 2019-06-02 NOTE — Progress Notes (Signed)
Returned to PACU

## 2019-06-02 NOTE — Anesthesia Procedure Notes (Signed)
Anesthesia Regional Block: Interscalene brachial plexus block   Pre-Anesthetic Checklist: ,, timeout performed, Correct Patient, Correct Site, Correct Laterality, Correct Procedure, Correct Position, site marked, Risks and benefits discussed,  Surgical consent,  Pre-op evaluation,  At surgeon's request and post-op pain management  Laterality: Right  Prep: chloraprep       Needles:  Injection technique: Single-shot  Needle Type: Echogenic Stimulator Needle     Needle Length: 9cm  Needle Gauge: 21     Additional Needles:   Procedures:,,,, ultrasound used (permanent image in chart),,,,  Narrative:  Start time: 06/02/2019 1:10 PM End time: 06/02/2019 1:20 PM Injection made incrementally with aspirations every 5 mL.  Performed by: Personally  Anesthesiologist: Murvin Natal, MD  Additional Notes: Functioning IV was confirmed and monitors were applied.  A timeout was performed. Sterile prep, hand hygiene and sterile gloves were used. A 53mm 21ga Arrow echogenic stimulator needle was used. Negative aspiration and negative test dose prior to incremental administration of local anesthetic. The patient tolerated the procedure well.  Ultrasound guidance: relevent anatomy identified, needle position confirmed, local anesthetic spread visualized around nerve(s), vascular puncture avoided.  Image printed for medical record.

## 2019-06-02 NOTE — Anesthesia Procedure Notes (Addendum)
Procedure Name: Intubation Date/Time: 06/02/2019 2:21 PM Performed by: Bryson Corona, CRNA Pre-anesthesia Checklist: Patient identified, Emergency Drugs available, Suction available and Patient being monitored Patient Re-evaluated:Patient Re-evaluated prior to induction Oxygen Delivery Method: Circle System Utilized Preoxygenation: Pre-oxygenation with 100% oxygen Induction Type: IV induction and Rapid sequence Laryngoscope Size: Mac and 4 Grade View: Grade I Tube type: Oral Tube size: 7.0 mm Number of attempts: 1 Airway Equipment and Method: Stylet Placement Confirmation: ETT inserted through vocal cords under direct vision,  positive ETCO2 and breath sounds checked- equal and bilateral Secured at: 23 cm Tube secured with: Tape Dental Injury: Teeth and Oropharynx as per pre-operative assessment

## 2019-06-02 NOTE — Anesthesia Pain Management Evaluation Note (Signed)
Report attempted x 1

## 2019-06-02 NOTE — Transfer of Care (Signed)
Immediate Anesthesia Transfer of Care Note  Patient: Darrell King  Procedure(s) Performed: right shoulder arthroscopy , mini open rotator cuff repair (Right )  Patient Location: PACU  Anesthesia Type:GA combined with regional for post-op pain  Level of Consciousness: drowsy  Airway & Oxygen Therapy: Patient Spontanous Breathing and Patient connected to nasal cannula oxygen  Post-op Assessment: Report given to RN, Post -op Vital signs reviewed and stable and Patient moving all extremities  Post vital signs: Reviewed and stable  Last Vitals:  Vitals Value Taken Time  BP 139/76 06/02/19 1805  Temp    Pulse 88 06/02/19 1812  Resp 27 06/02/19 1812  SpO2 92 % 06/02/19 1812  Vitals shown include unvalidated device data.  Last Pain:  Vitals:   06/02/19 1805  TempSrc:   PainSc: (P) Asleep      Patients Stated Pain Goal: 2 (83/25/49 8264)  Complications: No apparent anesthesia complications

## 2019-06-02 NOTE — Progress Notes (Signed)
Patient was somnolent and somewhat confused in recovery room.  Changed him to overnight observation.  At this time the patient has some left-sided weakness and is more confused.  Stroke team is evaluating with head CT pending.

## 2019-06-02 NOTE — Op Note (Signed)
NAME: Darrell King, Darrell B. MEDICAL RECORD [id number] ACCOUNT 19[name] DATE OF BIRTH:1966/05/18 FACILITY: [facility] LOCATION: [facility] PHYSICIAN:GREGORY Diamantina ProvidenceS. 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  PROCEDURE:  Right shoulder arthroscopy with examination of the joint and revision AC joint resection with revision rotator cuff tear repair.  SURGEON:  Cammy CopaGregory Scott Dean, MD  ASSISTANT:  Karenann CaiLuke Magnant, PA.  INDICATIONS:  This is a 53 year old patient who underwent shoulder surgery about a year ago.  Did well for 8-9 months and then reported recurrent pain in the shoulder.  Localized the pain primarily to the superior aspect of the shoulder, but also  reported weakness.  MRI scan showed some regrowth of osteophytes from the resected distal end of the clavicle, which could be impinging as well as partial thickness recurrent supraspinatus tear.  He presents now for operative management after explanation  of risks and benefits.  PROCEDURE IN DETAIL:  The patient was brought to the operating room where general anesthetic was induced.  Preoperative antibiotics administered.  Timeout was called.  The patient was placed in the beach chair position with the head in neutral position.   Right arm was examined under anesthesia and found to have good stability with full passive range of motion.  The shoulder was then pre-scrubbed with alcohol and Betadine, allowed to air dry.  Prep with DuraPrep solution and draped in a sterile manner.   Ioban used to cover the operative field.  Timeout was called.  Posterior portal was created.  Anterior portal created under direct visualization.  Diagnostic arthroscopy demonstrated partial thickness tearing of  the supraspinatus with visualization of  the sutures, but no full thickness component of the re-tear was present.  The patient had intact glenohumeral articular surfaces.  Mild debridement was performed of some labral fraying, but this was not particularly significant.  At this time,  instruments were removed from the portals, which were closed with 3-0 nylon.  Attention was directed towards the distal clavicle.  Prior incision was made and extended proximally and distally.  Skin and subcutaneous tissue were sharply divided.  The  resected plane of the clavicle was then visualized.  Soft tissue was removed.  There was some anterior osteophyte overgrowth, which was removed with a rongeur.  About 4 more millimeters of the distal clavicle was resected including back to the posterior  aspect in order to achieve full decompression of that space.  I could put my index finger in the space along the length of the distal clavicle and thus this space was fully decompressed.  This incision was thoroughly irrigated and closed using 0 Vicryl  suture, 2-0 Vicryl suture and 3-0 nylon.  Attention then directed towards the rotator cuff.  Incision made off the anterolateral margin of the acromion.  Skin and subcutaneous tissue were sharply divided.  A suture was placed about 4 cm from the  anterolateral margin of the acromion.  This was a stay suture.  Deltoid was split between the anterior and middle raphae.  A bursectomy performed.  There was no full thickness re-tear portion of this rotator cuff tear, but there was a thinned out portion  of the supraspinatus.  This was elevated sharply and 2 more PushLocks were placed.  The footprint was prepared.  All in all, about a 2 cm wide  repair was made using 2 Arthrex 5.5 corkscrews and 2 PushLocks.  All in all an excellent repair was achieved  incorporating the delaminated portion of the supraspinatus.  At this time, thorough irrigation was performed of the joint, which was then  closed using 0 Vicryl suture for the deltoid split followed by interrupted inverted 0 Vicryl suture, 2-0 Vicryl  suture and a 3-0 Monocryl for the shoulder itself.  Impervious dressings and shoulder immobilizer placed.  Lurena Joiner Magnant's assistance was required at all times during the case for retraction and mobilization of the tissue and opening and closure.  His  assistance was a medical necessity.  TN/NUANCE  D:06/02/2019 T:06/02/2019 JOB:[id number]

## 2019-06-02 NOTE — Progress Notes (Signed)
MD singer at bedside.  Pt still very lethargic.  MD wants to give him 1L LR.  Will continue to monitor and notify for changes.

## 2019-06-02 NOTE — Progress Notes (Signed)
Called Anesthesia MD Tobias Alexander to let him know that pt's HR and BP has improved after Robinul but pt continues to be dizzy and lethargic.  When pt is sitting up high in the bed he says he is dizzy and wants to lay back down.  Pt keeps his eyes closed.  Pt is a&ox4.  VSS HR 70's, BP 120's/70's.  Will continue to monitor and told MD that I will call the operating surgeon.

## 2019-06-02 NOTE — Progress Notes (Signed)
Patient seen and examined Answers questions appropriately Is having no real shoulder pain but primarily headache Discussed with the neurologist the plan of action On exam the patient is moving his left arm and right and left lower extremities with reasonably good strength. No facial droop type issues He is reporting a headache and he did have 2 Tylenol prior to surgery Head CT negative for hemorrhagic stroke  Plan at this time per the neurologist is observation tonight with possible MRI scan tomorrow to evaluate for stroke. Nothing really focal at this time on neurological exam Plan on tramadol for pain but hold on aspirin for now Tylenol thousand milligrams tonight for headache Want to hold off on any other medications just to make sure this is not adding to any type of mental confusion issues H is confusion does seem to be better now than it was immediately after surgery  MRI scan at this time not indicated per the neurologist because TPA would not be given so soon after surgery Clinical presentation of confusion with no focal defect not necessarily consistent with TIA. We will see how he is doing tomorrow.

## 2019-06-02 NOTE — Progress Notes (Signed)
Report received from CJ RN.   

## 2019-06-02 NOTE — Progress Notes (Signed)
MD Marlou Sa updated

## 2019-06-02 NOTE — H&P (Signed)
Darrell ShinesCharles B King is an 53 y.o. male.   Chief Complaint: Right shoulder pain HPI: Darrell MostCharles is a 53 year old patient with right shoulder pain.  About a year ago he had surgery for cuff repair and distal clavicle excision along with biceps tenodesis.  Did well for about 7 or 8 months and then had recurrent pain.  Subsequent MRI scan shows possible re-tear of the rotator cuff tear repair along with some spurring from the prior distal clavicle excision site.  He is failed conservative measures and presents now for operative management after explanation of risks and benefits.  Past Medical History:  Diagnosis Date  . Acute gout 12/31/2016  . Anxiety    "had taken Paxil; back in 1996; nothing since" (10/21/2013)  . Arthritis   . Asthma    asa child  . Complication of anesthesia    SLOW TO WAKE UP AFTER SURGERY IN 2019  . Dyslipidemia 03/11/2016   12.5% 10-year risk of heart disease or stroke, should be on a moderate/high intensity statin.    . Essential hypertension, benign 02/23/2016  . Refusal of blood transfusions as patient is Jehovah's Witness     Past Surgical History:  Procedure Laterality Date  . COLONOSCOPY W/ POLYPECTOMY    . INGUINAL HERNIA REPAIR Right 1995  . INGUINAL HERNIA REPAIR Left 11/12/2013   Procedure: HERNIA REPAIR INGUINAL INCARCERATED;  Surgeon: Adolph Pollackodd J Rosenbower, MD;  [facility];  Service: General;  Laterality: Left;  . SHOULDER ARTHROSCOPY WITH SUBACROMIAL DECOMPRESSION, ROTATOR CUFF REPAIR AND BICEP TENDON REPAIR Right 07/14/2018   Procedure: RIGHT SHOULDER ARTHROSCOPY, DEBRIDEMENT, BICEPS TENODESIS, DISTAL CLAVICLE EXCISION AND MINI OPEN ROTATOR CUFF TEAR REPAIR;  Surgeon: Cammy Copaean, Addisynn Vassell Scott, MD;  Location: [facility] OR;  Service: Orthopedics;  Laterality: Right;    Family History  Problem Relation Age of Onset  . Colon cancer Mother        colon  . Cancer Sister        dont know what type   Social History:  reports that he has never smoked. He has never used  smokeless tobacco. He reports current alcohol use of about 1.0 - 2.0 standard drinks of alcohol per week. He reports that he does not use drugs.  Allergies: No Known Allergies  Medications Prior to Admission  Medication Sig Dispense Refill  . traMADol (ULTRAM) 50 MG tablet Take 1 tablet (50 mg total) by mouth every 12 (twelve) hours as needed. (Patient taking differently: Take 50 mg by mouth every 12 (twelve) hours as needed for moderate pain. ) 30 tablet 0  . amLODipine (NORVASC) 5 MG tablet Take 1 tablet (5 mg total) by mouth daily. (Patient not taking: Reported on 05/29/2019) 30 tablet 0  . aspirin EC 81 MG EC tablet Take 1 tablet (81 mg total) by mouth daily. (Patient not taking: Reported on 05/29/2019) 90 tablet 0  . atorvastatin (LIPITOR) 40 MG tablet Take 1 tablet (40 mg total) by mouth daily at 6 PM. (Patient not taking: Reported on 05/29/2019) 90 tablet 0  . ciprofloxacin-dexamethasone (CIPRODEX) OTIC suspension Place 4 drops into the right ear 2 (two) times daily. (Patient not taking: Reported on 05/29/2019) 7.5 mL 0    Results for orders placed or performed during the hospital encounter of 06/02/19 (from the past 48 hour(s))  CBC     Status: None   Collection Time: 06/02/19 11:45 AM  Result Value Ref Range   WBC 4.9 4.0 - 10.5 K/uL   RBC 5.22 4.22 - 5.81 MIL/uL  Hemoglobin 14.9 13.0 - 17.0 g/dL   HCT 44.5 39.0 - 52.0 %   MCV 85.2 80.0 - 100.0 fL   MCH 28.5 26.0 - 34.0 pg   MCHC 33.5 30.0 - 36.0 g/dL   RDW 13.4 11.5 - 15.5 %   Platelets 213 150 - 400 K/uL   nRBC 0.0 0.0 - 0.2 %    Comment: Performed at [facility], [place] [address] [place], [place] [address]  Basic metabolic panel     Status: Abnormal   Collection Time: 06/02/19 11:45 AM  Result Value Ref Range   Sodium 138 135 - 145 mmol/L   Potassium 3.2 (L) 3.5 - 5.1 mmol/L   Chloride 105 98 - 111 mmol/L   CO2 23 22 - 32 mmol/L   Glucose, Bld 137 (H) 70 - 99 mg/dL   BUN 12 6 - 20 mg/dL   Creatinine, Ser 1.08 0.61  - 1.24 mg/dL   Calcium 9.0 8.9 - 10.3 mg/dL   GFR calc non Af Amer >60 >60 mL/min   GFR calc Af Amer >60 >60 mL/min   Anion gap 10 5 - 15    Comment: Performed at [facility], [place] [address], [place], [place] [address]   No results found.  ROS All systems reviewed are negative as they relate to the chief complaint within the history of present illness.  Patient denies  fevers or chills.   Blood pressure (!) 154/100, pulse 73, temperature 98.5 F (36.9 C), temperature source Oral, resp. rate 11, height 6\' 9"  (2.057 m), weight 133.8 kg, SpO2 92 %. Physical Exam   Constitutional: Patient appears well-developed HEENT:  Head: Normocephalic Eyes:EOM are normal Neck: Normal range of motion Cardiovascular: Normal rate Pulmonary/chest: Effort normal Neurologic: Patient is alert Skin: Skin is warm Psychiatric: Patient has normal mood and affect Ortho exam demonstrates some tenderness at that superior aspect where the prior distal clavicle excision was performed.  No Popeye deformity.  Does have a little bit of coarseness with passive range of motion but overall his strength is good to subscap and supraspinatus and infraspinatus testing.  Slightly weaker to supraspinatus testing against resistance on the right versus left.   Assessment/Plan Impression is high-grade partial-thickness re-tearing of prior rotator cuff repair.  Plan is arthroscopy with evaluation inside the joint along with repeat rotator cuff repair and possible spur removal from the distal clavicle.  Patient understands risk and benefits of the procedure.  All questions answered.  Anticipate that he could go back to light duty work after about 2 to 3 weeks but in terms of physical work such as maintenance that skin to be closer to a 2 to 102-month type of endeavor.  Anderson Malta, MD 06/02/2019, 2:08 PM

## 2019-06-02 NOTE — Progress Notes (Signed)
Pt has had sudden changes in LOC complaining of 10/10 pain in his head and unable to answer questions appropriately.  Pt unable to follow commands, unable to lift or squeeze with left arm or left leg.  Pt able to move right leg to command.  Right arm has had a block.  This nurse is calling rapid response.

## 2019-06-02 NOTE — Progress Notes (Signed)
Called pts wife to update her on the situation.  Pts wife reports that he has had a difficult time waking up from surgery before.  She requested to talk to the pt.  Phone was taken to the bedside and pts wife was able to speak to him.  Will keep her updated with pts condition.

## 2019-06-02 NOTE — Progress Notes (Signed)
Rapid response RN called and will be to the bedside.  Code cart at bedside.  Pads applied.  Pt VSS.

## 2019-06-02 NOTE — Progress Notes (Signed)
Notified MD Marlou Sa about the pt's situation.  Pt doesn't appear to be a good candidate to go home at this time.  MD will call back.

## 2019-06-02 NOTE — Significant Event (Signed)
Rapid Response Event Note- Inpatient Code Stroke  Overview: Time Called: 2035 Arrival Time: 2035 Event Type: Neurologic(Left weakness)  Initial Focused Assessment: Called by staff regarding new onset of Left arm and leg weakness.  Upon arrival, Darrell King was lying supine in the stretcher, drowsy, intermittently following commands and answering questions. LSW 2030. S/p Right shoulder arthroscopy with rotator cuff repair. Pt received a right Interscalene brachial plexus block earlier today. RUE is numb and unable to move due to block.  Dr. Lorraine Lax met [place] in CT at the bedside. CTH/CTA done. No acute occlussion or LVO. Pt is not a candidate for TPA.   NIHSS:  1a 1 1b 2 1c 0 2   0 3   0 4   0 5a 0 5b UN 6a 2 6b 0 7   0 _0 0 10 0 11 1 TOTAL- 8  Interventions: -Stat CTH/CTA   Event Summary: Name of Physician Notified: Dr. Lorraine Lax at 2035    at    Outcome: Other (Comment)(admitted)  Event End Time: 2140  [name]

## 2019-06-02 NOTE — Anesthesia Postprocedure Evaluation (Signed)
Anesthesia Post Note  Patient: Darrell King  Procedure(s) Performed: right shoulder arthroscopy , mini open rotator cuff repair (Right )     Patient location during evaluation: PACU Anesthesia Type: Regional and General Level of consciousness: awake and alert Pain management: pain level controlled Vital Signs Assessment: post-procedure vital signs reviewed and stable Respiratory status: spontaneous breathing, nonlabored ventilation, respiratory function stable and patient connected to nasal cannula oxygen Cardiovascular status: blood pressure returned to baseline and stable Postop Assessment: no apparent nausea or vomiting Anesthetic complications: no    Last Vitals:  Vitals:   06/02/19 1849 06/02/19 1930  BP: 125/73 115/66  Pulse: 78 77  Resp: (!) 23 (!) 21  Temp: (!) 36.1 C   SpO2: 94% 96%    Last Pain:  Vitals:   06/02/19 1834  TempSrc:   PainSc: 4                  Ahmoni Edge P Orly Quimby

## 2019-06-02 NOTE — Brief Op Note (Signed)
   06/02/2019  5:37 PM  PATIENT:  Darrell King  53 y.o. male  PRE-OPERATIVE DIAGNOSIS:  right shoulder rotator cuff tear, acromioclavicular joint spurring  POST-OPERATIVE DIAGNOSIS:  right shoulder rotator cuff tear, acromioclavicular joint spurring  PROCEDURE:  Procedure(s): right shoulder arthroscopy , mini open rotator cuff repair  SURGEON:  Surgeon(s): Marlou Sa Tonna Corner, MD  ASSISTANT: Annie Main pa  ANESTHESIA:   general  EBL: 25 ml    Total I/O In: 1000 [I.V.:1000] Out: 100 [Blood:100]  BLOOD ADMINISTERED: none  DRAINS: none   LOCAL MEDICATIONS USED:  none  SPECIMEN:  No Specimen  COUNTS:  YES  TOURNIQUET:  * No tourniquets in log *  DICTATION: .Other Dictation: Dictation Number [id number]  PLAN OF CARE: Discharge to home after PACU  PATIENT DISPOSITION:  PACU - hemodynamically stable

## 2019-06-02 NOTE — Addendum Note (Signed)
Addendum  created 06/02/19 2207 by Duane Boston, MD   Clinical Note Signed, Intraprocedure Staff edited

## 2019-06-02 NOTE — Consult Note (Signed)
Requesting Physician: Dr. Marlou Sa    Chief Complaint: Left side weakness, confusion  History obtained from: Patient and Chart    HPI:                                                                                                                                       Darrell King is a 53 y.o. male with past medical history of hypertension, dyslipidemia, anxiety status post arthroscopy for right shoulder rotator cuff injury was stroke alerted for sudden onset confusion and left-sided weakness.  Patient was doing fine postoperatively, until around 7:30 PM while he was preparing to leave the hospital-he felt dizzy and lightheaded.  Noted to have bradycardia and low blood pressure.  Received glycopyrrolate, but remains lethargic.  He also received a Dilaudid around this time.  Around 8.30 pm, a code stroke was activated as patient continued to remain lethargic and confused and also noted to have left-sided weakness.  His hemodynamics had improved at the time and his blood pressure was 009 systolic.  On assessment in CT scan, patient was upset and tearful.  He appeared to be moving the right leg more than the left leg.  Right arm was paralyzed, he had a reduced grip strength in the left arm.  Patient also complained of 10 out of 10 headache.  A stat CT head was obtained which showed no hemorrhage.  tPA was not administered due to recent surgery.  CT angiogram was negative for large vessel occlusion/stenosis.  Date last known well: 8 4.20 Time last known well: 8:30 PM tPA Given: No, recent surgery NIHSS: 9 Baseline MRS 0   Past Medical History:  Diagnosis Date  . Acute gout 12/31/2016  . Anxiety    "had taken Paxil; back in 1996; nothing since" (10/21/2013)  . Arthritis   . Asthma    asa child  . Complication of anesthesia    SLOW TO WAKE UP AFTER SURGERY IN 2019  . Dyslipidemia 03/11/2016   12.5% 10-year risk of heart disease or stroke, should be on a moderate/high intensity statin.    .  Essential hypertension, benign 02/23/2016  . Refusal of blood transfusions as patient is Jehovah's Witness     Past Surgical History:  Procedure Laterality Date  . COLONOSCOPY W/ POLYPECTOMY    . INGUINAL HERNIA REPAIR Right 1995  . INGUINAL HERNIA REPAIR Left 11/12/2013   Procedure: HERNIA REPAIR INGUINAL INCARCERATED;  Surgeon: Odis Hollingshead, MD;  Location: [facility];  Service: General;  Laterality: Left;  . SHOULDER ARTHROSCOPY WITH SUBACROMIAL DECOMPRESSION, ROTATOR CUFF REPAIR AND BICEP TENDON REPAIR Right 07/14/2018   Procedure: RIGHT SHOULDER ARTHROSCOPY, DEBRIDEMENT, BICEPS TENODESIS, DISTAL CLAVICLE EXCISION AND MINI OPEN ROTATOR CUFF TEAR REPAIR;  Surgeon: Meredith Pel, MD;  Location: [facility];  Service: Orthopedics;  Laterality: Right;    Family History  Problem Relation Age of Onset  . Colon cancer Mother  colon  . Cancer Sister        dont know what type   Social History:  reports that he has never smoked. He has never used smokeless tobacco. He reports current alcohol use of about 1.0 - 2.0 standard drinks of alcohol per week. He reports that he does not use drugs.  Allergies: No Known Allergies  Medications:                                                                                                                        I reviewed home medications   ROS:                                                                                                                                     Unable to obtain due to patient's mental status   Examination:                                                                                                      General: Appears well-developed  Psych: Affect appropriate to situation Eyes: No scleral injection HENT: No OP obstrucion Head: Normocephalic.  Cardiovascular: Normal rate and regular rhythm. Respiratory: Effort normal and breath sounds normal to anterior ascultation GI: Soft.  No distension. There is no  tenderness.  Skin: WDI    Neurological Examination Mental Status: Drowsy, following commands intermittently.  Not answering questions, states I do not know. Cranial Nerves: II: Visual fields : Blinks to threat bilaterally III,IV, VI: ptosis not present, extra-ocular motions intact bilaterally, pupils equal, round, reactive to light and accommodation V,VII: smile symmetric VIII: hearing normal bilaterally IX,X: uvula rises symmetrically XII: midline tongue extension Motor: Right : Upper extremity   ( not tested)    Left:     Upper extremity   4/5  Lower extremity   4/5     Lower extremity   2/5 Tone and bulk:normal tone throughout; no atrophy noted Sensory: Appears to have confusion from light to left/unclear  if this is neglect. Deep Tendon Reflexes: 2+ and symmetric throughout Plantars: Right: downgoing   Left: downgoing Cerebellar: Was not tested due to cooperation     Lab Results: Basic Metabolic Panel: Recent Labs  Lab 06/02/19 1145  NA 138  K 3.2*  CL 105  CO2 23  GLUCOSE 137*  BUN 12  CREATININE 1.08  CALCIUM 9.0    CBC: Recent Labs  Lab 06/02/19 1145  WBC 4.9  HGB 14.9  HCT 44.5  MCV 85.2  PLT 213    Coagulation Studies: No results for input(s): LABPROT, INR in the last 72 hours.  Imaging: Ct Angio Head W Or Wo Contrast  Result Date: 06/02/2019 CLINICAL DATA:  Code stroke.  Left-sided weakness.  Confusion. EXAM: CT ANGIOGRAPHY HEAD AND NECK TECHNIQUE: Multidetector CT imaging of the head and neck was performed using the standard protocol during bolus administration of intravenous contrast. Multiplanar CT image reconstructions and MIPs were obtained to evaluate the vascular anatomy. Carotid stenosis measurements (when applicable) are obtained utilizing NASCET criteria, using the distal internal carotid diameter as the denominator. CONTRAST:  75mL OMNIPAQUE IOHEXOL 350 MG/ML SOLN COMPARISON:  Head CT earlier same day FINDINGS: CTA NECK FINDINGS Aortic  arch: Aortic arch is normal without atherosclerotic change, aneurysm or dissection. Right carotid system: Common carotid artery widely patent to the bifurcation. Carotid bifurcation is normal without soft or calcified plaque. Cervical ICA is tortuous but widely patent. Left carotid system: Common carotid artery widely patent to the bifurcation. Carotid bifurcation is normal without soft or calcified plaque. Cervical ICA is tortuous but widely patent. Vertebral arteries: Right vertebral artery is dominant. Both vertebral artery origins appear patent. Both vertebral arteries are patent through the cervical region to the foramen magnum. Skeleton: Mild cervical spondylosis. Other neck: No mass or lymphadenopathy. Upper chest: Mild dependent pulmonary atelectasis. Patchy bronchopneumonia not excluded, particularly in the right lower lobe. Review of the MIP images confirms the above findings CTA HEAD FINDINGS Anterior circulation: Both internal carotid arteries are patent through the skull base and siphon regions. The anterior and middle cerebral vessels are patent without proximal stenosis, aneurysm or vascular malformation. No missing branch vessels are identified. Posterior circulation: Right vertebral artery is patent to the basilar. Left vertebral artery terminates in PICA. No basilar stenosis. Posterior circulation branch vessels are patent. Bilateral posterior communicating arteries. Venous sinuses: Patent and normal. Anatomic variants: None significant. Review of the MIP images confirms the above findings IMPRESSION: Negative CT angiography. No evidence of atherosclerotic disease. No large or medium vessel occlusion. Patchy atelectasis and or pneumonia in the dependent lungs right more than left. These results were communicated to Dr. Laurence SlateAroor at 9:25 pmon 8/4/2020by text page via the [facility] messaging system. Electronically Signed   By: Paulina FusiMark  Shogry M.D.   On: 06/02/2019 21:25   Ct Angio Neck W Or Wo  Contrast  Result Date: 06/02/2019 CLINICAL DATA:  Code stroke.  Left-sided weakness.  Confusion. EXAM: CT ANGIOGRAPHY HEAD AND NECK TECHNIQUE: Multidetector CT imaging of the head and neck was performed using the standard protocol during bolus administration of intravenous contrast. Multiplanar CT image reconstructions and MIPs were obtained to evaluate the vascular anatomy. Carotid stenosis measurements (when applicable) are obtained utilizing NASCET criteria, using the distal internal carotid diameter as the denominator. CONTRAST:  75mL OMNIPAQUE IOHEXOL 350 MG/ML SOLN COMPARISON:  Head CT earlier same day FINDINGS: CTA NECK FINDINGS Aortic arch: Aortic arch is normal without atherosclerotic change, aneurysm or dissection. Right carotid system: Common carotid artery  widely patent to the bifurcation. Carotid bifurcation is normal without soft or calcified plaque. Cervical ICA is tortuous but widely patent. Left carotid system: Common carotid artery widely patent to the bifurcation. Carotid bifurcation is normal without soft or calcified plaque. Cervical ICA is tortuous but widely patent. Vertebral arteries: Right vertebral artery is dominant. Both vertebral artery origins appear patent. Both vertebral arteries are patent through the cervical region to the foramen magnum. Skeleton: Mild cervical spondylosis. Other neck: No mass or lymphadenopathy. Upper chest: Mild dependent pulmonary atelectasis. Patchy bronchopneumonia not excluded, particularly in the right lower lobe. Review of the MIP images confirms the above findings CTA HEAD FINDINGS Anterior circulation: Both internal carotid arteries are patent through the skull base and siphon regions. The anterior and middle cerebral vessels are patent without proximal stenosis, aneurysm or vascular malformation. No missing branch vessels are identified. Posterior circulation: Right vertebral artery is patent to the basilar. Left vertebral artery terminates in PICA. No  basilar stenosis. Posterior circulation branch vessels are patent. Bilateral posterior communicating arteries. Venous sinuses: Patent and normal. Anatomic variants: None significant. Review of the MIP images confirms the above findings IMPRESSION: Negative CT angiography. No evidence of atherosclerotic disease. No large or medium vessel occlusion. Patchy atelectasis and or pneumonia in the dependent lungs right more than left. These results were communicated to Dr. Laurence Slate at 9:25 pmon 8/4/2020by text page via the [facility] messaging system. Electronically Signed   By: Paulina Fusi M.D.   On: 06/02/2019 21:25   Ct Head Code Stroke Wo Contrast  Result Date: 06/02/2019 CLINICAL DATA:  Code stroke.  Acute onset shin. EXAM: CT HEAD WITHOUT CONTRAST TECHNIQUE: Contiguous axial images were obtained from the base of the skull through the vertex without intravenous contrast. COMPARISON:  12/29/2018 FINDINGS: Brain: Normal appearance without evidence of atrophy, old or acute infarction, mass lesion, hemorrhage, hydrocephalus or extra-axial collection. Vascular: Abnormal vascular finding. Skull: Normal Sinuses/Orbits: Clear/normal Other: None ASPECTS (Alberta Stroke Program Early CT Score), applies to either hemisphere in this case. - Ganglionic level infarction (caudate, lentiform nuclei, internal capsule, insula, M1-M3 cortex): 7 - Supraganglionic infarction (M4-M6 cortex): 3 Total score (0-10 with 10 being normal): 10 IMPRESSION: 1. Normal head CT. 2. ASPECTS is 10, with respect to either hemisphere. 3. These results were communicated to Dr. Laurence Slate at 9:00 pmon 8/4/2020by text page via the [facility] messaging system. Electronically Signed   By: Paulina Fusi M.D.   On: 06/02/2019 21:00     ASSESSMENT AND PLAN   52 y.o. male with past medical history of hypertension, dyslipidemia, anxiety status post arthroscopy for right shoulder rotator cuff injury was stroke alerted for sudden onset confusion and left-sided weakness.  This  was a difficult exam due to poor cooperation.  Patient did not have a facial droop.  He did appear to be moving the left side less (unable to test right arm strength, right leg appears stronger than the left leg).  Unclear how much of this is due to poor cooperation and altered mental status.  He is not a candidate for any acute intervention (no TPA due to recent surgery, no large vessel occlusion present for mechanical thrombectomy).  I do wonder if this is a side effect from narcotics, anticholinergics.  Would recommend keeping the patient for observation and obtaining MRI brain to rule out stroke.  If MRI is negative less likely to be a TIA, due to confusion in the postoperative setting.  I also look for carotid stenosis to see if patient  may be having cerebral hypoperfusion, however this does not seem to be the case.   Acute toxic/metabolic encephalopathy-possibly side effect of anticholinergic versus narcotics Possible acute ischemic stroke Left hemiparesis  Recommendations Admit for observation MRI brain without contrast to evaluate stroke Echocardiogram Recommend tramadol for pain, avoid narcotics Every 2 hours neurochecks Avoid hypotension, permissive hypertension up to 180/110 mmHg    Isiac Breighner [facility] Pager Number [phone]

## 2019-06-02 NOTE — Progress Notes (Signed)
Notified Anesthesia MD that pt is complaining of a bad headache.  Will continue to monitor.  MD wants to give pain medication.  Will notify for changes.

## 2019-06-03 ENCOUNTER — Observation Stay (HOSPITAL_COMMUNITY): Payer: 59

## 2019-06-03 ENCOUNTER — Encounter (HOSPITAL_COMMUNITY): Payer: Self-pay | Admitting: Orthopedic Surgery

## 2019-06-03 DIAGNOSIS — R42 Dizziness and giddiness: Secondary | ICD-10-CM | POA: Diagnosis not present

## 2019-06-03 DIAGNOSIS — M75111 Incomplete rotator cuff tear or rupture of right shoulder, not specified as traumatic: Secondary | ICD-10-CM | POA: Diagnosis not present

## 2019-06-03 MED ORDER — ASPIRIN 81 MG PO TBEC: 81.0000 mg | DELAYED_RELEASE_TABLET | Freq: Every day | ORAL | 0 refills | Status: DC

## 2019-06-03 NOTE — Progress Notes (Signed)
EEG complete - results pending 

## 2019-06-03 NOTE — Evaluation (Signed)
Occupational Therapy Evaluation Patient Details Name: Darrell King MRN: [id number] DOB: Apr 06, 1966 Today's Date: 06/03/2019    History of Present Illness Darrell King is a 53 y.o. male with past medical history of hypertension, dyslipidemia, anxiety status post arthroscopy for right shoulder rotator cuff injury was stroke alerted for sudden onset confusion and left-sided weakness.   Clinical Impression   Pt PTA: living with spouse and independent. Pt currently limited by R arm weakness, numbness and poor fine and gross motor coordination. Pt requires increased assist for ADL to minA and mobility with supervisionA with no AD. Pt given handout for rotator cuff repairs and education provided for  Home exercise program and positioning. Pt given handout. Pt moving wrist through digits well, but is limited to PROM at shoulder and AAROM at elbow. Pt to exercise at least 3x daily. Pt education on sling donning/doffing. Pt would greatly benefit from continued OT skilled services for ADL, mobility and safety. OT to follow acutely.  Exercises: PROM: shoulder flex 0 to 90, shoulder abduction 0-60 and 0-30 external rotation    Follow Up Recommendations  Outpatient OT;Supervision - Intermittent;Follow surgeon's recommendation for DC plan and follow-up therapies    Equipment Recommendations  None recommended by OT    Recommendations for Other Services       Precautions / Restrictions Precautions Precautions: Shoulder Type of Shoulder Precautions: rotator cuff ARPM/PROM Forward Flex: 0-90; shoulder abduction 0-60; shoulder ER 0-30/ Shoulder Interventions: Shoulder sling/immobilizer Precaution Booklet Issued: Yes (comment) Precaution Comments: verbally discussed Required Braces or Orthoses: Sling Restrictions Weight Bearing Restrictions: Yes RUE Weight Bearing: Non weight bearing      Mobility Bed Mobility Overal bed mobility: Needs Assistance Bed Mobility: Sidelying to Sit    Sidelying to sit: Supervision       General bed mobility comments: use of rails, supevisionA for NWB RU  Transfers Overall transfer level: Needs assistance   Transfers: Sit to/from Stand Sit to Stand: Supervision         General transfer comment: supevisionA to make sure pt did not plop    Balance Overall balance assessment: No apparent balance deficits (not formally assessed)                                         ADL either performed or assessed with clinical judgement   ADL Overall ADL's : Needs assistance/impaired Eating/Feeding: Set up;Sitting   Grooming: Wash/dry hands;Set up;Standing;Cueing for safety   Upper Body Bathing: Minimal assistance;Sitting   Lower Body Bathing: Minimal assistance   Upper Body Dressing : Minimal assistance;Sitting   Lower Body Dressing: Min guard;Minimal assistance;Sitting/lateral leans;Sit to/from stand   Toilet Transfer: Designer, fashion/clothing and Hygiene: Minimal assistance;Sitting/lateral lean;Sit to/from stand       Functional mobility during ADLs: Supervision/safety;Min guard General ADL Comments: Set-up to minA overall for ADL due to RU NWB and limited with assist level     Vision Baseline Vision/History: No visual deficits Vision Assessment?: No apparent visual deficits     Perception     Praxis      Pertinent Vitals/Pain Pain Assessment: 0-10 Pain Score: 5  Pain Location: headache and R shoulder Pain Descriptors / Indicators: Discomfort;Shooting Pain Intervention(s): Monitored during session     Hand Dominance Right   Extremity/Trunk Assessment Upper Extremity Assessment Upper Extremity Assessment: RUE deficits/detail RUE Deficits / Details: wrist/digits moving 2/5 MM grade;  R shoulder through elbow numb and weakness 1/5 MM grade RUE Sensation: decreased light touch RUE Coordination: decreased fine motor;decreased gross motor   Lower Extremity Assessment Lower  Extremity Assessment: Generalized weakness   Cervical / Trunk Assessment Cervical / Trunk Assessment: Normal   Communication Communication Communication: No difficulties   Cognition Arousal/Alertness: Awake/alert Behavior During Therapy: WFL for tasks assessed/performed Overall Cognitive Status: Within Functional Limits for tasks assessed                                     General Comments       Exercises Exercises: Other exercises Other Exercises Other Exercises: PROM: shoulder flex 0 to 90, shoulder abduction 0-60 and 0-30 external rotation   Shoulder Instructions      Home Living Family/patient expects to be discharged to:: Private residence Living Arrangements: Spouse/significant other Available Help at Discharge: Family;Available 24 hours/day Type of Home: House Home Access: Stairs to enter Entergy CorporationEntrance Stairs-Number of Steps: 3 Entrance Stairs-Rails: None Home Layout: Two level Alternate Level Stairs-Number of Steps: 7 Alternate Level Stairs-Rails: Right Bathroom Shower/Tub: Chief Strategy OfficerTub/shower unit   Bathroom Toilet: Standard     Home Equipment: None          Prior Functioning/Environment Level of Independence: Independent                 OT Problem List: Decreased strength;Decreased activity tolerance;Impaired balance (sitting and/or standing);Decreased coordination;Impaired UE functional use;Decreased safety awareness;Increased edema      OT Treatment/Interventions: Self-care/ADL training;Therapeutic exercise;Neuromuscular education;Energy conservation;DME and/or AE instruction;Therapeutic activities;Patient/family education;Balance training    OT Goals(Current goals can be found in the care plan section) Acute Rehab OT Goals Patient Stated Goal: to go home soon OT Goal Formulation: With patient Time For Goal Achievement: 06/17/19 Potential to Achieve Goals: Good ADL Goals Pt Will Perform Grooming: with modified independence;standing Pt  Will Perform Upper Body Dressing: with modified independence;standing Pt Will Perform Lower Body Dressing: with modified independence;sit to/from stand Pt/caregiver will Perform Home Exercise Program: Right Upper extremity;Increased ROM;Increased strength;With written HEP provided Additional ADL Goal #1: Pt supervisionA with ADL and mobility with good safey awareness  OT Frequency: Min 2X/week   Barriers to D/C:            Co-evaluation              AM-PAC OT "6 Clicks" Daily Activity     Outcome Measure Help from another person eating meals?: A Little Help from another person taking care of personal grooming?: A Little Help from another person toileting, which includes using toliet, bedpan, or urinal?: A Little Help from another person bathing (including washing, rinsing, drying)?: A Little Help from another person to put on and taking off regular upper body clothing?: A Little Help from another person to put on and taking off regular lower body clothing?: A Little 6 Click Score: 18   End of Session Equipment Utilized During Treatment: Gait belt;Other (comment)(simple sling for RU) Nurse Communication: Mobility status  Activity Tolerance: Patient limited by pain;Patient tolerated treatment well Patient left: in chair;with call bell/phone within reach  OT Visit Diagnosis: Unsteadiness on feet (R26.81);Muscle weakness (generalized) (M62.81);Pain Pain - Right/Left: Right Pain - part of body: Shoulder                Time: 0930-1010 OT Time Calculation (min): 40 min Charges:  OT General Charges $OT Visit: 1 Visit OT Evaluation $OT Eval  Moderate Complexity: 1 Mod OT Treatments $Self Care/Home Management : 8-22 mins $Therapeutic Exercise: 8-22 mins  Cristi LoronAllison (Jelenek) Glendell [place] OTR/L Acute Rehabilitation Services Pager: [phone] Office: [phone]   Lonzo CloudLLISON J Mahki Spikes 06/03/2019, 10:29 AM

## 2019-06-03 NOTE — Progress Notes (Signed)
Patient stable this a.m. Mood and affect appropriate Work with occupational therapy for shoulder motion exercises Currently an MRI scan EEG pending Anticipate discharge tomorrow assuming all studies are normal.

## 2019-06-03 NOTE — Progress Notes (Addendum)
Subjective: Darrell King was seen sitting up in his bed this morning working with PT. He states that the episode he had last night was very bizarre and he does not recall much from it. He states that something like that had not occurred prior in the past.   According to nursing notes the patient was dizzy, had a severe headache, and was  lethargic with hr 70s and bp 120/70s. The patient was unable to follow commands, he was unable to lift or squeeze left arm or left leg.   [name] wife mentioned that the patient had a difficult time waking up after a prior surgery.   Exam: Vitals:   06/03/19 0634 06/03/19 0824  BP: 122/61 122/63  Pulse:  66  Resp:  15  Temp: 98.3 F (36.8 C) 98.2 F (36.8 C)  SpO2: 96% 98%   Gen: In bed, NAD Resp: non-labored breathing, no acute distress Abd: soft, nt  Neuro: Mental Status: Patient is awake, alert, oriented to person, place, month, year, and situation. No signs of aphasia or neglect  Cranial Nerves: II: Visual Fields are full. Pupils are equal, round, and reactive to light.   III,IV, VI: EOMI without ptosis or diploplia.  V: Facial sensation is symmetric to temperature VII: Facial movement is symmetric.  VIII: hearing is intact to voice X: Uvula elevates symmetrically XI: Shoulder shrug is symmetric. XII: tongue is midline without atrophy or fasciculations.   Motor: Tone is normal. Bulk is normal. 5/5 strength in bilateral lower extremity. Not able to move right upper extremity, 5/5 left upper extremity.    Sensory: Sensation is symmetric to light touch and temperature in the arms and legs.  Deep Tendon Reflexes: 2+ and symmetric in the biceps and patellae.   Pertinent Labs:  BMP: na 138, k 3.2, bicarb 23, cr 1.08, ag 10 CBC: wbc 4.9, hb 14.9, hct 44, plt 213 Lipid panel: tcholes 201, trig 191, hdl 24, ldl 139 A1C: 5.4  Impression:  Darrell King is a 53 y.o male with htn, dyslipidemia, right rotator cuff injury s/p arthroscopy who  is being consulted for sudden onset dizziness, confusion and left sided weakness around 1930 8/4. Code stroke called at 2030.   CT head and CTA head neck without any acute intracranial abnormalities. Symptoms are thought to be acute toxic encephalopathy secondary to medication side effect from narcotics and anticholinergic agent (glycopyrolate and dilaudid).    Recommendations:  -aspirin 81mg  given today -EEG pending -MRI brain pending    Lars Mage, MD Internal Medicine PGY3 Pager:[phone] 06/03/2019, 9:23 AM

## 2019-06-03 NOTE — Procedures (Signed)
Patient Name: Darrell King  MRN: [id number]  Epilepsy Attending: Lora Havens  Referring Physician/Provider: Dr Lars Mage Date: 06/03/19 Duration: 25.52 mins  Patient history:   53yo F with dizziness. EEG to evaluate for seizures.   Level of alertness: awake, asleep  Technical aspects: This EEG study was done with scalp electrodes positioned according to the 10-20 International system of electrode placement. Electrical activity was acquired at a sampling rate of 500Hz  and reviewed with a high frequency filter of 70Hz  and a low frequency filter of 1Hz . EEG data were recorded continuously and digitally stored.    DESCRIPTION: The posterior dominant rhythm consists of 9-10 Hz activity of moderate voltage (25-35 uV) seen predominantly in posterior head regions, symmetric and reactive to eye opening and eye closing. Sleep was characterized by vertex waves and sleep spindles (12-14Hz ), maximal frontocentral. Hyperventilation and photic stimulation were not performed.  IMPRESSION: This study is within normal limits. No seizures or epileptiform discharges were seen throughout the recording.  Dametrius Sanjuan [name]

## 2019-06-05 ENCOUNTER — Encounter (HOSPITAL_COMMUNITY): Payer: Self-pay | Admitting: Orthopedic Surgery

## 2019-06-05 NOTE — Discharge Summary (Signed)
Physician Discharge Summary      Patient ID: Darrell ShinesCharles B King MRN: [id number] DOB/AGE: Jul 15, 1966 53 y.o.  Admit date: 06/02/2019 Discharge date: 06/05/2019  Admission Diagnoses:  Active Problems:   Rotator cuff tear   S/P arthroscopy of right shoulder   Discharge Diagnoses:  Same  Surgeries: Procedure(s): right shoulder arthroscopy , mini open rotator cuff repair on 06/02/2019   Consultants:   Discharged Condition: Stable  Hospital Course: Darrell ShinesCharles B Entwistle is an 53 y.o. 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  They were brought to the operating room on 06/02/2019 and underwent the above named procedures.  The procedure went well and patient initially awoke from anesthesia feeling well.  However, he soon developed a severe headache along with left-sided weakness of his upper and lower extremity.  He was also lethargic, confused, and unable to follow commands in regards to moving his left upper and left lower extremity.  Patient was evaluated by neurology who ordered a CT head, MRI of the head, and an EEG.  All these tests showed no evidence of an ischemic event.  The next morning on postop day 1, patient regained full function of the left upper and left lower extremity with a reduction in the intensity of his headache (down to 5/10 instead of 10/10).  Patient was discharged home on postop day 1 and he will follow-up with Dr. August Saucerean in clinic in 2 weeks.  He will also follow-up with neurology in 1 month, as per their recommendations.  He was discharged on aspirin.  Antibiotics given:  Anti-infectives (From admission, onward)   Start     Dose/Rate Route Frequency Ordered Stop   06/02/19 2230  ceFAZolin (ANCEF) IVPB 2g/100 mL premix     2 g 200 mL/hr over 30 Minutes Intravenous Every 6 hours 06/02/19 2225 06/03/19 0546   06/02/19 1430  ceFAZolin (ANCEF) 3 g in dextrose 5 % 50 mL IVPB      3 g 100 mL/hr over 30 Minutes Intravenous To [facility] 06/01/19 1109 06/02/19 1423    .  Recent vital signs:  Vitals:   06/03/19 0824 06/03/19 1717  BP: 122/63 (!) 145/84  Pulse: 66 64  Resp: 15 16  Temp: 98.2 F (36.8 C) 98.1 F (36.7 C)  SpO2: 98% 100%    Recent laboratory studies:  Results for orders placed or performed during the hospital encounter of 06/02/19  CBC  Result Value Ref Range   WBC 4.9 4.0 - 10.5 K/uL   RBC 5.22 4.22 - 5.81 MIL/uL   Hemoglobin 14.9 13.0 - 17.0 g/dL   HCT 40.944.5 81.139.0 - 91.452.0 %   MCV 85.2 80.0 - 100.0 fL   MCH 28.5 26.0 - 34.0 pg   MCHC 33.5 30.0 - 36.0 g/dL   RDW 78.213.4 95.611.5 - 21.315.5 %   Platelets 213 150 - 400 K/uL   nRBC 0.0 0.0 - 0.2 %  Basic metabolic panel  Result Value Ref Range   Sodium 138 135 - 145 mmol/L   Potassium 3.2 (L) 3.5 - 5.1 mmol/L   Chloride 105 98 - 111 mmol/L   CO2 23 22 - 32 mmol/L   Glucose, Bld 137 (H) 70 - 99 mg/dL   BUN 12 6 - 20 mg/dL   Creatinine, Ser 0.861.08 0.61 - 1.24 mg/dL   Calcium 9.0 8.9 - 57.810.3 mg/dL   GFR calc  non Af Amer >60 >60 mL/min   GFR calc Af Amer >60 >60 mL/min   Anion gap 10 5 - 15  Glucose, capillary  Result Value Ref Range   Glucose-Capillary 107 (H) 70 - 99 mg/dL    Discharge Medications:   Allergies as of 06/03/2019   No Known Allergies     Medication List    TAKE these medications   amLODipine 5 MG tablet Commonly known as: NORVASC Take 1 tablet (5 mg total) by mouth daily.   aspirin 81 MG EC tablet Take 1 tablet (81 mg total) by mouth daily.   atorvastatin 40 MG tablet Commonly known as: LIPITOR Take 1 tablet (40 mg total) by mouth daily at 6 PM.   Ciprodex OTIC suspension Generic drug: ciprofloxacin-dexamethasone Place 4 drops into the right ear 2 (two) times daily.   methocarbamol 500 MG tablet Commonly known as: Robaxin Take 1 tablet (500 mg total) by mouth every 8 (eight) hours as needed for muscle spasms.   oxyCODONE 5 MG immediate release  tablet Commonly known as: Roxicodone Take 1 tablet (5 mg total) by mouth every 6 (six) hours as needed.   traMADol 50 MG tablet Commonly known as: ULTRAM Take 1 tablet (50 mg total) by mouth every 12 (twelve) hours as needed. What changed: reasons to take this       Diagnostic Studies: Ct Angio Head W Or Wo Contrast  Result Date: 06/02/2019 CLINICAL DATA:  Code stroke.  Left-sided weakness.  Confusion. EXAM: CT ANGIOGRAPHY HEAD AND NECK TECHNIQUE: Multidetector CT imaging of the head and neck was performed using the standard protocol during bolus administration of intravenous contrast. Multiplanar CT image reconstructions and MIPs were obtained to evaluate the vascular anatomy. Carotid stenosis measurements (when applicable) are obtained utilizing NASCET criteria, using the distal internal carotid diameter as the denominator. CONTRAST:  75mL OMNIPAQUE IOHEXOL 350 MG/ML SOLN COMPARISON:  Head CT earlier same day FINDINGS: CTA NECK FINDINGS Aortic arch: Aortic arch is normal without atherosclerotic change, aneurysm or dissection. Right carotid system: Common carotid artery widely patent to the bifurcation. Carotid bifurcation is normal without soft or calcified plaque. Cervical ICA is tortuous but widely patent. Left carotid system: Common carotid artery widely patent to the bifurcation. Carotid bifurcation is normal without soft or calcified plaque. Cervical ICA is tortuous but widely patent. Vertebral arteries: Right vertebral artery is dominant. Both vertebral artery origins appear patent. Both vertebral arteries are patent through the cervical region to the foramen magnum. Skeleton: Mild cervical spondylosis. Other neck: No mass or lymphadenopathy. Upper chest: Mild dependent pulmonary atelectasis. Patchy bronchopneumonia not excluded, particularly in the right lower lobe. Review of the MIP images confirms the above findings CTA HEAD FINDINGS Anterior circulation: Both internal carotid arteries are  patent through the skull base and siphon regions. The anterior and middle cerebral vessels are patent without proximal stenosis, aneurysm or vascular malformation. No missing branch vessels are identified. Posterior circulation: Right vertebral artery is patent to the basilar. Left vertebral artery terminates in PICA. No basilar stenosis. Posterior circulation branch vessels are patent. Bilateral posterior communicating arteries. Venous sinuses: Patent and normal. Anatomic variants: None significant. Review of the MIP images confirms the above findings IMPRESSION: Negative CT angiography. No evidence of atherosclerotic disease. No large or medium vessel occlusion. Patchy atelectasis and or pneumonia in the dependent lungs right more than left. These results were communicated to Dr. Laurence SlateAroor at 9:25 pmon 8/4/2020by text page via the [facility]. Electronically Signed   By:  Paulina Fusi M.D.   On: 06/02/2019 21:25   Ct Angio Neck W Or Wo Contrast  Result Date: 06/02/2019 CLINICAL DATA:  Code stroke.  Left-sided weakness.  Confusion. EXAM: CT ANGIOGRAPHY HEAD AND NECK TECHNIQUE: Multidetector CT imaging of the head and neck was performed using the standard protocol during bolus administration of intravenous contrast. Multiplanar CT image reconstructions and MIPs were obtained to evaluate the vascular anatomy. Carotid stenosis measurements (when applicable) are obtained utilizing NASCET criteria, using the distal internal carotid diameter as the denominator. CONTRAST:  75mL OMNIPAQUE IOHEXOL 350 MG/ML SOLN COMPARISON:  Head CT earlier same day FINDINGS: CTA NECK FINDINGS Aortic arch: Aortic arch is normal without atherosclerotic change, aneurysm or dissection. Right carotid system: Common carotid artery widely patent to the bifurcation. Carotid bifurcation is normal without soft or calcified plaque. Cervical ICA is tortuous but widely patent. Left carotid system: Common carotid artery widely patent to the  bifurcation. Carotid bifurcation is normal without soft or calcified plaque. Cervical ICA is tortuous but widely patent. Vertebral arteries: Right vertebral artery is dominant. Both vertebral artery origins appear patent. Both vertebral arteries are patent through the cervical region to the foramen magnum. Skeleton: Mild cervical spondylosis. Other neck: No mass or lymphadenopathy. Upper chest: Mild dependent pulmonary atelectasis. Patchy bronchopneumonia not excluded, particularly in the right lower lobe. Review of the MIP images confirms the above findings CTA HEAD FINDINGS Anterior circulation: Both internal carotid arteries are patent through the skull base and siphon regions. The anterior and middle cerebral vessels are patent without proximal stenosis, aneurysm or vascular malformation. No missing branch vessels are identified. Posterior circulation: Right vertebral artery is patent to the basilar. Left vertebral artery terminates in PICA. No basilar stenosis. Posterior circulation branch vessels are patent. Bilateral posterior communicating arteries. Venous sinuses: Patent and normal. Anatomic variants: None significant. Review of the MIP images confirms the above findings IMPRESSION: Negative CT angiography. No evidence of atherosclerotic disease. No large or medium vessel occlusion. Patchy atelectasis and or pneumonia in the dependent lungs right more than left. These results were communicated to Dr. Laurence Slate at 9:25 pmon 8/4/2020by text page via the [facility] messaging system. Electronically Signed   By: Paulina Fusi M.D.   On: 06/02/2019 21:25   Mr Brain Wo Contrast  Result Date: 06/03/2019 CLINICAL DATA:  53 year old male acute left side weakness, code stroke presentation yesterday. EXAM: MRI HEAD WITHOUT CONTRAST TECHNIQUE: Multiplanar, multiecho pulse sequences of the brain and surrounding structures were obtained without intravenous contrast. COMPARISON:  CT head and CTA head and neck yesterday. FINDINGS:  Brain: No restricted diffusion to suggest acute infarction. No midline shift, mass effect, evidence of mass lesion, ventriculomegaly, extra-axial collection or acute intracranial hemorrhage. Cervicomedullary junction and pituitary are within normal limits. Wallace Cullens and white matter signal is within normal limits for age throughout the brain. No encephalomalacia or chronic blood products identified. The deep gray nuclei, brainstem and cerebellum appear negative. Vascular: Major intracranial vascular flow voids are preserved, the right vertebral artery appears dominant. Skull and upper cervical spine: Negative visible cervical spine. Normal skull bone marrow signal. Sinuses/Orbits: Negative orbits. Paranasal sinuses and mastoids are well pneumatized. Other: Visible internal auditory structures appear normal. Scalp and face soft tissues appear negative. IMPRESSION: No acute intracranial abnormality. Negative noncontrast MRI appearance of the brain. Electronically Signed   By: Odessa Fleming M.D.   On: 06/03/2019 14:26   Mr Shoulder Right W Contrast  Result Date: 05/15/2019 CLINICAL DATA:  Pain status post right shoulder surgery  June 2019. EXAM: MR ARTHROGRAM OF THE RIGHT SHOULDER TECHNIQUE: Multiplanar, multisequence MR imaging of the right shoulder was performed following the administration of intra-articular contrast. CONTRAST:  See Injection Documentation. COMPARISON:  07/07/2018 FINDINGS: Rotator cuff: Prior rotator cuff repair. Large high-grade partial-thickness articular surface tear of the supraspinatus tendon. Mild tendinosis of the infraspinatus tendon. Teres minor tendon is intact. Subscapularis tendon is intact. Muscles: No atrophy or fatty replacement of nor abnormal signal within, the muscles of the rotator cuff. Biceps long head: Prior tenodesis. Acromioclavicular Joint: Moderate arthropathy of the acromioclavicular joint. Type II acromion. No significant subacromial/subdeltoid bursal fluid. Glenohumeral Joint:  Intraarticular contrast distending the joint capsule. Partial-thickness cartilage loss of the glenohumeral joint. Intermediate signal material within the axillary recess likely reflecting synovitis. Labrum: Intact. Bones: No acute osseous abnormality.  No aggressive osseous lesion. IMPRESSION: 1. Prior rotator cuff repair. Large high-grade partial-thickness articular surface tear of the supraspinatus tendon. 2. Mild tendinosis of the infraspinatus tendon. 3. Intermediate signal material within the axillary recess likely reflecting synovitis. Electronically Signed   By: Kathreen Devoid   On: 05/15/2019 09:18   Arthrogram  Result Date: 05/14/2019 CLINICAL DATA:  Chronic right shoulder pain. Prior surgery in September 2019 with continued pain. Concern for recurrent rotator cuff tear. FLUOROSCOPY TIME:  Radiation Exposure Index (as provided by the fluoroscopic device): 0.5 mGy Fluoroscopy Time:  3 seconds Number of Acquired Images:  0 PROCEDURE: The risks and benefits of the procedure were discussed with the patient, and written informed consent was obtained. The patient stated no history of allergy to contrast media. A formal timeout procedure was performed with the patient according to departmental protocol. The patient was placed supine on the fluoroscopy table and the right glenohumeral joint was identified under fluoroscopy. The skin overlying the right glenohumeral joint was subsequently cleaned with Betadine and a sterile drape was placed over the area of interest. 2 ml 1% Lidocaine was used to anesthetize the skin around the needle insertion site. A 22 gauge spinal needle was inserted into the right glenohumeral joint under fluoroscopy. 12 ml of gadolinium mixture (0.1 ml of Multihance mixed with 10 ml of Isovue-M 200 contrast and 10 ml of sterile saline) were injected into the right glenohumeral joint. The needle was removed and hemostasis was achieved. The patient was subsequently transferred to MRI for  imaging. IMPRESSION: Technically successful right shoulder injection for MRI. Electronically Signed   By: Titus Dubin M.D.   On: 05/14/2019 15:28   Ct Head Code Stroke Wo Contrast  Result Date: 06/02/2019 CLINICAL DATA:  Code stroke.  Acute onset shin. EXAM: CT HEAD WITHOUT CONTRAST TECHNIQUE: Contiguous axial images were obtained from the base of the skull through the vertex without intravenous contrast. COMPARISON:  12/29/2018 FINDINGS: Brain: Normal appearance without evidence of atrophy, old or acute infarction, mass lesion, hemorrhage, hydrocephalus or extra-axial collection. Vascular: Abnormal vascular finding. Skull: Normal Sinuses/Orbits: Clear/normal Other: None ASPECTS ([place] Stroke Program Early CT Score), applies to either hemisphere in this case. - Ganglionic level infarction (caudate, lentiform nuclei, internal capsule, insula, M1-M3 cortex): 7 - Supraganglionic infarction (M4-M6 cortex): 3 Total score (0-10 with 10 being normal): 10 IMPRESSION: 1. Normal head CT. 2. ASPECTS is 10, with respect to either hemisphere. 3. These results were communicated to Dr. Lorraine Lax at 9:00 pmon 8/4/2020by text page via the [facility] messaging system. Electronically Signed   By: Nelson Chimes M.D.   On: 06/02/2019 21:00    Disposition:   Discharge Instructions    Ambulatory  referral to Neurology   Complete by: As directed    An appointment is requested in approximately: 4 weeks   Call MD / Call 911   Complete by: As directed    If you experience chest pain or shortness of breath, CALL 911 and be transported to the hospital emergency room.  If you develope a fever above 101 F, pus (white drainage) or increased drainage or redness at the wound, or calf pain, call your surgeon's office.   Call MD / Call 911   Complete by: As directed    If you experience chest pain or shortness of breath, CALL 911 and be transported to the hospital emergency room.  If you develope a fever above 101 F, pus (white drainage)  or increased drainage or redness at the wound, or calf pain, call your surgeon's office.   Constipation Prevention   Complete by: As directed    Drink plenty of fluids.  Prune juice may be helpful.  You may use a stool softener, such as Colace (over the counter) 100 mg twice a day.  Use MiraLax (over the counter) for constipation as needed.   Constipation Prevention   Complete by: As directed    Drink plenty of fluids.  Prune juice may be helpful.  You may use a stool softener, such as Colace (over the counter) 100 mg twice a day.  Use MiraLax (over the counter) for constipation as needed.   Diet - low sodium heart healthy   Complete by: As directed    Discharge instructions   Complete by: As directed    Your dressings are waterproof so you may shower. Do not remove dressings until we remove them in the clinic.  Remain in the splint except when you are in the CPM machine.  Use the CPM machine 3 times per day for at least one hour each time.  F/u with Dr. August Saucerean in the clinic at your appointment date in ~2 weeks.  Do not lift with the operative arm (right arm).   Discharge instructions   Complete by: As directed    You may shower, dressings are waterproof.  Do not submerge the operative site in a bath or pool.  No lifting with the R arm.  F/u with Dr. August Saucerean in the clinic in 2 weeks at your appointment date.  Continue to use the sling, coming out 2-3 times per day to work on range of motion of the elbow (bending and straightening) as well as shoulder pendulum exercises.  Keep your fingers moving throughout the day to help with dependent swelling.   Increase activity slowly as tolerated   Complete by: As directed    Increase activity slowly as tolerated   Complete by: As directed          Signed: Julieanne Cottonharles L Tobey Schmelzle 06/05/2019, 4:36 PM

## 2019-06-10 ENCOUNTER — Ambulatory Visit (INDEPENDENT_AMBULATORY_CARE_PROVIDER_SITE_OTHER): Payer: 59 | Admitting: Orthopedic Surgery

## 2019-06-10 ENCOUNTER — Encounter: Payer: Self-pay | Admitting: Orthopedic Surgery

## 2019-06-10 DIAGNOSIS — M75121 Complete rotator cuff tear or rupture of right shoulder, not specified as traumatic: Secondary | ICD-10-CM

## 2019-06-12 ENCOUNTER — Encounter: Payer: Self-pay | Admitting: Orthopedic Surgery

## 2019-06-12 NOTE — Progress Notes (Signed)
Post-Op Visit Note   Patient: Darrell ShinesCharles B King           Date of Birth: [birth year]           MRN: [id number] Visit Date: 06/10/2019 PCP: Burna CashSantos-Sanchez, Idalys, MD   Assessment & Plan:  Chief Complaint:  Chief Complaint  Patient presents with  . Right Shoulder - Follow-up   Visit Diagnoses: No diagnosis found.  Plan: [name] is a patient is about a week out right shoulder distal clavicle excision of osteophyte and re-tear rotator cuff repair.  Feels better than after his first surgery.  Taking oxycodone only at night.  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  I think he is okay for sedentary work starting Monday for 4 weeks until I see him back.  Continue with CPM machine and shoulder range of motion but no strengthening yet.  His recurrent tear was small.  We can rehabilitate him fairly aggressively after his next clinic visit.  Passive range of motion today is actually pretty good.  Follow-Up Instructions: Return in about 4 weeks (around 07/08/2019).   Orders:  No orders of the defined types were placed in this encounter.  No orders of the defined types were placed in this encounter.   Imaging: No results found.  PMFS History: Patient Active Problem List   Diagnosis Date Noted  . Rotator cuff tear 06/02/2019  . S/P arthroscopy of right shoulder 06/02/2019  . Otitis externa 05/13/2019  . Syncope   . Chest pain 12/29/2018  . Other fatigue 11/11/2018  . Gout 12/31/2016  . Dyslipidemia 03/11/2016  . Essential hypertension, benign 02/23/2016  . 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 Numbness and tingling of right upper extremity 11/29/2014  . Patient is Jehovah's Witness 01/04/2014  . Health care maintenance 11/24/2013  . Left inguinal hernia 11/11/2013   Past Medical History:  Diagnosis Date  . Acute gout 12/31/2016  . Anxiety    "had taken Paxil; back in 1996; nothing since" (10/21/2013)  . Arthritis   . Asthma    asa child  . Complication of  anesthesia    SLOW TO WAKE UP AFTER SURGERY IN 2019  . Dyslipidemia 03/11/2016   12.5% 10-year risk of heart disease or stroke, should be on a moderate/high intensity statin.    . Essential hypertension, benign 02/23/2016  . Refusal of blood transfusions as patient is Jehovah's Witness     Family History  Problem Relation Age of Onset  . Colon cancer Mother        colon  . Cancer Sister        dont know what type    Past Surgical History:  Procedure Laterality Date  . COLONOSCOPY W/ POLYPECTOMY    . INGUINAL HERNIA REPAIR Right 1995  . INGUINAL HERNIA REPAIR Left 11/12/2013   Procedure: HERNIA REPAIR INGUINAL INCARCERATED;  Surgeon: Adolph Pollackodd J Rosenbower, MD;  [facility];  Service: General;  Laterality: Left;  . SHOULDER ARTHROSCOPY WITH OPEN ROTATOR CUFF REPAIR AND DISTAL CLAVICLE ACROMINECTOMY Right 06/02/2019   Procedure: right shoulder arthroscopy , mini open rotator cuff repair;  Surgeon: Cammy Copaean, Winnie Umali Scott, MD;  Location: [facility] OR;  Service: Orthopedics;  Laterality: Right;  . SHOULDER ARTHROSCOPY WITH SUBACROMIAL DECOMPRESSION, ROTATOR CUFF REPAIR AND BICEP TENDON REPAIR Right 07/14/2018   Procedure: RIGHT SHOULDER ARTHROSCOPY, DEBRIDEMENT, BICEPS TENODESIS, DISTAL CLAVICLE EXCISION AND MINI OPEN ROTATOR CUFF TEAR REPAIR;  Surgeon: Cammy Copaean, Toyia Jelinek Scott, MD;  Location: [facility] OR;  Service:  Orthopedics;  Laterality: Right;   Social History   Occupational History  . Occupation: Maintenance Tech  Tobacco Use  . Smoking status: Never Smoker  . Smokeless tobacco: Never Used  Substance and Sexual Activity  . Alcohol use: Yes    Alcohol/week: 1.0 - 2.0 standard drinks    Types: 1 - 2 Shots of liquor per week    Comment: occasionally.  . Drug use: No  . Sexual activity: Not on file

## 2019-06-14 ENCOUNTER — Other Ambulatory Visit: Payer: Self-pay

## 2019-06-14 ENCOUNTER — Ambulatory Visit (HOSPITAL_COMMUNITY)
Admission: EM | Admit: 2019-06-14 | Discharge: 2019-06-14 | Disposition: A | Discharge status: Home / Self Care | Payer: 59 | Attending: Emergency Medicine | Admitting: Emergency Medicine

## 2019-06-14 ENCOUNTER — Encounter (HOSPITAL_COMMUNITY): Payer: Self-pay

## 2019-06-14 DIAGNOSIS — M79671 Pain in right foot: Secondary | ICD-10-CM

## 2019-06-14 DIAGNOSIS — M109 Gout, unspecified: Secondary | ICD-10-CM | POA: Diagnosis not present

## 2019-06-14 MED ORDER — METHYLPREDNISOLONE SODIUM SUCC 125 MG IJ SOLR
INTRAMUSCULAR | Status: AC
  Filled 2019-06-14: qty 2

## 2019-06-14 MED ORDER — METHYLPREDNISOLONE SODIUM SUCC 125 MG IJ SOLR
125.0000 mg | Freq: Once | INTRAMUSCULAR | Status: AC
  Administered 2019-06-14: 125 mg via INTRAMUSCULAR

## 2019-06-14 MED ORDER — PREDNISONE 10 MG (21) PO TBPK: ORAL_TABLET | Freq: Every day | ORAL | 0 refills | Status: DC

## 2019-06-14 NOTE — ED Provider Notes (Signed)
[name]    CSN: [id number] Arrival date & time: 06/14/19  1129     History   Chief Complaint Chief Complaint  Patient presents with  . Foot Pain    Right    HPI Darrell King is a 53 y.o. male.   Patient presents with pain in his right foot x2 days.  He states it feels like he is having a gout flare which he has had in the past.  The pain started in his right great toe and has progressed to midfoot.  He denies injury or falls.  No treatments attempted at home as patient is currently on oxycodone, Robaxin, and ASA following shoulder surgery on 06/02/2019.  He denies numbness, paresthesias, weakness in his RLE.    The history is provided by the patient and the spouse.    Past Medical History:  Diagnosis Date  . Acute gout 12/31/2016  . Anxiety    "had taken Paxil; back in 1996; nothing since" (10/21/2013)  . Arthritis   . Asthma    asa child  . Complication of anesthesia    SLOW TO WAKE UP AFTER SURGERY IN 2019  . Dyslipidemia 03/11/2016   12.5% 10-year risk of heart disease or stroke, should be on a moderate/high intensity statin.    . Essential hypertension, benign 02/23/2016  . Refusal of blood transfusions as patient is Jehovah's Witness     Patient Active Problem List   Diagnosis Date Noted  . Rotator cuff tear 06/02/2019  . S/P arthroscopy of right shoulder 06/02/2019  . Otitis externa 05/13/2019  . Syncope   . Chest pain 12/29/2018  . Other fatigue 11/11/2018  . Gout 12/31/2016  . Dyslipidemia 03/11/2016  . Essential hypertension, benign 02/23/2016  . 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 Numbness and tingling of right upper extremity 11/29/2014  . Patient is Jehovah's Witness 01/04/2014  . Health care maintenance 11/24/2013  . Left inguinal hernia 11/11/2013    Past Surgical History:  Procedure Laterality Date  . COLONOSCOPY W/ POLYPECTOMY    . INGUINAL HERNIA REPAIR Right 1995  . INGUINAL HERNIA REPAIR Left 11/12/2013   Procedure: HERNIA REPAIR INGUINAL INCARCERATED;  Surgeon: Odis Hollingshead, MD;  Location: [facility];  Service: General;  Laterality: Left;  . SHOULDER ARTHROSCOPY WITH OPEN ROTATOR CUFF REPAIR AND DISTAL CLAVICLE ACROMINECTOMY Right 06/02/2019   Procedure: right shoulder arthroscopy , mini open rotator cuff repair;  Surgeon: Meredith Pel, MD;  Location: [facility];  Service: Orthopedics;  Laterality: Right;  . SHOULDER ARTHROSCOPY WITH SUBACROMIAL DECOMPRESSION, ROTATOR CUFF REPAIR AND BICEP TENDON REPAIR Right 07/14/2018   Procedure: RIGHT SHOULDER ARTHROSCOPY, DEBRIDEMENT, BICEPS TENODESIS, DISTAL CLAVICLE EXCISION AND MINI OPEN ROTATOR CUFF TEAR REPAIR;  Surgeon: Meredith Pel, MD;  Location: [facility];  Service: Orthopedics;  Laterality: Right;       Home Medications    Prior to Admission medications   Medication Sig Start Date End Date Taking? Authorizing Provider  amLODipine (NORVASC) 5 MG tablet Take 1 tablet (5 mg total) by mouth daily. Patient not taking: Reported on 05/29/2019 01/02/19   Lorella Nimrod, MD  aspirin 81 MG EC tablet Take 1 tablet (81 mg total) by mouth daily. 06/03/19   Magnant, Kiwan L, PA-C  atorvastatin (LIPITOR) 40 MG tablet Take 1 tablet (40 mg total) by mouth daily at 6 PM. Patient not taking: Reported on 05/29/2019 01/01/19   Lorella Nimrod, MD  ciprofloxacin-dexamethasone (CIPRODEX) OTIC suspension Place 4 drops into the  right ear 2 (two) times daily. Patient not taking: Reported on 05/29/2019 05/13/19   Elige Radonhristian, Rylee, MD  methocarbamol (ROBAXIN) 500 MG tablet Take 1 tablet (500 mg total) by mouth every 8 (eight) hours as needed for muscle spasms. 06/02/19   Magnant, Arshad L, PA-C  oxyCODONE (ROXICODONE) 5 MG immediate release tablet Take 1 tablet (5 mg total) by mouth every 6 (six) hours as needed. 06/02/19 06/01/20  Magnant, Jesusmanuel L, PA-C  predniSONE (STERAPRED UNI-PAK 21 TAB) 10 MG (21) TBPK tablet Take by mouth daily. Take 6 tabs by mouth daily  for 1 day, then 5  tabs for 1 day, then 4 tabs for 1 day, then 3 tabs for 1 day, 2 tabs for 1 day, then 1 tab by mouth daily for 1 day 06/15/19   Mickie Bailate, Robby Bulkley H, NP  traMADol (ULTRAM) 50 MG tablet Take 1 tablet (50 mg total) by mouth every 12 (twelve) hours as needed. Patient taking differently: Take 50 mg by mouth every 12 (twelve) hours as needed for moderate pain.  05/21/19   Magnant, Joycie Peekharles L, PA-C    Family History Family History  Problem Relation Age of Onset  . Colon cancer Mother        colon  . Cancer Sister        dont know what type    Social History Social History   Tobacco Use  . Smoking status: Never Smoker  . Smokeless tobacco: Never Used  Substance Use Topics  . Alcohol use: Yes    Alcohol/week: 1.0 - 2.0 standard drinks    Types: 1 - 2 Shots of liquor per week    Comment: occasionally.  . Drug use: No     Allergies   Patient has no known allergies.   Review of Systems Review of Systems  Constitutional: Negative for chills and fever.  HENT: Negative for ear pain and sore throat.   Eyes: Negative for pain and visual disturbance.  Respiratory: Negative for cough and shortness of breath.   Cardiovascular: Negative for chest pain and palpitations.  Gastrointestinal: Negative for abdominal pain and vomiting.  Genitourinary: Negative for dysuria and hematuria.  Musculoskeletal: Positive for arthralgias. Negative for back pain.  Skin: Negative for color change and rash.  Neurological: Negative for seizures and syncope.  All other systems reviewed and are negative.    Physical Exam Triage Vital Signs ED Triage Vitals  Enc Vitals Group     BP      Pulse      Resp      Temp      Temp src      SpO2      Weight      Height      Head Circumference      Peak Flow      Pain Score      Pain Loc      Pain Edu?      Excl. in GC?    No data found.  Updated Vital Signs BP 119/85 (BP Location: Right Arm)   Pulse 88   Temp 98.1 F (36.7 C) (Oral)   Resp 20   SpO2 94%    Visual Acuity Right Eye Distance:   Left Eye Distance:   Bilateral Distance:    Right Eye Near:   Left Eye Near:    Bilateral Near:     Physical Exam Vitals signs and nursing note reviewed.  Constitutional:      Appearance: He is well-developed.  HENT:     Head: Normocephalic and atraumatic.  Eyes:     Conjunctiva/sclera: Conjunctivae normal.  Neck:     Musculoskeletal: Neck supple.  Cardiovascular:     Rate and Rhythm: Normal rate and regular rhythm.     Heart sounds: No murmur.  Pulmonary:     Effort: Pulmonary effort is normal. No respiratory distress.     Breath sounds: Normal breath sounds.  Abdominal:     Palpations: Abdomen is soft.     Tenderness: There is no abdominal tenderness.  Musculoskeletal:        General: Swelling and tenderness present. No deformity.     Comments: Right great toe to mid foot tender with faint erythema and mild edema.    Skin:    General: Skin is warm and dry.     Findings: Erythema present.  Neurological:     General: No focal deficit present.     Mental Status: He is alert.     Sensory: No sensory deficit.     Motor: No weakness.      UC Treatments / Results  Labs (all labs ordered are listed, but only abnormal results are displayed) Labs Reviewed - No data to display  EKG   Radiology No results found.  Procedures Procedures (including critical care time)  Medications Ordered in UC Medications  methylPREDNISolone sodium succinate (SOLU-MEDROL) 125 mg/2 mL injection 125 mg (125 mg Intramuscular Given 06/14/19 1316)  methylPREDNISolone sodium succinate (SOLU-MEDROL) 125 mg/2 mL injection (has no administration in time range)    Initial Impression / Assessment and Plan / UC Course  I have reviewed the triage vital signs and the nursing notes.  Pertinent labs & imaging results that were available during my care of the patient were reviewed by me and considered in my medical decision making (see chart for details).     Right foot gout, right foot pain.  Patient declines x-ray as he says he has had no injury or fall.  Treating with Solu-Medrol, then prednisone starting tomorrow morning.  Instructed patient to return here or to his PCP if he has continued or worsening pain; if he develops other symptoms such as fever, chills, paresthesias, weakness, numbness.     Final Clinical Impressions(s) / UC Diagnoses   Final diagnoses:  Acute gout of right foot, unspecified cause  Foot pain, right     Discharge Instructions     You were given a shot of a steroid called Solu-Medrol today.  Start the prednisone as prescribed tomorrow morning.    Return here or follow-up with your primary care provider if your pain worsens or if you develop other symptoms such as fever, chills, numbness, tingling, weakness.        ED Prescriptions    Medication Sig Dispense Auth. Provider   predniSONE (STERAPRED UNI-PAK 21 TAB) 10 MG (21) TBPK tablet Take by mouth daily. Take 6 tabs by mouth daily  for 1 day, then 5 tabs for 1 day, then 4 tabs for 1 day, then 3 tabs for 1 day, 2 tabs for 1 day, then 1 tab by mouth daily for 1 day 21 tablet Mickie Bailate, Zakari Bathe H, NP     Controlled Substance Prescriptions [place] Controlled Substance Registry consulted? Not Applicable   Mickie Bailate, Leroi Haque H, NP 06/14/19 1322

## 2019-06-14 NOTE — Discharge Instructions (Signed)
You were given a shot of a steroid called Solu-Medrol today.  Start the prednisone as prescribed tomorrow morning.    Return here or follow-up with your primary care provider if your pain worsens or if you develop other symptoms such as fever, chills, numbness, tingling, weakness.

## 2019-06-14 NOTE — ED Triage Notes (Signed)
Pt presents with right foot pain X 7 days with no relief.  Pt has Hx of Gout

## 2019-06-18 ENCOUNTER — Other Ambulatory Visit: Payer: Self-pay

## 2019-06-18 NOTE — Telephone Encounter (Signed)
Can you please submit electronic refill?

## 2019-06-18 NOTE — Telephone Encounter (Signed)
y

## 2019-06-18 NOTE — Telephone Encounter (Signed)
This is a GD pt 

## 2019-06-18 NOTE — Telephone Encounter (Signed)
Ok to rf? 

## 2019-06-19 DIAGNOSIS — M19011 Primary osteoarthritis, right shoulder: Secondary | ICD-10-CM

## 2019-06-19 MED ORDER — OXYCODONE HCL 5 MG PO TABS: 5.0000 mg | ORAL_TABLET | Freq: Four times a day (QID) | ORAL | 0 refills | Status: DC | PRN

## 2019-07-03 NOTE — Addendum Note (Signed)
Addended by: Orson Gear on: 07/03/2019 10:58 AM   Modules accepted: Orders

## 2019-07-29 ENCOUNTER — Ambulatory Visit: Payer: 59 | Admitting: Neurology

## 2019-07-29 ENCOUNTER — Telehealth: Payer: Self-pay | Admitting: Orthopedic Surgery

## 2019-07-29 NOTE — Telephone Encounter (Signed)
Okay for no overhead lifting more than 15 pounds.  For the next 6 weeks.  Floor to waist lifting not to exceed 30 pounds for the next 6 weeks

## 2019-07-29 NOTE — Telephone Encounter (Signed)
Please advise. Thanks.  

## 2019-07-29 NOTE — Telephone Encounter (Signed)
IC advised could access through mychart 

## 2019-07-29 NOTE — Telephone Encounter (Signed)
Patient called stating that he is suppose to return to work on Monday, but his ROM is not 100%.  He is requesting a work note that lists his restrictions as far as how much he can lift, etc.  He requests that the note be put on his MyChart or emailed to him.  CB#[phone].  Thank you.

## 2019-07-30 ENCOUNTER — Other Ambulatory Visit: Payer: Self-pay

## 2019-07-30 MED ORDER — OXYCODONE HCL 5 MG PO TABS: 5.0000 mg | ORAL_TABLET | Freq: Every day | ORAL | 0 refills | Status: AC | PRN

## 2019-07-30 NOTE — Telephone Encounter (Signed)
Please advise 

## 2019-08-10 IMAGING — XA FLUORO GUIDED NEEDLE PLACEMENT AND/OR ASPIRATION
1 series · 1 of 1 positions shown · non-contrast
Comparison: none

CLINICAL DATA: Chronic right shoulder pain. Prior surgery in
June 2018 with continued pain. Concern for recurrent rotator
cuff tear.

[Series 1: ortho standard · 1 of 1 slices shown]
[im 1/1]
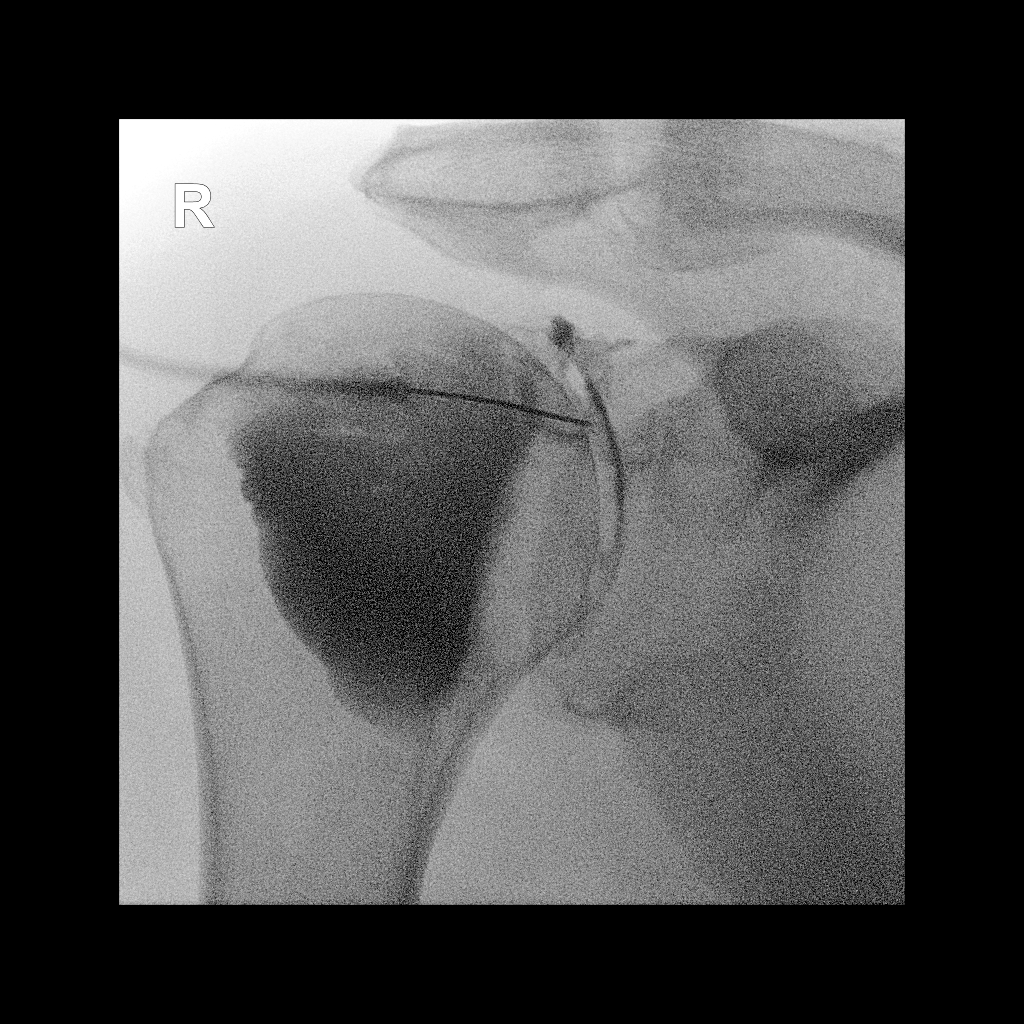

[1 of 1 positions shown; findings below may reference images not displayed]

FLUOROSCOPY TIME:  Radiation Exposure Index (as provided by the
fluoroscopic device): 0.5 mGy

Fluoroscopy Time:  3 seconds

Number of Acquired Images:  0

PROCEDURE:
The risks and benefits of the procedure were discussed with the
patient, and written informed consent was obtained. The patient
stated no history of allergy to contrast media. A formal timeout
procedure was performed with the patient according to departmental
protocol.

The patient was placed supine on the fluoroscopy table and the right
glenohumeral joint was identified under fluoroscopy. The skin
overlying the right glenohumeral joint was subsequently cleaned with
Betadine and a sterile drape was placed over the area of interest. 2
ml 1% Lidocaine was used to anesthetize the skin around the needle
insertion site.

A 22 gauge spinal needle was inserted into the right glenohumeral
joint under fluoroscopy.

12 ml of gadolinium mixture (0.1 ml of Multihance mixed with 10 ml
of Isovue-M 200 contrast and 10 ml of sterile saline) were injected
into the right glenohumeral joint.

The needle was removed and hemostasis was achieved. The patient was
subsequently transferred to MRI for imaging.
IMPRESSION: Technically successful right shoulder injection for MRI.

## 2019-09-21 ENCOUNTER — Encounter: Payer: 59 | Admitting: Internal Medicine

## 2019-09-21 ENCOUNTER — Other Ambulatory Visit: Payer: Self-pay | Admitting: Internal Medicine

## 2019-09-21 ENCOUNTER — Other Ambulatory Visit: Payer: Self-pay

## 2019-09-21 ENCOUNTER — Encounter: Payer: Self-pay | Admitting: Internal Medicine

## 2019-09-21 MED ORDER — INDOMETHACIN 50 MG PO CAPS: 50.0000 mg | ORAL_CAPSULE | Freq: Three times a day (TID) | ORAL | 0 refills | Status: DC

## 2019-09-21 NOTE — Telephone Encounter (Signed)
Darrell King

## 2019-09-21 NOTE — Telephone Encounter (Signed)
Called pt to remind him that [facility] is not open on weekends and will close at 1200 wed for Thanksgiving, he states he cannot get off work because he just started a new job and he is getting in bad shape, advised him that if he continues he would need to go to urgent care this weekend, he states he would like dr to reconsider and prescribe the prednisone so he doesn't have to go to [place] care. He states its expensive, you may call him if needed

## 2019-09-21 NOTE — Telephone Encounter (Signed)
Ok to rf? 

## 2019-09-21 NOTE — Telephone Encounter (Signed)
Please advise. Thanks.  

## 2019-09-22 ENCOUNTER — Encounter: Payer: Self-pay | Admitting: Internal Medicine

## 2019-09-22 ENCOUNTER — Other Ambulatory Visit: Payer: Self-pay

## 2019-09-22 ENCOUNTER — Ambulatory Visit (INDEPENDENT_AMBULATORY_CARE_PROVIDER_SITE_OTHER): Payer: 59 | Admitting: Internal Medicine

## 2019-09-22 VITALS — BP 138/95 | HR 72 | Temp 98.2°F | Ht >= 80 in | Wt 297.2 lb

## 2019-09-22 DIAGNOSIS — L608 Other nail disorders: Secondary | ICD-10-CM | POA: Diagnosis not present

## 2019-09-22 DIAGNOSIS — I10 Essential (primary) hypertension: Secondary | ICD-10-CM

## 2019-09-22 DIAGNOSIS — Z79899 Other long term (current) drug therapy: Secondary | ICD-10-CM

## 2019-09-22 DIAGNOSIS — M109 Gout, unspecified: Secondary | ICD-10-CM | POA: Diagnosis not present

## 2019-09-22 DIAGNOSIS — M10071 Idiopathic gout, right ankle and foot: Secondary | ICD-10-CM

## 2019-09-22 MED ORDER — PREDNISONE 10 MG (21) PO TBPK: ORAL_TABLET | Freq: Every day | ORAL | 0 refills | Status: DC

## 2019-09-22 MED ORDER — AMLODIPINE BESYLATE 5 MG PO TABS: 5.0000 mg | ORAL_TABLET | Freq: Every day | ORAL | 3 refills | Status: DC

## 2019-09-22 NOTE — Assessment & Plan Note (Deleted)
Present on multiple digits. Will continue to monitor

## 2019-09-22 NOTE — Patient Instructions (Signed)
You were seen for pain in your foot which is consistent with gout. We have sent a prescription for prednisone to your pharmacy. We have also sent a refill on your blood pressure medication, amlodipine.  We have attached instructions on foods to avoid to prevent future gout attacks. If you have another one soon, we might consider starting a medication to help prevent them.  Thank you for allowing [place] to be part of your medical care!

## 2019-09-22 NOTE — Assessment & Plan Note (Signed)
Pain in right foot. Patient with following diagnostic criteria for gout: male sex, previous gout attack, involvement of first metatarsal phalangeal joint, and hypertension. No systemic signs of infection. Given these factors and typical presentation on physical exam, patient is likely having a gout attack.  * Prescription for prednisone taper sent to pharmacy * Handout on gout diet provided to patient * If patient has 3rd gout attack within the year, patient may benefit from allopurinol therapy. Outside this attack, patient's uric acid levels could also be measured to aid in decision

## 2019-09-22 NOTE — Assessment & Plan Note (Signed)
BP Readings from Last 3 Encounters:  09/22/19 (!) 138/95  06/14/19 119/85  06/03/19 (!) 145/84   Blood pressure elevated to 138/95. Was previously on amlodipine 5 mg. Patient has not been taking medication for several months. Refill sent.

## 2019-09-22 NOTE — Progress Notes (Signed)
   CC: Foot pain  HPI: Patient is a 53 year old male with past medical history as below who presents for 2-day history of right foot pain.  [name] is a 53 y.o.   Past Medical History:  Diagnosis Date  . Acute gout 12/31/2016  . Anxiety    "had taken Paxil; back in 1996; nothing since" (10/21/2013)  . Arthritis   . Asthma    asa child  . Complication of anesthesia    SLOW TO WAKE UP AFTER SURGERY IN 2019  . Dyslipidemia 03/11/2016   12.5% 10-year risk of heart disease or stroke, should be on a moderate/high intensity statin.    . Essential hypertension, benign 02/23/2016  . Refusal of blood transfusions as patient is Jehovah's Witness    Review of Systems:   Review of Systems  Constitutional: Negative for chills and fever.  HENT: Negative for congestion.   Respiratory: Negative for cough and shortness of breath.   Cardiovascular: Negative for chest pain.  Gastrointestinal: Negative for abdominal pain, constipation, diarrhea, nausea and vomiting.  All other systems reviewed and are negative.   Physical Exam:  Vitals:   09/22/19 1003 09/22/19 1004  BP:  (!) 138/95  Pulse:  72  Temp:  98.2 F (36.8 C)  TempSrc:  Oral  SpO2:  99%  Weight: 297 lb 3.2 oz (134.8 kg)   Height: 6\' 9"  (2.057 m)    Physical Exam  Constitutional: He is well-developed, well-nourished, and in no distress.  HENT:  Head: Normocephalic and atraumatic.  Eyes: EOM are normal. Right eye exhibits no discharge. Left eye exhibits no discharge.  Neck: Normal range of motion. No tracheal deviation present.  Cardiovascular: Normal rate and regular rhythm. Exam reveals no gallop and no friction rub.  No murmur heard. Pulmonary/Chest: Effort normal and breath sounds normal. No respiratory distress. He has no wheezes. He has no rales.  Abdominal: Soft. He exhibits no distension. There is no abdominal tenderness. There is no rebound and no guarding.  Musculoskeletal: Normal range of motion.      General: Tenderness present. No deformity or edema.     Comments: Warmth, tenderness, localized swelling present on right medial aspect of foot  Neurological: He is alert. Coordination normal.  Skin: Skin is warm and dry. No rash noted. He is not diaphoretic. No erythema.  Melanonychia present on digits  Psychiatric: Memory and judgment normal.    Assessment & Plan:   See Encounters Tab for problem based charting.  Patient seen and discussed with Dr. Daryll Drown

## 2019-09-22 NOTE — Assessment & Plan Note (Addendum)
Melanonychia on multiple digits. Per patient discoloration has been present and stable since he was a kid. Patient instructed to notify us of any progression. Will continue to monitor for any concerning features.

## 2019-09-28 NOTE — Progress Notes (Signed)
Internal Medicine Clinic Attending  I saw and evaluated the patient.  I personally confirmed the key portions of the history and exam documented by Dr. MacLean and I reviewed pertinent patient test results.  The assessment, diagnosis, and plan were formulated together and I agree with the documentation in the resident's note.  

## 2019-10-01 ENCOUNTER — Encounter: Payer: Self-pay | Admitting: Internal Medicine

## 2019-10-02 ENCOUNTER — Other Ambulatory Visit: Payer: Self-pay | Admitting: Internal Medicine

## 2019-10-02 MED ORDER — PREDNISONE 10 MG PO TABS: ORAL_TABLET | ORAL | 0 refills | Status: DC

## 2019-10-02 NOTE — Progress Notes (Signed)
Spoke with patient on phone.  Patient had good control of gout symptoms while on steroid therapy but symptoms returned after completion of 6 day taper.    Given presence of his rebound flare, will treat with a 16-day steroid taper:  4 tablets (40mg ) for 4 days followed by 3 tablets (30mg ) for 4 days, then 2 tablets (20mg ) for 4 days, then 1 tablet (10mg ) for 4 days  Patient instructed on risk for adrenal insufficiency and counseled on symptoms to monitor for.  Patient instructed to notify us if the symptoms occur.  Jeanmarie Hubert, MD 10/02/2019, 2:58 PM Pager: [phone]

## 2019-11-24 ENCOUNTER — Telehealth: Payer: Self-pay

## 2019-11-24 NOTE — Telephone Encounter (Signed)
Patient had called and left a VM stating that he is having pain in his right shoulder that is radiating up/down his right arm.  Tried calling patient back, but no answer, left a VM for him to call the office back.

## 2019-12-01 ENCOUNTER — Telehealth: Payer: Self-pay | Admitting: Orthopedic Surgery

## 2019-12-01 NOTE — Telephone Encounter (Signed)
Pls advise. Thanks.  

## 2019-12-01 NOTE — Telephone Encounter (Signed)
[name] wife called.   He had surgery early last year and is experiencing pain in the area. They need advisement on their next course of action.   Call back number: [phone]

## 2019-12-02 NOTE — Telephone Encounter (Signed)
He should come in tomorrow

## 2019-12-02 NOTE — Telephone Encounter (Signed)
IC offered an appt She said that they were unable to schedule at this time because he went back to work today. They will call back and schedule an appt for patient to see Dr August Saucer when they figure out best time is for them.

## 2020-02-23 ENCOUNTER — Encounter: Payer: Self-pay | Admitting: *Deleted

## 2020-12-28 ENCOUNTER — Telehealth: Payer: Self-pay

## 2020-12-28 NOTE — Telephone Encounter (Signed)
Agree with Darrell King that his marked elevated BP should be evaluated in the ED.

## 2020-12-28 NOTE — Telephone Encounter (Signed)
Agree with urgent evaluation in ED as you've recommended

## 2020-12-28 NOTE — Telephone Encounter (Signed)
Received TC from patient's wife who states patient was sent home from work last night because his b/p was 170/120.  Wife states patient has been on the couch since last night and doesn't want to go to the ED.  Wife rechecked his b/p and reports b/p of 225/148, pt now c/o "not feeling well, dizziness, racing pulse, chest pain, and wife states he is clammy". RN instructed wife to call 911 immediately for evaluation and ED transport.  Informed wife with above symptoms patient must be evaluated in ED emergently.  Wife verbalized understanding and agreement. SChaplin, RN,BSN

## 2021-02-02 ENCOUNTER — Ambulatory Visit: Payer: No Typology Code available for payment source | Admitting: Student

## 2021-02-02 ENCOUNTER — Other Ambulatory Visit: Payer: Self-pay

## 2021-02-02 ENCOUNTER — Ambulatory Visit (HOSPITAL_COMMUNITY)
Admission: RE | Admit: 2021-02-02 | Discharge: 2021-02-02 | Disposition: A | Discharge status: Home / Self Care | Payer: No Typology Code available for payment source | Source: Ambulatory Visit | Attending: Student in an Organized Health Care Education/Training Program | Admitting: Student in an Organized Health Care Education/Training Program

## 2021-02-02 ENCOUNTER — Encounter: Payer: Self-pay | Admitting: Student

## 2021-02-02 VITALS — BP 136/84 | HR 80 | Temp 97.5°F | Ht 79.0 in | Wt 300.3 lb

## 2021-02-02 DIAGNOSIS — Z6833 Body mass index (BMI) 33.0-33.9, adult: Secondary | ICD-10-CM

## 2021-02-02 DIAGNOSIS — E876 Hypokalemia: Secondary | ICD-10-CM

## 2021-02-02 DIAGNOSIS — G8929 Other chronic pain: Secondary | ICD-10-CM | POA: Insufficient documentation

## 2021-02-02 DIAGNOSIS — R202 Paresthesia of skin: Secondary | ICD-10-CM

## 2021-02-02 DIAGNOSIS — Z1159 Encounter for screening for other viral diseases: Secondary | ICD-10-CM

## 2021-02-02 DIAGNOSIS — M1A9XX0 Chronic gout, unspecified, without tophus (tophi): Secondary | ICD-10-CM | POA: Diagnosis not present

## 2021-02-02 DIAGNOSIS — I10 Essential (primary) hypertension: Secondary | ICD-10-CM

## 2021-02-02 DIAGNOSIS — E785 Hyperlipidemia, unspecified: Secondary | ICD-10-CM

## 2021-02-02 DIAGNOSIS — M67911 Unspecified disorder of synovium and tendon, right shoulder: Secondary | ICD-10-CM

## 2021-02-02 DIAGNOSIS — R200 Anesthesia of skin: Secondary | ICD-10-CM

## 2021-02-02 DIAGNOSIS — M25511 Pain in right shoulder: Secondary | ICD-10-CM | POA: Insufficient documentation

## 2021-02-02 DIAGNOSIS — E669 Obesity, unspecified: Secondary | ICD-10-CM

## 2021-02-02 DIAGNOSIS — M19011 Primary osteoarthritis, right shoulder: Secondary | ICD-10-CM

## 2021-02-02 HISTORY — DX: Encounter for screening for other viral diseases: Z11.59

## 2021-02-02 LAB — POCT GLYCOSYLATED HEMOGLOBIN (HGB A1C): Hemoglobin A1C: 5.4 % (ref 4.0–5.6)

## 2021-02-02 LAB — GLUCOSE, CAPILLARY: Glucose-Capillary: 79 mg/dL (ref 70–99)

## 2021-02-02 MED ORDER — AMLODIPINE BESYLATE 5 MG PO TABS: 5.0000 mg | ORAL_TABLET | Freq: Every day | ORAL | 3 refills | Status: DC

## 2021-02-02 NOTE — Assessment & Plan Note (Signed)
Patient is obese with BMI ~34.   - Will check Hgb A1c for DM screening  - Consider TSH at future visit if symptoms are suggestive of this (w/ CTS noted per chart review)

## 2021-02-02 NOTE — Assessment & Plan Note (Signed)
Patient endorses a history of gout for which he has been prescribed a prednisone taper in the past, although hasn't formally been diagnosed histologically. Based on ACR/EULAR guidelines, patient's symptoms are likely consistent with gout; however, will require uric acid level assessment to be more certain.   - Check serum uric acid level  - If above > 6, will start daily allopurinol + 1 month colchicine treatment for target of uric acid < 6 considering disease is non-tophaceous

## 2021-02-02 NOTE — Assessment & Plan Note (Addendum)
Blood pressure today is slightly elevated although patient has been out of amlodipine 5mg  for several months.   - Will refill amlodipine 5mg  daily  - Will consider switching to ARB if Hgb A1c is consistent with DM  - Check BMP

## 2021-02-02 NOTE — Assessment & Plan Note (Signed)
Chronic Right Shoulder Pain:  [name] persistent pain since arthroscopy for open rotator cuff repair in 2020 with significant tenderness, moderate swelling, and limited abduction with positive empty can test are concerning for supraspinatus impingement and possible recurrent tear in right rotator cuff musculature. 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   - Check XR of right shoulder  - Continue intermittent OTC analgesics as needed for pain  - Will likely require orthopedic surgery follow up given significant functional limitation

## 2021-02-02 NOTE — Patient Instructions (Signed)
Darrell King,   It was a pleasure meeting you today! We will check a shoulder x-ray to rule out signs of impingement / rotator cuff tear, and worsening arthritis. We will also check routine blood work including uric acid levels to assess severity of your gout as well as your average blood sugars and cholesterol.   I will call you with results as soon as they are available and we will discuss next steps.   In the meantime, you can speak with the front desk or call [phone] to schedule a follow up visit in 3 months or sooner if you hear otherwise upon our call to discuss your results. Please don't hesitate to call if you have questions or concerns in the meantime.   Take care,  Dr. Laddie Aquas    Gout  Gout is a condition that causes painful swelling of the joints. Gout is a type of inflammation of the joints (arthritis). This condition is caused by having too much uric acid in the body. Uric acid is a chemical that forms when the body breaks down substances called purines. Purines are important for building body proteins. When the body has too much uric acid, sharp crystals can form and build up inside the joints. This causes pain and swelling. Gout attacks can happen quickly and may be very painful (acute gout). Over time, the attacks can affect more joints and become more frequent (chronic gout). Gout can also cause uric acid to build up under the skin and inside the kidneys. What are the causes? This condition is caused by too much uric acid in your blood. This can happen because:  Your kidneys do not remove enough uric acid from your blood. This is the most common cause.  Your body makes too much uric acid. This can happen with some cancers and cancer treatments. It can also occur if your body is breaking down too many red blood cells (hemolytic anemia).  You eat too many foods that are high in purines. These foods include organ meats and some seafood. Alcohol, especially beer, is also  high in purines. A gout attack may be triggered by trauma or stress. What increases the risk? You are more likely to develop this condition if you:  Have a family history of gout.  Are male and middle-aged.  Are male and have gone through menopause.  Are obese.  Frequently drink alcohol, especially beer.  Are dehydrated.  Lose weight too quickly.  Have an organ transplant.  Have lead poisoning.  Take certain medicines, including aspirin, cyclosporine, diuretics, levodopa, and niacin.  Have kidney disease.  Have a skin condition called psoriasis. What are the signs or symptoms? An attack of acute gout happens quickly. It usually occurs in just one joint. The most common place is the big toe. Attacks often start at night. Other joints that may be affected include joints of the feet, ankle, knee, fingers, wrist, or elbow. Symptoms of this condition may include:  Severe pain.  Warmth.  Swelling.  Stiffness.  Tenderness. The affected joint may be very painful to touch.  Shiny, red, or purple skin.  Chills and fever. Chronic gout may cause symptoms more frequently. More joints may be involved. You may also have white or yellow lumps (tophi) on your hands or feet or in other areas near your joints.   How is this diagnosed? This condition is diagnosed based on your symptoms, medical history, and physical exam. You may have tests, such as:  Blood tests  to measure uric acid levels.  Removal of joint fluid with a thin needle (aspiration) to look for uric acid crystals.  X-rays to look for joint damage. How is this treated? Treatment for this condition has two phases: treating an acute attack and preventing future attacks. Acute gout treatment may include medicines to reduce pain and swelling, including:  NSAIDs.  Steroids. These are strong anti-inflammatory medicines that can be taken by mouth (orally) or injected into a joint.  Colchicine. This medicine relieves  pain and swelling when it is taken soon after an attack. It can be given by mouth or through an IV. Preventive treatment may include:  Daily use of smaller doses of NSAIDs or colchicine.  Use of a medicine that reduces uric acid levels in your blood.  Changes to your diet. You may need to see a dietitian about what to eat and drink to prevent gout. Follow these instructions at home: During a gout attack  If directed, put ice on the affected area: ? Put ice in a plastic bag. ? Place a towel between your skin and the bag. ? Leave the ice on for 20 minutes, 2-3 times a day.  Raise (elevate) the affected joint above the level of your heart as often as possible.  Rest the joint as much as possible. If the affected joint is in your leg, you may be given crutches to use.  Follow instructions from your health care provider about eating or drinking restrictions.   Avoiding future gout attacks  Follow a low-purine diet as told by your dietitian or health care provider. Avoid foods and drinks that are high in purines, including liver, kidney, anchovies, asparagus, herring, mushrooms, mussels, and beer.  Maintain a healthy weight or lose weight if you are overweight. If you want to lose weight, talk with your health care provider. It is important that you do not lose weight too quickly.  Start or maintain an exercise program as told by your health care provider. Eating and drinking  Drink enough fluids to keep your urine pale yellow.  If you drink alcohol: ? Limit how much you use to:  0-1 drink a day for women.  0-2 drinks a day for men. ? Be aware of how much alcohol is in your drink. In the [place] one drink equals one 12 oz bottle of beer (355 mL) one 5 oz glass of wine (148 mL), or one 1 oz glass of hard liquor (44 mL). General instructions  Take over-the-counter and prescription medicines only as told by your health care provider.  Do not drive or use heavy machinery while taking  prescription pain medicine.  Return to your normal activities as told by your health care provider. Ask your health care provider what activities are safe for you.  Keep all follow-up visits as told by your health care provider. This is important. Contact a health care provider if you have:  Another gout attack.  Continuing symptoms of a gout attack after 10 days of treatment.  Side effects from your medicines.  Chills or a fever.  Burning pain when you urinate.  Pain in your lower back or belly. Get help right away if you:  Have severe or uncontrolled pain.  Cannot urinate. Summary  Gout is painful swelling of the joints caused by inflammation.  The most common site of pain is the big toe, but it can affect other joints in the body.  Medicines and dietary changes can help to prevent and treat  gout attacks. This information is not intended to replace advice given to you by your health care provider. Make sure you discuss any questions you have with your health care provider. Document Revised: 05/07/2018 Document Reviewed: 05/07/2018 Elsevier Patient Education  2021 [employer].

## 2021-02-02 NOTE — Assessment & Plan Note (Signed)
Patient has been noted in the past by orthopedic surgery to have severe AC arthritis and exam findings are consistent with this today.   - Check XR right shoulder (see above)

## 2021-02-02 NOTE — Assessment & Plan Note (Signed)
-   Will check hepatitis C antibody today for one-time HCV screening

## 2021-02-02 NOTE — Assessment & Plan Note (Signed)
Last lipid profile obtained in 2020 showed elevated LDL to 139 with high 10-year ASCVD risk of 13.9%. He had been prescribed Atorvastatin 40mg  daily in the past although stopped taking this after 3 days, not due to side effects but rather strong preference not to take daily medications.   - Educated him on the importance of optimizing risk factors for future heart disease and stroke - Agrees to repeat lipid profile today  - Will likely require daily moderate intensity statin, pending lab results

## 2021-02-02 NOTE — Progress Notes (Signed)
CC: Right Shoulder and Right Foot Pain   HPI:  Darrell King is a 55 y.o. 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 He states he has a history of gout that typically flares in his right ankle almost every weekend. He believes this in the setting of dietary indiscretion as he consumed red meat and alcohol more heavily on the weekends. He has difficulty bearing weight on his right foot during acute flares although states his pain is currently only mild, without history of bony growths or impaired range of motion. He notes he had been on a medication starting with a "-p" for this in the past (possibly prednisone) but is not taking anything OTC for this. He also has had ongoing right shoulder pain since about 4 months after his open shoulder arthroscopy / tendon repair back in 2020. He feels a painful lump at his surgical incision site and has significant troubles lifting his right arm over head. He also notes posterior neck pain with intermittent shooting sensations down his posterior right arm into his 3rd to 5th digits on the right associated with mild weakness and decreased sensation. He feels his symptoms are similar to cervical radiculopathy he was treated for in the past. He has not had relief of his pain with Aleve. He notes significant anxiety in visiting doctors and a history of poor medication non-compliance as he prefers not to take daily medications; however, he and his wife decided that with new insurance it was time to return to the doctors'. Denies CP, SOB, fevers, chills, or any other complaints.   Past Medical History:  Diagnosis Date  . Acute gout 12/31/2016  . Anxiety    "had taken Paxil; back in 1996; nothing since" (10/21/2013)  . Arthritis   . Asthma    asa child  . Complication  of anesthesia    SLOW TO WAKE UP AFTER SURGERY IN 2019  . Dyslipidemia 03/11/2016   12.5% 10-year risk of heart disease or stroke, should be on a moderate/high intensity statin.    . Essential hypertension, benign 02/23/2016  . Refusal of blood transfusions as patient is Jehovah's Witness    Social Hx:  Patient lives at home with his wife. He works in Production designer, theatre/television/film (very physically demanding job).  He has never smoked tobacco products.  He does drink 2-3 liquor-containing beverages daily on the weekends but denies week day drinking or history of more heavy alcohol use.  Denies any illicit or non-prescribed drug use.   Review of Systems:  All others negative except as noted above in HPI.   Physical Exam:  Vitals:   02/02/21 1436 02/02/21 1444  BP: (!) 141/85 136/84  Pulse: 80   Temp: (!) 97.5 F (36.4 C)   TempSrc: Oral   SpO2: 98%   Weight: (!) 300 lb 4.8 oz (136.2 kg)   Height: 6\' 7"  (2.007 m)    General: Patient is obese. He appears well. No acute distress. Eyes: Sclera non-icteric. No conjunctival injection.  HENT: MMM. No nasal discharge. Respiratory: Lungs are CTA, bilaterally. No wheezes, rales, or rhonchi.  Cardiovascular: Regular rate and rhythm. No murmurs, rubs, or gallops. No lower extremity edema. Musculoskeletal: There is significant tenderness to palpation just anterior to the acromioclavicular joint with swelling but no obvious joint effusion. Patient is unable to passively or  actively abduct his right arm past 90 degrees (limited secondary to pain). He has decreased (4+/5) strength throughout RUE at the elbow compared to the LUE (5/5). There is moderate tenderness to palpation with minimal swelling and no obvious effusion just anteroinferior to the medial malleolus of the RLE. Patient has full ROM of the right ankle with evidence of tophi. Neurological: There is decreased sensation to light touch along the ulnar dermatome of the RUE. Sensation is intact to light touch  throughout his LUE.  Skin: There are multiple healed scars over the right shoulder. No acute lesions or rashes noted. No erythema.  Psych: Mildly anxious. Pleasant. Normal tone of voice.   Assessment & Plan:   See Encounters Tab for problem based charting.  Patient discussed with Dr. Oswaldo Done.  Glenford Bayley, MD 02/02/2021, 8:43 PM Pager: [phone]

## 2021-02-02 NOTE — Assessment & Plan Note (Signed)
Patient endorses a history of cervical radiculopathy and has paracervical muscle tenderness and tightness, decreased strength, and diminished sensation along the C8/T1 nerve root distribution. Per previous assessment, he also had + Tinel and Phalen testing consistent with CTS although this was not assessed today.   - Findings above consistent with possible cervical radiculopathy with possible biceps tendinopathy as noted previously - Continue to monitor with OTC analgesics for now - Will likely require referral to orthopedic surgery for further evaluation given, especially given financial concerns regarding ongoing PT

## 2021-02-03 ENCOUNTER — Other Ambulatory Visit: Payer: Self-pay | Admitting: Surgical

## 2021-02-03 LAB — BMP8+ANION GAP
Anion Gap: 18 mmol/L (ref 10.0–18.0)
BUN/Creatinine Ratio: 11 (ref 9–20)
BUN: 13 mg/dL (ref 6–24)
CO2: 22 mmol/L (ref 20–29)
Calcium: 9.6 mg/dL (ref 8.7–10.2)
Chloride: 103 mmol/L (ref 96–106)
Creatinine, Ser: 1.14 mg/dL (ref 0.76–1.27)
Glucose: 73 mg/dL (ref 65–99)
Potassium: 4.3 mmol/L (ref 3.5–5.2)
Sodium: 143 mmol/L (ref 134–144)
eGFR: 77 mL/min/{1.73_m2} (ref >59)

## 2021-02-03 LAB — LIPID PANEL
Chol/HDL Ratio: 7.8 ratio — ABNORMAL HIGH (ref 0.0–5.0)
Cholesterol, Total: 227 mg/dL — ABNORMAL HIGH (ref 100–199)
HDL: 29 mg/dL — ABNORMAL LOW (ref >39)
LDL Chol Calc (NIH): 106 mg/dL — ABNORMAL HIGH (ref 0–99)
Triglycerides: 531 mg/dL — ABNORMAL HIGH (ref 0–149)
VLDL Cholesterol Cal: 92 mg/dL — ABNORMAL HIGH (ref 5–40)

## 2021-02-03 LAB — URIC ACID: Uric Acid: 6.7 mg/dL (ref 3.8–8.4)

## 2021-02-03 LAB — HEPATITIS C ANTIBODY: Hep C Virus Ab: 0.1 s/co ratio (ref 0.0–0.9)

## 2021-02-03 NOTE — Progress Notes (Signed)
Internal Medicine Clinic Attending  Case discussed with Dr. Laddie Aquas  At the time of the visit.  We reviewed the resident's history and exam and pertinent patient test results.  I agree with the assessment, diagnosis, and plan of care documented in the resident's note.   Severe impingement syndrome on exam which is function limiting to this person with a physical job. Xray does not show rotator cuff arthropathy, rather it shows a subacromial bone spur which may be the culprit here. In this case, we will refer him orthopedic surgery as arthroscopic removal of this spur may be the best strategy to improve his impingement syndrome.

## 2021-02-06 ENCOUNTER — Telehealth: Payer: Self-pay | Admitting: Student

## 2021-02-06 ENCOUNTER — Other Ambulatory Visit: Payer: Self-pay | Admitting: Student

## 2021-02-06 DIAGNOSIS — E782 Mixed hyperlipidemia: Secondary | ICD-10-CM

## 2021-02-06 DIAGNOSIS — G8929 Other chronic pain: Secondary | ICD-10-CM

## 2021-02-06 DIAGNOSIS — M7541 Impingement syndrome of right shoulder: Secondary | ICD-10-CM

## 2021-02-06 DIAGNOSIS — M25571 Pain in right ankle and joints of right foot: Secondary | ICD-10-CM

## 2021-02-06 MED ORDER — ROSUVASTATIN CALCIUM 5 MG PO TABS: 5.0000 mg | ORAL_TABLET | Freq: Every day | ORAL | 11 refills | Status: DC

## 2021-02-06 MED ORDER — DICLOFENAC SODIUM 1 % EX GEL: 4.0000 g | Freq: Four times a day (QID) | CUTANEOUS | 1 refills | Status: AC

## 2021-02-06 NOTE — Telephone Encounter (Signed)
Called to speak with Darrell King (and his wife) regarding results from his most recent visit. His right shoulder x-ray did show narrowing of the [facility] joint with bony overgrowth and an overgrowth along the inferior border of the acromion that likely are resulting in function-limiting impingement of his supraspinatus with evidence of supraspinatus tendinosis on xray. We discussed that he would benefit from orthopedic surgery follow up. His uric acid came back at 6.7. He technically does not meet criteria for gout given ACR/eular score of 7 without history of aspirated fluid or imaging. Discussed that given history consistent with gout, we could further work this up (imaging/aspiration). His wife notes they are going to [facility] for vacation in the next 2 weeks and he will not be able to immediately follow up on this.   Finally, discussed his cholesterol came back abnormal (total 227, HDL 29, LDL 106). His wife states he was previously on atorvastatin, which he tolerated without side effects, although he discontinued this due to desire not to take medications daily. She also notes his blood pressure was as high as 200's systolic a few weeks ago (although was more normal during office visit, prescribed amlodipine 5mg  daily).   Plan:  - Refer to orthopedic surgery; made note that patient requests to follow with a different orthopedic surgeon  - Recommended Naproxen 500mg  BID PRN and prescribed Voltaren gel 4g QID PRN for right foot pain for now  - Ordered STAT right foot x-rays to assess for bony erosions  - Informed patient he can schedule an urgent [facility] appointment if he develops joint swelling for hopeful joint aspiration - If either of the latter are positive, definitive diagnosis of gout can be made and patient would benefit from initiation of allopurinol 100mg  daily + colchicine 0.6mg  daily (initially) for gout PPx. - Prescribed rosuvastatin 5mg  for hyperlipidemia   All questions answered.   , MD 02/06/2021, 6:51 PM Pager: [phone]

## 2021-02-07 ENCOUNTER — Other Ambulatory Visit: Payer: Self-pay | Admitting: Student

## 2021-02-07 MED ORDER — ASPIRIN 81 MG PO TBEC: 81.0000 mg | DELAYED_RELEASE_TABLET | Freq: Every day | ORAL | 3 refills | Status: DC

## 2021-02-07 NOTE — Telephone Encounter (Signed)
Received a refill request for ASA 81mg  daily. This is listed on patient's med list although it is not clear whether patient is taking this. He does have a history of unexplained syncopal episodes and possible TIA without hemorrhage, so would likely benefit from taking daily ASA.   - Prescribed ASA 81mg  daily   , MD 02/07/2021, 4:17 PM Pager: [phone]

## 2021-02-28 ENCOUNTER — Ambulatory Visit: Payer: No Typology Code available for payment source | Admitting: Orthopaedic Surgery

## 2021-03-23 ENCOUNTER — Encounter: Payer: Self-pay | Admitting: *Deleted

## 2021-04-17 ENCOUNTER — Telehealth: Payer: Self-pay

## 2021-04-17 NOTE — Telephone Encounter (Signed)
RTC to patient's wife [name]) to number listed, VM obtained which indicated it was "Darrell King's" VM. RN hung up and no message was left.  As of note, patient has an appt tomorrow at 1515 w/ Dr. Marijo Conception for evaluation of a rash. SChaplin, RN,BSN

## 2021-04-17 NOTE — Telephone Encounter (Signed)
Pt's wife requesting to speak with a nurse about rash on the body and want to know what medication can she get over the counter. Please call pt's wife back.

## 2021-04-18 ENCOUNTER — Ambulatory Visit (INDEPENDENT_AMBULATORY_CARE_PROVIDER_SITE_OTHER): Payer: Self-pay | Admitting: Student

## 2021-04-18 VITALS — BP 142/84 | Temp 98.2°F | Wt 305.0 lb

## 2021-04-18 DIAGNOSIS — L309 Dermatitis, unspecified: Secondary | ICD-10-CM

## 2021-04-18 MED ORDER — TRIAMCINOLONE ACETONIDE 0.5 % EX OINT: 1.0000 "application " | TOPICAL_OINTMENT | Freq: Two times a day (BID) | CUTANEOUS | 0 refills | Status: DC

## 2021-04-18 NOTE — Patient Instructions (Signed)
Darrell King, it was a pleasure seeing you today!  Today we discussed: - Rash: I have prescribed steroid cream for the rash. Apply 2-3 times daily for the next two weeks. I believe this will get better over time. If this doesn't improve, please return to clinic.  I have ordered the following labs today:  Lab Orders  No laboratory test(s) ordered today     Tests ordered today:  None  Referrals ordered today:   Referral Orders  No referral(s) requested today     I have ordered the following medication/changed the following medications:   Stop the following medications: There are no discontinued medications.   Start the following medications: Meds ordered this encounter  Medications   triamcinolone ointment (KENALOG) 0.5 %    Sig: Apply 1 application topically 2 (two) times daily for 15 days.    Dispense:  30 g    Refill:  0     Follow-up:  if symptoms do not improve    Please make sure to arrive 15 minutes prior to your next appointment. If you arrive late, you may be asked to reschedule.   We look forward to seeing you next time. Please call our clinic at [phone] if you have any questions or concerns. The best time to call is Monday-Friday from 9am-4pm, but there is someone available 24/7. If after hours or the weekend, call the main hospital number and ask for the Internal Medicine Resident On-Call. If you need medication refills, please notify your pharmacy one week in advance and they will send [place] a request.  Thank you for letting us take part in your care. Wishing you the best!  Thank you, Evlyn Kanner, MD

## 2021-04-18 NOTE — Progress Notes (Signed)
   CC: rash  HPI:  [name] is a 55 y.o. with medical history as below presenting to [facility] for rash.  Please see problem-based list for further details, assessments, and plans.  Past Medical History:  Diagnosis Date   Acute gout 12/31/2016   Anxiety    "had taken Paxil; back in 1996; nothing since" (10/21/2013)   Arthritis    Asthma    asa child   Complication of anesthesia    SLOW TO WAKE UP AFTER SURGERY IN 2019   Dyslipidemia 03/11/2016   12.5% 10-year risk of heart disease or stroke, should be on a moderate/high intensity statin.     Essential hypertension, benign 02/23/2016   Refusal of blood transfusions as patient is Jehovah's Witness    Review of Systems:  As per HPI  Physical Exam:  Vitals:   04/18/21 1517  BP: (!) 142/84  Temp: 98.2 F (36.8 C)  TempSrc: Oral  SpO2: 97%  Weight: (!) 305 lb (138.3 kg)   General: Well-developed, well-nourished. No acute distress CV: Regular rate, rhythm. No murmurs, rubs, gallops. Pulm: Normal work of breathing on room air. Clear to auscultation bilaterally. Skin: Small pruritic papules on dorsal aspect of bilateral hands without underlying erythema (pictured below). Small pruritic macules on back, abdomen, chest. (Pictured below)  Neuro: Awake, alert, answering questions appropriately.       Assessment & Plan:   See Encounters Tab for problem based charting.  Patient discussed with Dr. Antony Contras

## 2021-04-19 DIAGNOSIS — L309 Dermatitis, unspecified: Secondary | ICD-10-CM | POA: Insufficient documentation

## 2021-04-19 NOTE — Assessment & Plan Note (Signed)
Mr. Beavers is presenting to [facility] today to discuss a rash that began roughly five days ago. Around this time Mr. Fussell reports he was working out in his yard pulling weeds. Denies any known exposure to poison ivy/oak. Soon after this he began having intense pruritis in his groin region. Does note that this rash spared his penis. Soon after this he reports a rash had spread to his bilateral hands, sparing the palms, as well as his abdomen, chest, and back. Mr. Polhamus has tried over the counter hydrocortisone cream without relief. He denies any new soaps, detergents, clothes, or medications. Mentions he has not had a rash like this previously. Denies fevers, chills, joint pains, abdominal pain, dyspnea, chest pain.   Discussed with Mr. Hilleary that this likely is irritation from something in his yard, possibly contact dermatitis. I have instructed him to start prescription-strength steroid cream and return to clinic if there is no relief. Mr. Bress requested a steroid injection for more immediate relief, but I explained to him that this was not appropriate for his condition.  - Triamcinolone cream 0.5% twice daily for two weeks - Return to clinic if symptoms do not improve

## 2021-04-20 ENCOUNTER — Telehealth: Payer: Self-pay | Admitting: Physician Assistant

## 2021-04-20 ENCOUNTER — Encounter: Payer: Self-pay | Admitting: Physician Assistant

## 2021-04-20 DIAGNOSIS — L247 Irritant contact dermatitis due to plants, except food: Secondary | ICD-10-CM

## 2021-04-20 DIAGNOSIS — L309 Dermatitis, unspecified: Secondary | ICD-10-CM

## 2021-04-20 MED ORDER — TRIAMCINOLONE ACETONIDE 0.5 % EX OINT: 1.0000 "application " | TOPICAL_OINTMENT | Freq: Two times a day (BID) | CUTANEOUS | 0 refills | Status: AC

## 2021-04-20 MED ORDER — PREDNISONE 10 MG (21) PO TBPK
ORAL_TABLET | ORAL | 0 refills | Status: DC
Start: 2021-04-20 — End: 2021-10-16

## 2021-04-20 NOTE — Progress Notes (Signed)
Darrell King, Darrell King are scheduled for a virtual visit with your provider today.    Just as we do with appointments in the office, we must obtain your consent to participate.  Your consent will be active for this visit and any virtual visit you may have with one of our providers in the next 365 days.    If you have a MyChart account, I can also send a copy of this consent to you electronically.  All virtual visits are billed to your insurance company just like a traditional visit in the office.  As this is a virtual visit, video technology does not allow for your provider to perform a traditional examination.  This may limit your provider's ability to fully assess your condition.  If your provider identifies any concerns that need to be evaluated in person or the need to arrange testing such as labs, EKG, etc, we will make arrangements to do so.    Although advances in technology are sophisticated, we cannot ensure that it will always work on either your end or our end.  If the connection with a video visit is poor, we may have to switch to a telephone visit.  With either a video or telephone visit, we are not always able to ensure that we have a secure connection.   I need to obtain your verbal consent now.   Are you willing to proceed with your visit today?   Darrell King has provided verbal consent on 04/20/2021 for a virtual visit (video or telephone).   Margaretann Loveless, PA-C 04/20/2021  3:28 PM  Virtual Visit Consent   Darrell King, you are scheduled for a virtual visit with a  provider today.     Just as with appointments in the office, your consent must be obtained to participate.  Your consent will be active for this visit and any virtual visit you may have with one of our providers in the next 365 days.     If you have a MyChart account, a copy of this consent can be sent to you electronically.  All virtual visits are billed to your insurance company just like a  traditional visit in the office.    As this is a virtual visit, video technology does not allow for your provider to perform a traditional examination.  This may limit your provider's ability to fully assess your condition.  If your provider identifies any concerns that need to be evaluated in person or the need to arrange testing (such as labs, EKG, etc.), we will make arrangements to do so.     Although advances in technology are sophisticated, we cannot ensure that it will always work on either your end or our end.  If the connection with a video visit is poor, the visit may have to be switched to a telephone visit.  With either a video or telephone visit, we are not always able to ensure that we have a secure connection.     I need to obtain your verbal consent now.   Are you willing to proceed with your visit today?    Darrell King has provided verbal consent on 04/20/2021 for a virtual visit (video or telephone).   Margaretann Loveless, PA-C   Date: 04/20/2021 3:28 PM   Virtual Visit via Video Note   Darrell King, connected Darrell King@ ([id number], October 12, 1966) on 04/20/21 at  3:30 PM EDT by a video-enabled telemedicine application and verified that I am  speaking with the correct person using two identifiers.  Location: Patient: Virtual Visit Location Patient: Home Provider: Virtual Visit Location Provider: Home Office   I discussed the limitations of evaluation and management by telemedicine and the availability of in person appointments. The patient expressed understanding and agreed to proceed.    History of Present Illness: Darrell King is a 55 y.o. who identifies as a male who was assigned male at birth, and is being seen today for rash.  HPI:  Rash This is a new problem. The current episode started in the past 7 days (was seen on 04/18/21 by internal medicine for same issue; given triamcinolone cream that is not helping). The problem has been gradually worsening  since onset. The rash is diffuse. The rash is characterized by dryness, redness, scaling and itchiness. He was exposed to plant contact. Pertinent negatives include no congestion, cough, diarrhea, facial edema, fever, joint pain, nail changes, rhinorrhea, shortness of breath or sore throat. Past treatments include topical steroids and anti-itch cream. The treatment provided no relief.   Problems:  Patient Active Problem List   Diagnosis Date Noted   Dermatitis 04/19/2021   Encounter for hepatitis C screening test for low risk patient 02/02/2021   Obesity (BMI 30-39.9) 02/02/2021   Melanonychia 09/22/2019   Arthritis of right acromioclavicular joint    S/P arthroscopy of right shoulder 06/02/2019   Chronic Right Ankle Pain  12/31/2016   Dyslipidemia 03/11/2016   Essential hypertension, benign 02/23/2016   Tendinopathy of right rotator cuff 02/02/2016   Numbness and tingling of right upper extremity 11/29/2014   Patient is Jehovah's Witness 01/04/2014   Left inguinal hernia 11/11/2013    Allergies: No Known Allergies Medications:  Current Outpatient Medications:    predniSONE (STERAPRED UNI-PAK 21 TAB) 10 MG (21) TBPK tablet, 6 day taper; take as directed on package instructions, Disp: 21 tablet, Rfl: 0   amLODipine (NORVASC) 5 MG tablet, Take 1 tablet (5 mg total) by mouth daily., Disp: 90 tablet, Rfl: 3   aspirin 81 MG EC tablet, Take 1 tablet (81 mg total) by mouth daily., Disp: 90 tablet, Rfl: 3   diclofenac Sodium (VOLTAREN) 1 % GEL, Apply 4 g topically 4 (four) times daily., Disp: 350 g, Rfl: 1   rosuvastatin (CRESTOR) 5 MG tablet, Take 1 tablet (5 mg total) by mouth daily., Disp: 30 tablet, Rfl: 11   triamcinolone ointment (KENALOG) 0.5 %, Apply 1 application topically 2 (two) times daily for 15 days., Disp: 30 g, Rfl: 0  Observations/Objective: Patient is well-developed, well-nourished in no acute distress.  Resting comfortably in his recliner at home.  Head is normocephalic,  atraumatic.  No labored breathing. Speech is clear and coherent with logical content.  Patient is alert and oriented at baseline.  Rash not visualized on video today, but pictures available in MyChart from 04/18/21. Patient reports same lesions just now more on his chest, back, and neck compared to when he was seen in person.  Assessment and Plan: 1. Irritant contact dermatitis due to plants, except food - predniSONE (STERAPRED UNI-PAK 21 TAB) 10 MG (21) TBPK tablet; 6 day taper; take as directed on package instructions  Dispense: 21 tablet; Refill: 0  2. Dermatitis - triamcinolone ointment (KENALOG) 0.5 %; Apply 1 application topically 2 (two) times daily for 15 days.  Dispense: 30 g; Refill: 0 - Topical steroids not resolving rash, continues to spread - Will treat with oral steroid taper as noted above - Try to avoid scratching to  prevent spread - May take Benadryl or use benadryl cream OTC - Seek in-person evaluation if not improving or continues spread  Follow Up Instructions: I discussed the assessment and treatment plan with the patient. The patient was provided an opportunity to ask questions and all were answered. The patient agreed with the plan and demonstrated an understanding of the instructions.  A copy of instructions were sent to the patient via MyChart.  The patient was advised to call back or seek an in-person evaluation if the symptoms worsen or if the condition fails to improve as anticipated.  Time:  I spent 11 minutes with the patient via telehealth technology discussing the above problems/concerns.    Margaretann Loveless, PA-C

## 2021-04-20 NOTE — Progress Notes (Signed)
Internal Medicine Clinic Attending ? ?Case discussed with Dr. Braswell  At the time of the visit.  We reviewed the resident?s history and exam and pertinent patient test results.  I agree with the assessment, diagnosis, and plan of care documented in the resident?s note.  ?

## 2021-04-20 NOTE — Patient Instructions (Signed)
Contact Dermatitis Dermatitis is redness, soreness, and swelling (inflammation) of the skin. Contact dermatitis is a reaction to something that touches the skin. There are two types of contact dermatitis: Irritant contact dermatitis. This happens when something bothers (irritates) your skin, like soap. Allergic contact dermatitis. This is caused when you are exposed to something that you are allergic to, such as poison ivy. What are the causes? Common causes of irritant contact dermatitis include: Makeup. Soaps. Detergents. Bleaches. Acids. Metals, such as nickel. Common causes of allergic contact dermatitis include: Plants. Chemicals. Jewelry. Latex. Medicines. Preservatives in products, such as clothing. What increases the risk? Having a job that exposes you to things that bother your skin. Having asthma or eczema. What are the signs or symptoms? Symptoms may happen anywhere the irritant has touched your skin. Symptoms include: Dry or flaky skin. Redness. Cracks. Itching. Pain or a burning feeling. Blisters. Blood or clear fluid draining from skin cracks. With allergic contact dermatitis, swelling may occur. This may happen in places such as the eyelids, mouth, or genitals. How is this treated? This condition is treated by checking for the cause of the reaction and protecting your skin. Treatment may also include: Steroid creams, ointments, or medicines. Antibiotic medicines or other ointments, if you have a skin infection. Lotion or medicines to help with itching. A bandage (dressing). Follow these instructions at home: Skin care Moisturize your skin as needed. Put cool cloths on your skin. Put a baking soda paste on your skin. Stir water into baking soda until it looks like a paste. Do not scratch your skin. Avoid having things rub up against your skin. Avoid the use of soaps, perfumes, and dyes. Medicines Take or apply over-the-counter and prescription medicines  only as told by your doctor. If you were prescribed an antibiotic medicine, take or apply it as told by your doctor. Do not stop using it even if your condition starts to get better. Bathing Take a bath with: Epsom salts. Baking soda. Colloidal oatmeal. Bathe less often. Bathe in warm water. Avoid using hot water. Bandage care If you were given a bandage, change it as told by your health care provider. Wash your hands with soap and water before and after you change your bandage. If soap and water are not available, use hand sanitizer. General instructions Avoid the things that caused your reaction. If you do not know what caused it, keep a journal. Write down: What you eat. What skin products you use. What you drink. What you wear in the area that has symptoms. This includes jewelry. Check the affected areas every day for signs of infection. Check for: More redness, swelling, or pain. More fluid or blood. Warmth. Pus or a bad smell. Keep all follow-up visits as told by your doctor. This is important. Contact a doctor if: You do not get better with treatment. Your condition gets worse. You have signs of infection, such as: More swelling. Tenderness. More redness. Soreness. Warmth. You have a fever. You have new symptoms. Get help right away if: You have a very bad headache. You have neck pain. Your neck is stiff. You throw up (vomit). You feel very sleepy. You see red streaks coming from the area. Your bone or joint near the area hurts after the skin has healed. The area turns darker. You have trouble breathing. Summary Dermatitis is redness, soreness, and swelling of the skin. Symptoms may occur where the irritant has touched you. Treatment may include medicines and skin care. If you   do not know what caused your reaction, keep a journal. Contact a doctor if your condition gets worse or you have signs of infection. This information is not intended to replace advice  given to you by your health care provider. Make sure you discuss any questions you have with your health care provider. Document Revised: 02/04/2019 Document Reviewed: 04/30/2018 Elsevier Patient Education  2022 Elsevier Inc.   

## 2021-05-02 ENCOUNTER — Encounter: Payer: Self-pay | Admitting: *Deleted

## 2021-05-07 ENCOUNTER — Telehealth: Payer: Self-pay

## 2021-05-07 ENCOUNTER — Telehealth: Payer: Self-pay | Admitting: Family

## 2021-05-07 DIAGNOSIS — M25562 Pain in left knee: Secondary | ICD-10-CM

## 2021-05-07 DIAGNOSIS — M25469 Effusion, unspecified knee: Secondary | ICD-10-CM

## 2021-05-07 NOTE — Progress Notes (Signed)
Based on what you shared with me, I feel your condition warrants further evaluation and I recommend that you be seen in a face to face visit.  Given your symptoms, you need to be seen face to face to rule out an infection in your knee. This is very dangerous as needs to be rule out first before treating you for gout.    NOTE: There will be NO CHARGE for this eVisit   If you are having a true medical emergency please call 911.      For an urgent face to face visit, [place] has six urgent care centers for your convenience:     [facility] at [facility] Directions [phone] [address] [place], [place] [address]    [facility] Health Urgent Care Center [facility]) Get Driving Directions [phone] [address] [place], [place] [address]  [facility] Health Urgent Care Center [facility]) Get Driving Directions [phone] [address] [place],  [place]  [address]  [facility] Health Urgent Care at [facility] Get Driving Directions [phone] [address] [place], [place] [address]   [facility] at [facility] Get Driving Directions  [phone] [address] [place], [place] [address]   [facility] Health Urgent Care at [facility] Directions [phone] [address], [address] [place], [place] [address]  Your MyChart E-visit questionnaire answers were reviewed by a board certified advanced clinical practitioner to complete your personal care plan based on your specific symptoms.  Thank you for using e-Visits.

## 2021-10-16 ENCOUNTER — Encounter: Payer: Self-pay | Admitting: Internal Medicine

## 2021-10-16 ENCOUNTER — Telehealth: Payer: Self-pay

## 2021-10-16 ENCOUNTER — Ambulatory Visit: Payer: BC Managed Care – PPO | Admitting: Internal Medicine

## 2021-10-16 ENCOUNTER — Other Ambulatory Visit: Payer: Self-pay

## 2021-10-16 VITALS — BP 157/116 | HR 73 | Temp 97.5°F | Ht >= 80 in | Wt 303.0 lb

## 2021-10-16 DIAGNOSIS — I10 Essential (primary) hypertension: Secondary | ICD-10-CM

## 2021-10-16 DIAGNOSIS — S39012A Strain of muscle, fascia and tendon of lower back, initial encounter: Secondary | ICD-10-CM

## 2021-10-16 DIAGNOSIS — Z0000 Encounter for general adult medical examination without abnormal findings: Secondary | ICD-10-CM

## 2021-10-16 DIAGNOSIS — Z23 Encounter for immunization: Secondary | ICD-10-CM

## 2021-10-16 DIAGNOSIS — E785 Hyperlipidemia, unspecified: Secondary | ICD-10-CM

## 2021-10-16 DIAGNOSIS — Z299 Encounter for prophylactic measures, unspecified: Secondary | ICD-10-CM | POA: Diagnosis not present

## 2021-10-16 DIAGNOSIS — M545 Low back pain, unspecified: Secondary | ICD-10-CM | POA: Diagnosis not present

## 2021-10-16 MED ORDER — METHOCARBAMOL 750 MG PO TABS: 1500.0000 mg | ORAL_TABLET | Freq: Three times a day (TID) | ORAL | 1 refills | Status: DC | PRN

## 2021-10-16 MED ORDER — KETOROLAC TROMETHAMINE 30 MG/ML IJ SOLN
30.0000 mg | Freq: Once | INTRAMUSCULAR | Status: AC
  Administered 2021-10-16: 12:00:00 30 mg via INTRAMUSCULAR

## 2021-10-16 NOTE — Assessment & Plan Note (Addendum)
Assessment/Plan: Patient has htn that is uncontrolled on Norvasc. Last BMP 01/2021 showed Cr at 1.14. Home bp around 160s/100s. Clinic 157/116. Norvasc 5 mg but hasn't taken it in the last 2 weeks. States does not like to take any medications. Difficult to evaluate htn as patient is non-complaint with regimen and in acute pain.  -Continue Norvasc 5 mg -Follow up appointment in 2-4 weeks for htn. Advised pt to bring log.

## 2021-10-16 NOTE — Progress Notes (Signed)
° °  CC: back pain  HPI:  [name] is a 55 y.o. with medical history as below presenting to [facility] for back pain.  Please see problem-based list for further details, assessments, and plans.  Past Medical History:  Diagnosis Date   Acute gout 12/31/2016   Anxiety    "had taken Paxil; back in 1996; nothing since" (10/21/2013)   Arthritis    Asthma    asa child   Complication of anesthesia    SLOW TO WAKE UP AFTER SURGERY IN 2019   Dyslipidemia 03/11/2016   12.5% 10-year risk of heart disease or stroke, should be on a moderate/high intensity statin.     Essential hypertension, benign 02/23/2016   Refusal of blood transfusions as patient is Jehovah's Witness    Review of Systems:  Review of system negative unless stated in the problem list or HPI.    Physical Exam:  Vitals:   10/16/21 1020  BP: (!) 157/116  Pulse: 73  Temp: (!) 97.5 F (36.4 C)  TempSrc: Oral  SpO2: 97%  Weight: (!) 303 lb (137.4 kg)  Height: 6\' 9"  (2.057 m)    Physical Exam General: Well developed, in acute distress Head: Normocephalic without scalp lesions.  Eyes: PERRLA, Conjunctivae pink, sclerae white, without icterus.  Mouth and Throat: Lips normal color, without lesions. Moist mucus membrane. Neck: Neck supple with full range of motion (ROM). No masses or tenderness.  Lungs: CTAB, no wheeze, rhonchi or rales.  Cardiovascular: Normal heart sounds, no r/m/g, 2+ pulses in all extremities. No LE edema Abdomen: No TTP, normal bowel sounds MSK: No asymmetry or muscle atrophy. TTP in lumbar region. 5/5 strength in all extremities.  Skin: warm, dry good skin turgor, no lesions noted Neuro: Alert and oriented. CN grossly intact Psych: Normal mood and normal affect   Assessment & Plan:   See Encounters Tab for problem based charting.  Patient seen with Dr. , MD

## 2021-10-16 NOTE — Patient Instructions (Addendum)
Darrell King, it was a pleasure seeing you today! You endorsed feeling well today. Below are some of the things we talked about this visit. We look forward to seeing you in the follow up appointment!  Today we discussed: Back Pain: Your back pain appears to be from a muscle spasm. We will give you a shot in the clinic of a NSAID. We will also start you on a muscle relaxer. Please take no more than 3 times per day as needed for back pain. Continue using tylenol up to 3000 mg per day and heat pack on your back.  High Blood Pressure and Cholesterol: For your high blood pressure and cholesterol, please take the Norvasc and Crestor every day. We would like to see you in the clinic in 2 weeks to evaluate these. Please bring a BP log to this visit.   I have ordered the following labs today:  Lab Orders  No laboratory test(s) ordered today      Referrals ordered today:   Referral Orders  No referral(s) requested today     I have ordered the following medication/changed the following medications:   Stop the following medications: Medications Discontinued During This Encounter  Medication Reason   aspirin 81 MG EC tablet Patient has not taken in last 30 days   predniSONE (STERAPRED UNI-PAK 21 TAB) 10 MG (21) TBPK tablet Completed Course     Start the following medications: Meds ordered this encounter  Medications   ketorolac (TORADOL) 30 MG/ML injection 30 mg   methocarbamol (ROBAXIN-750) 750 MG tablet    Sig: Take 2 tablets (1,500 mg total) by mouth 3 (three) times daily as needed for up to 14 days for muscle spasms. Take for lower back pain.    Dispense:  42 tablet    Refill:  1     Follow-up: 2 weeks for bp check  Please make sure to arrive 15 minutes prior to your next appointment. If you arrive late, you may be asked to reschedule.   We look forward to seeing you next time. Please call our clinic at [phone] if you have any questions or concerns. The best time to call  is Monday-Friday from 9am-4pm, but there is someone available 24/7. If after hours or the weekend, call the main hospital number and ask for the Internal Medicine Resident On-Call. If you need medication refills, please notify your pharmacy one week in advance and they will send [place] a request.  Thank you for letting us take part in your care. Wishing you the best!  Thank you, Gwenevere Abbot, MD

## 2021-10-16 NOTE — Assessment & Plan Note (Signed)
Assessment/Plan: Patient has hld and was started on Crestor 5 mg daily. States he doesn't remember taking this medication.  -Advised patient to restart this medicine as he has ample refills -Follow up in 2 weeks on Crestor compliance

## 2021-10-16 NOTE — Telephone Encounter (Signed)
Return pt's call - stated he has been having lower back pain x 2 weeks' taking Tylenol and Ibuprofen which are not helping. Stated he's able to come in today - no open slots on yellow team; Appt given with Dr Welton Flakes @ 1015 AM.

## 2021-10-16 NOTE — Assessment & Plan Note (Signed)
Assessment/Plan: Patient with pmh of htn, hld and obesity presenting with acute lumbar back pain. DDx lumbar strain vs disc herniation vs spinal stenosis. Most consistent with lumbar strain as pain is localized to the back with out any radiculopathy. On PE tenderness to palpation on lumbar region. Strength 5/5 on all extremities. -Robaxin 1500 mg TID PRN -Tylenol 1000 mg tid prn -Toradol 30 mg injection in clinic -Salonpas OTC as needed -Will monitor at follow up appointment

## 2021-10-16 NOTE — Telephone Encounter (Signed)
Pt is requesting a call back . He has been having lower back pain for two weeks now .. was given and appt 12/22 @ 10:15 with Dr August Saucer

## 2021-10-18 NOTE — Progress Notes (Signed)
Internal Medicine Clinic Attending  I saw and evaluated the patient.  I personally confirmed the key portions of the history and exam documented by Dr. Khan and I reviewed pertinent patient test results.  The assessment, diagnosis, and plan were formulated together and I agree with the documentation in the resident's note.  

## 2021-10-19 ENCOUNTER — Encounter: Payer: Self-pay | Admitting: Internal Medicine

## 2021-10-20 ENCOUNTER — Telehealth: Payer: Self-pay | Admitting: Student

## 2021-10-20 DIAGNOSIS — S39012A Strain of muscle, fascia and tendon of lower back, initial encounter: Secondary | ICD-10-CM

## 2021-10-20 MED ORDER — METHOCARBAMOL 750 MG PO TABS: 1500.0000 mg | ORAL_TABLET | Freq: Three times a day (TID) | ORAL | 0 refills | Status: AC

## 2021-10-21 NOTE — Telephone Encounter (Signed)
Patient paged on-call provider regarding back pain.  He was evaluated in office on 12/19 for back pain likely secondary to muscle spasm.  States he has been taking Robaxin times daily for pain which is improved the pain somewhat.  But still continues to have significant back pain when not on medication.  States he is to tablets left and is concerned that his back pain is not improving.  He is continue to use Tylenol, ibuprofen, and heat.  He is concerned that his back pain has not improved.  He denies fevers, chills, loss of sensation, loss of bowel or bladder function.  He has been laying in bed since the back pain started about 2 weeks ago and feels like his back is more stiff.  Discussed the importance of mobilization back stretches to help with back pain in addition to other supportive measures he has been doing.  We will give him a 3-day supply of Robaxin directed patient to call clinic on Tuesday if his back pain does not improve or is getting worse.  Patient is agreeable with plan.

## 2021-11-07 ENCOUNTER — Ambulatory Visit (HOSPITAL_BASED_OUTPATIENT_CLINIC_OR_DEPARTMENT_OTHER): Payer: BC Managed Care – PPO | Admitting: Family Medicine

## 2021-11-07 ENCOUNTER — Other Ambulatory Visit: Payer: Self-pay

## 2021-11-07 ENCOUNTER — Encounter (HOSPITAL_BASED_OUTPATIENT_CLINIC_OR_DEPARTMENT_OTHER): Payer: Self-pay | Admitting: Family Medicine

## 2021-11-07 VITALS — BP 140/80 | HR 76 | Ht 79.0 in | Wt 306.0 lb

## 2021-11-07 DIAGNOSIS — I10 Essential (primary) hypertension: Secondary | ICD-10-CM | POA: Diagnosis not present

## 2021-11-07 DIAGNOSIS — E785 Hyperlipidemia, unspecified: Secondary | ICD-10-CM

## 2021-11-07 DIAGNOSIS — Z0000 Encounter for general adult medical examination without abnormal findings: Secondary | ICD-10-CM

## 2021-11-07 DIAGNOSIS — Z8739 Personal history of other diseases of the musculoskeletal system and connective tissue: Secondary | ICD-10-CM | POA: Diagnosis not present

## 2021-11-07 DIAGNOSIS — E782 Mixed hyperlipidemia: Secondary | ICD-10-CM | POA: Diagnosis not present

## 2021-11-07 DIAGNOSIS — M545 Low back pain, unspecified: Secondary | ICD-10-CM

## 2021-11-07 MED ORDER — KETOROLAC TROMETHAMINE 60 MG/2ML IM SOLN
60.0000 mg | Freq: Once | INTRAMUSCULAR | Status: AC
  Administered 2021-11-07: 60 mg via INTRAMUSCULAR

## 2021-11-07 MED ORDER — AMLODIPINE BESYLATE 5 MG PO TABS: 5.0000 mg | ORAL_TABLET | Freq: Every day | ORAL | 2 refills | Status: DC

## 2021-11-07 MED ORDER — SHINGRIX 50 MCG/0.5ML IM SUSR: 0.5000 mL | Freq: Once | INTRAMUSCULAR | 0 refills | Status: AC

## 2021-11-07 MED ORDER — ROSUVASTATIN CALCIUM 5 MG PO TABS: 5.0000 mg | ORAL_TABLET | Freq: Every day | ORAL | 2 refills | Status: DC

## 2021-11-07 MED ORDER — CYCLOBENZAPRINE HCL 10 MG PO TABS: 10.0000 mg | ORAL_TABLET | Freq: Three times a day (TID) | ORAL | 0 refills | Status: DC | PRN

## 2021-11-07 NOTE — Progress Notes (Signed)
New Patient Office Visit  Subjective:  Patient ID: Darrell King, male    DOB: Feb 18, 1966  Age: 56 y.o. MRN: [id number]  CC:  Chief Complaint  Patient presents with   Establish Care    Prior PCP - Dr. Ventura Bruns (Notes in [place])   Back Pain    Patient states that for the last month he has been having low back pain. He thinks that he may have mistepped at work and aggravated his back. He did consult his prior PCP about the issue was was prescribed a muscle relaxer and given a steroid injection. He states neither helped with the pain.    Medication Refill    Patient states he has been out of his medications for months and will need renewals.    HPI Darrell King is a 56 year old male presenting to establish in clinic.  He has current concerns as outlined above.  Past medical history significant for hyperlipidemia, hypertension, gout.  Low back pain: Has been going on for about 1 month.  Had evaluation with prior provider last month and received IM anti-inflammatory, prescription for muscle relaxant.  Indicates that symptoms have persisted.  Pain is primarily over low back, bilaterally, no radiation of symptoms.  Denies any associated bowel or bladder issues.  Does not have any associated numbness or tingling.  Pain is worse with prolonged standing.  Hypertension: Reports that he will check his blood pressure occasionally at home, reports that home readings tend to be about 150/100.  Current medication includes amlodipine 5 mg.  Indicates that he will take this as prescribed typically.  Denies any current issues related to chest pain, headaches, lightheadedness or dizziness.  Hyperlipidemia: Has been found on prior labs, recommended to be taking rosuvastatin, admits that he takes this infrequently.  Does not have any specific reason, reports that he will see it in the medicine cabinet but just does not take it.  Reports history of gout, indicates that he will have a "flare" about once per  week.  Indicates that this consists of sharp shooting pain along the inside of his foot that will occur for short period and then resolve.  Patient is originally from [place], has been living in the area for more than 20 years.  Currently working in maintenance.  Past Medical History:  Diagnosis Date   Acute gout 12/31/2016   Anxiety    "had taken Paxil; back in 1996; nothing since" (10/21/2013)   Arthritis    Asthma    asa child   Complication of anesthesia    SLOW TO WAKE UP AFTER SURGERY IN 2019   Dyslipidemia 03/11/2016   12.5% 10-year risk of heart disease or stroke, should be on a moderate/high intensity statin.     Encounter for hepatitis C screening test for low risk patient 02/02/2021   Essential hypertension, benign 02/23/2016   Refusal of blood transfusions as patient is Jehovah's Witness     Past Surgical History:  Procedure Laterality Date   COLONOSCOPY W/ POLYPECTOMY     INGUINAL HERNIA REPAIR Right 1995   INGUINAL HERNIA REPAIR Left 11/12/2013   Procedure: HERNIA REPAIR INGUINAL INCARCERATED;  Surgeon: Odis Hollingshead, MD;  Location: [facility];  Service: General;  Laterality: Left;   SHOULDER ARTHROSCOPY WITH OPEN ROTATOR CUFF REPAIR AND DISTAL CLAVICLE ACROMINECTOMY Right 06/02/2019   Procedure: right shoulder arthroscopy , mini open rotator cuff repair;  Surgeon: Meredith Pel, MD;  Location: [facility];  Service: Orthopedics;  Laterality: Right;  SHOULDER ARTHROSCOPY WITH SUBACROMIAL DECOMPRESSION, ROTATOR CUFF REPAIR AND BICEP TENDON REPAIR Right 07/14/2018   Procedure: RIGHT SHOULDER ARTHROSCOPY, DEBRIDEMENT, BICEPS TENODESIS, DISTAL CLAVICLE EXCISION AND MINI OPEN ROTATOR CUFF TEAR REPAIR;  Surgeon: Meredith Pel, MD;  Location: [facility];  Service: Orthopedics;  Laterality: Right;    Family History  Problem Relation Age of Onset   Colon cancer Mother        colon   Cancer Mother    Cancer Sister        dont know what type    Social History    Socioeconomic History   Marital status: Married    Spouse name: Not on file   Number of children: 3   Years of education: Not on file   Highest education level: Not on file  Occupational History   Occupation: Maintenance Tech  Tobacco Use   Smoking status: Never   Smokeless tobacco: Never  Vaping Use   Vaping Use: Never used  Substance and Sexual Activity   Alcohol use: Yes    Alcohol/week: 1.0 - 2.0 standard drink    Types: 1 - 2 Shots of liquor per week    Comment: occasionally.   Drug use: No   Sexual activity: Yes    Birth control/protection: None  Other Topics Concern   Not on file  Social History Narrative   Enjoys bowling   Works as a Games developer Strain: Not on file  Food Insecurity: Not on file  Transportation Needs: Not on file  Physical Activity: Not on file  Stress: Not on file  Social Connections: Not on file  Intimate Partner Violence: Not on file    Objective:   Today's Vitals: BP 140/80    Pulse 76    Ht 6\' 7"  (2.007 m)    Wt (!) 306 lb (138.8 kg)    SpO2 98%    BMI 34.47 kg/m   Physical Exam  56 year old male in no acute distress Cardiovascular exam with regular rate and rhythm, no murmur appreciated Lungs clear to auscultation bilaterally Lumbar spine: Notable tenderness palpation through paraspinal muscles bilaterally in lumbar region.  Normal reflexes in bilateral lower extremities, distal neurovascular exam intact.  Assessment & Plan:   Problem List Items Addressed This Visit       Cardiovascular and Mediastinum   Essential hypertension, benign - Primary    Blood pressure borderline in office today, patient with questionable compliance on history At this time, will continue with current dose of amlodipine and follow-up at future office visits regarding home readings and may determination if dose changes needed or additional medication needs to be added Recommend DASH diet, regular  aerobic exercise Patient would also likely benefit from healthy, gradual weight loss      Relevant Medications   amLODipine (NORVASC) 5 MG tablet   rosuvastatin (CRESTOR) 5 MG tablet   Other Relevant Orders   CBC with Differential/Platelet   Comprehensive metabolic panel   Hemoglobin A1c   Lipid panel     Other   Dyslipidemia    Recommend being consistent with statin therapy, patient has generally been missing doses, no specific reason as to why other than forgetting We will plan to check lipid panel with upcoming wellness exam      Relevant Medications   rosuvastatin (CRESTOR) 5 MG tablet   Low back pain    Suspect current symptoms related to spasm of lumbar paraspinal muscles Given duration of  symptoms, will proceed with x-ray imaging Toradol 60 mg IM administered in office today, advised to hold oral NSAIDs for 24 hours, can utilize Tylenol for any breakthrough pain May continue with topical treatments including heat, ice Recommend referral to physical therapy for further evaluation management Continue to perform activities as tolerated, avoid bedrest Monitor status at follow-up appointment about 2 to 3 months      Relevant Medications   ketorolac (TORADOL) injection 60 mg   cyclobenzaprine (FLEXERIL) 10 MG tablet   Other Relevant Orders   DG Lumbar Spine Complete   Other Visit Diagnoses     Wellness examination       Relevant Orders   CBC with Differential/Platelet   Comprehensive metabolic panel   Hemoglobin A1c   TSH Rfx on Abnormal to Free T4   Hx of gout       Relevant Orders   Uric acid   Mixed hyperlipidemia       Relevant Medications   amLODipine (NORVASC) 5 MG tablet   rosuvastatin (CRESTOR) 5 MG tablet       Outpatient Encounter Medications as of 11/07/2021  Medication Sig   cyclobenzaprine (FLEXERIL) 10 MG tablet Take 1 tablet (10 mg total) by mouth 3 (three) times daily as needed for muscle spasms.   diclofenac Sodium (VOLTAREN) 1 % GEL Apply 4  g topically 4 (four) times daily.   Zoster Vaccine Adjuvanted [facility]) injection Inject 0.5 mLs into the muscle once for 1 dose. Administer at 0 and 2-6 months for 2 total doses.  To be administered by pharmacy.   [DISCONTINUED] amLODipine (NORVASC) 5 MG tablet Take 1 tablet (5 mg total) by mouth daily.   [DISCONTINUED] rosuvastatin (CRESTOR) 5 MG tablet Take 1 tablet (5 mg total) by mouth daily.   amLODipine (NORVASC) 5 MG tablet Take 1 tablet (5 mg total) by mouth daily.   rosuvastatin (CRESTOR) 5 MG tablet Take 1 tablet (5 mg total) by mouth daily.   Facility-Administered Encounter Medications as of 11/07/2021  Medication   ketorolac (TORADOL) injection 60 mg    Follow-up: No follow-ups on file.  Plan for follow-up in about 2 to 3 months for wellness exam, nurse visit only prior for labs  Darrell Hazard J [place], MD

## 2021-11-07 NOTE — Patient Instructions (Addendum)
°  Medication Instructions:  Your physician has recommended you make the following change in your medication:  -- START Flexeril 10 mg - Take 1 tablet by mouth up to three time daily as needed --If you need a refill on any your medications before your next appointment, please call your pharmacy first. If no refills are authorized on file call the office.--  Referrals/Procedures/Imaging: Your physician recommends that you have an XRAY of your Lower Back. X-rays are a type of radiation called electromagnetic waves. X-ray imaging creates pictures of the inside of your body. The images show the parts of your body in different shades of black and white. This is because different tissues absorb different amounts of radiation. Calcium in bones absorbs x-rays the most, so bones look white. Fat and other soft tissues absorb less and look gray. Air absorbs the least, so lungs look black     1. You may have this done at the [facility], located in the [facility] Building on the 1st floor.    2. You do no have to have an appointment.    3. [address] [place], [place] [address]        [phone]        Monday - Friday  8:00 am - 5:00 pm 4. [facility]  [facility] at [facility]. [facility] at [facility] is a premier diagnostic imaging center that offers a 64-slice CT scanner and interventional radiology services.  A referral has been placed for you to [facility] for evaluation and treatment. Someone from the scheduling department will be in contact with you in regards to coordinating your consultation. If you do not hear from any of the schedulers within 7-10 business days please give their office a call.  [facility] [address] [place], [place] [address] Phone: [phone]  Follow-Up: Your next appointment:   Your physician recommends that you schedule a follow-up  appointment in: 2-3 MONTHS for CPE with Dr. de Peru  You will receive a text message or e-mail with a link to a survey about your care and experience with [place] today! We would greatly appreciate your feedback!   Thanks for letting us be apart of your health journey!!  Primary Care and Sports Medicine   Dr. Ceasar Mons Peru   We encourage you to activate your patient portal called "MyChart".  Sign up information is provided on this After Visit Summary.  MyChart is used to connect with patients for Virtual Visits (Telemedicine).  Patients are able to view lab/test results, encounter notes, upcoming appointments, etc.  Non-urgent messages can be sent to your provider as well. To learn more about what you can do with MyChart, please visit --  [email or web address].

## 2021-11-07 NOTE — Assessment & Plan Note (Signed)
Recommend being consistent with statin therapy, patient has generally been missing doses, no specific reason as to why other than forgetting We will plan to check lipid panel with upcoming wellness exam

## 2021-11-07 NOTE — Assessment & Plan Note (Signed)
Blood pressure borderline in office today, patient with questionable compliance on history At this time, will continue with current dose of amlodipine and follow-up at future office visits regarding home readings and may determination if dose changes needed or additional medication needs to be added Recommend DASH diet, regular aerobic exercise Patient would also likely benefit from healthy, gradual weight loss

## 2021-11-07 NOTE — Assessment & Plan Note (Signed)
Suspect current symptoms related to spasm of lumbar paraspinal muscles Given duration of symptoms, will proceed with x-ray imaging Toradol 60 mg IM administered in office today, advised to hold oral NSAIDs for 24 hours, can utilize Tylenol for any breakthrough pain May continue with topical treatments including heat, ice Recommend referral to physical therapy for further evaluation management Continue to perform activities as tolerated, avoid bedrest Monitor status at follow-up appointment about 2 to 3 months

## 2021-12-18 ENCOUNTER — Telehealth: Payer: BC Managed Care – PPO | Admitting: Physician Assistant

## 2021-12-18 DIAGNOSIS — Z91199 Patient's noncompliance with other medical treatment and regimen due to unspecified reason: Secondary | ICD-10-CM

## 2021-12-18 NOTE — Progress Notes (Signed)
Patient driving at time of video visit despite schedule visit for this time. Discussed need to pull over and he said he would be where he was going in a bit and could call back in 10-15 minutes. Let him know he would need to reschedule for later today as that would put him 15-20 minutes late for appointment and others are scheduled right after him. He voiced understanding.

## 2021-12-19 ENCOUNTER — Telehealth: Payer: BC Managed Care – PPO

## 2021-12-19 ENCOUNTER — Telehealth: Payer: BC Managed Care – PPO | Admitting: Physician Assistant

## 2021-12-19 ENCOUNTER — Telehealth (INDEPENDENT_AMBULATORY_CARE_PROVIDER_SITE_OTHER): Payer: BC Managed Care – PPO | Admitting: Family Medicine

## 2021-12-19 DIAGNOSIS — M109 Gout, unspecified: Secondary | ICD-10-CM

## 2021-12-19 DIAGNOSIS — M25562 Pain in left knee: Secondary | ICD-10-CM

## 2021-12-19 MED ORDER — PREDNISONE 10 MG PO TABS: ORAL_TABLET | ORAL | 0 refills | Status: AC

## 2021-12-19 NOTE — Progress Notes (Signed)
Virtual Visit Consent   Darrell King, you are scheduled for a virtual visit with a [facility] provider today.     Just as with appointments in the office, your consent must be obtained to participate.  Your consent will be active for this visit and any virtual visit you may have with one of our providers in the next 365 days.     If you have a MyChart account, a copy of this consent can be sent to you electronically.  All virtual visits are billed to your insurance company just like a traditional visit in the office.    As this is a virtual visit, video technology does not allow for your provider to perform a traditional examination.  This may limit your provider's ability to fully assess your condition.  If your provider identifies any concerns that need to be evaluated in person or the need to arrange testing (such as labs, EKG, etc.), we will make arrangements to do so.     Although advances in technology are sophisticated, we cannot ensure that it will always work on either your end or our end.  If the connection with a video visit is poor, the visit may have to be switched to a telephone visit.  With either a video or telephone visit, we are not always able to ensure that we have a secure connection.     I need to obtain your verbal consent now.   Are you willing to proceed with your visit today?    [name] has provided verbal consent on 12/19/2021 for a virtual visit (video or telephone).   Darrell King, [place]   Date: 12/19/2021 4:43 PM   Virtual Visit via Video Note   I, Darrell King, connected with  DERREON King  ([id number], 01/11/1966) on 12/19/21 at  4:15 PM EST by a video-enabled telemedicine application and verified that I am speaking with the correct person using two identifiers.  Location: Patient: Virtual Visit Location Patient: Home Provider: Virtual Visit Location Provider: Home Office   I discussed the limitations of evaluation and  management by telemedicine and the availability of in person appointments. The patient expressed understanding and agreed to proceed.    History of Present Illness: Darrell King is a 56 y.o. who identifies as a male who was assigned male at birth, and is being seen today for L knee pain x 3-4 days. Notes redness, warmth and tenderness. Denies any trauma or injury. Is able to bare weight but some increased pain with walking. Denies decreased ROM. Denies fever, chills or aches. Pain is constant. Has history of gout but typically in his foot  with last flare about 6 months ago.    HPI: HPI  Problems:  Patient Active Problem List   Diagnosis Date Noted   Low back pain 10/16/2021   Healthcare maintenance 10/16/2021   Dermatitis 04/19/2021   Obesity (BMI 30-39.9) 02/02/2021   Melanonychia 09/22/2019   Arthritis of right acromioclavicular joint    S/P arthroscopy of right shoulder 06/02/2019   Chronic Right Ankle Pain  12/31/2016   Dyslipidemia 03/11/2016   Essential hypertension, benign 02/23/2016   Tendinopathy of right rotator cuff 02/02/2016   Numbness and tingling of right upper extremity 11/29/2014   Patient is Jehovah's Witness 01/04/2014   Left inguinal hernia 11/11/2013    Allergies: No Known Allergies Medications:  Current Outpatient Medications:    predniSONE (DELTASONE) 10 MG tablet, Take 4 tablets (40 mg total)  by mouth daily with breakfast for 5 days, THEN 3 tablets (30 mg total) daily with breakfast for 2 days, THEN 2 tablets (20 mg total) daily with breakfast for 2 days, THEN 1 tablet (10 mg total) daily with breakfast for 2 days., Disp: 32 tablet, Rfl: 0   amLODipine (NORVASC) 5 MG tablet, Take 1 tablet (5 mg total) by mouth daily., Disp: 30 tablet, Rfl: 2   cyclobenzaprine (FLEXERIL) 10 MG tablet, Take 1 tablet (10 mg total) by mouth 3 (three) times daily as needed for muscle spasms., Disp: 30 tablet, Rfl: 0   diclofenac Sodium (VOLTAREN) 1 % GEL, Apply 4 g topically 4  (four) times daily., Disp: 350 g, Rfl: 1   rosuvastatin (CRESTOR) 5 MG tablet, Take 1 tablet (5 mg total) by mouth daily., Disp: 30 tablet, Rfl: 2  Observations/Objective: Patient is well-developed, well-nourished in no acute distress.  Resting comfortably at home.  Head is normocephalic, atraumatic.  No labored breathing. Speech is clear and coherent with logical content.  Patient is alert and oriented at baseline.  L knee examined via video. He is able to extend the leg fully albeit with some pain. Erythema noted with significant diffuse tenderness around the joint per patient while palpating. No posterior knee redness or pain.   Assessment and Plan: 1. Acute gout of left knee, unspecified cause - predniSONE (DELTASONE) 10 MG tablet; Take 4 tablets (40 mg total) by mouth daily with breakfast for 5 days, THEN 3 tablets (30 mg total) daily with breakfast for 2 days, THEN 2 tablets (20 mg total) daily with breakfast for 2 days, THEN 1 tablet (10 mg total) daily with breakfast for 2 days.  Dispense: 32 tablet; Refill: 0  No trauma or injury. Normal ROM. No alarm signs or symptoms concerning for septic joint. Suspect gout or pseudogout of knee. Will start treatment with Prednisone 40 mg daily x 5 days, then tapering down. Supportive measures and OTC medications reviewed. Diet recommendations discussed. Strict UC/ER precautions reviewed.   Follow Up Instructions: I discussed the assessment and treatment plan with the patient. The patient was provided an opportunity to ask questions and all were answered. The patient agreed with the plan and demonstrated an understanding of the instructions.  A copy of instructions were sent to the patient via MyChart unless otherwise noted below.   The patient was advised to call back or seek an in-person evaluation if the symptoms worsen or if the condition fails to improve as anticipated.  Time:  I spent 12 minutes with the patient via telehealth technology  discussing the above problems/concerns.    Darrell Climes, PA-C

## 2021-12-19 NOTE — Patient Instructions (Signed)
Darrell King, thank you for joining [name], PA-C for today's virtual visit.  While this provider is not your primary care provider (PCP), if your PCP is located in our provider database this encounter information will be shared with them immediately following your visit.  Consent: (Patient) Darrell King provided verbal consent for this virtual visit at the beginning of the encounter.  Current Medications:  Current Outpatient Medications:    predniSONE (DELTASONE) 10 MG tablet, Take 4 tablets (40 mg total) by mouth daily with breakfast for 5 days, THEN 3 tablets (30 mg total) daily with breakfast for 2 days, THEN 2 tablets (20 mg total) daily with breakfast for 2 days, THEN 1 tablet (10 mg total) daily with breakfast for 2 days., Disp: 32 tablet, Rfl: 0   amLODipine (NORVASC) 5 MG tablet, Take 1 tablet (5 mg total) by mouth daily., Disp: 30 tablet, Rfl: 2   cyclobenzaprine (FLEXERIL) 10 MG tablet, Take 1 tablet (10 mg total) by mouth 3 (three) times daily as needed for muscle spasms., Disp: 30 tablet, Rfl: 0   diclofenac Sodium (VOLTAREN) 1 % GEL, Apply 4 g topically 4 (four) times daily., Disp: 350 g, Rfl: 1   rosuvastatin (CRESTOR) 5 MG tablet, Take 1 tablet (5 mg total) by mouth daily., Disp: 30 tablet, Rfl: 2   Medications ordered in this encounter:  Meds ordered this encounter  Medications   predniSONE (DELTASONE) 10 MG tablet    Sig: Take 4 tablets (40 mg total) by mouth daily with breakfast for 5 days, THEN 3 tablets (30 mg total) daily with breakfast for 2 days, THEN 2 tablets (20 mg total) daily with breakfast for 2 days, THEN 1 tablet (10 mg total) daily with breakfast for 2 days.    Dispense:  32 tablet    Refill:  0    Order Specific Question:   Supervising Provider    Answer:   Hyacinth Meeker, BRIAN [[id number]]     *If you need refills on other medications prior to your next appointment, please contact your pharmacy*  Follow-Up: Call back or seek an in-person  evaluation if the symptoms worsen or if the condition fails to improve as anticipated.  Other Instructions Please ice the knee. Elevate the leg while resting. Take the prednisone as directed. You can take Tylenol OTC along with this. If not improving substantially within 72 hours or any worsening symptoms despite treatment, you need to be evaluated in person. If you develop fever or inability to move the joint -- ER ASAP. Do not delay care.   Gout Gout is painful swelling of your joints. Gout is a type of arthritis. It is caused by having too much uric acid in your body. Uric acid is a chemical that is made when your body breaks down substances called purines. If your body has too much uric acid, sharp crystals can form and build up in your joints. This causes pain and swelling. Gout attacks can happen quickly and be very painful (acute gout). Over time, the attacks can affect more joints and happen more often (chronic gout). What are the causes? Too much uric acid in your blood. This can happen because: Your kidneys do not remove enough uric acid from your blood. Your body makes too much uric acid. You eat too many foods that are high in purines. These foods include organ meats, some seafood, and beer. Trauma or stress. What increases the risk? Having a family history of gout. Being male and  middle-aged. Being male and having gone through menopause. Being very overweight (obese). Drinking alcohol, especially beer. Not having enough water in the body (being dehydrated). Losing weight too quickly. Having an organ transplant. Having lead poisoning. Taking certain medicines. Having kidney disease. Having a skin condition called psoriasis. What are the signs or symptoms? An attack of acute gout usually happens in just one joint. The most common place is the big toe. Attacks often start at night. Other joints that may be affected include joints of the feet, ankle, knee, fingers, wrist,  or elbow. Symptoms of an attack may include: Very bad pain. Warmth. Swelling. Stiffness. Shiny, red, or purple skin. Tenderness. The affected joint may be very painful to touch. Chills and fever. Chronic gout may cause symptoms more often. More joints may be involved. You may also have white or yellow lumps (tophi) on your hands or feet or in other areas near your joints. How is this treated? Treatment for this condition has two phases: treating an acute attack and preventing future attacks. Acute gout treatment may include: NSAIDs. Steroids. These are taken by mouth or injected into a joint. Colchicine. This medicine relieves pain and swelling. It can be given by mouth or through an IV tube. Preventive treatment may include: Taking small doses of NSAIDs or colchicine daily. Using a medicine that reduces uric acid levels in your blood. Making changes to your diet. You may need to see a food expert (dietitian) about what to eat and drink to prevent gout. Follow these instructions at home: During a gout attack  If told, put ice on the painful area: Put ice in a plastic bag. Place a towel between your skin and the bag. Leave the ice on for 20 minutes, 2-3 times a day. Raise (elevate) the painful joint above the level of your heart as often as you can. Rest the joint as much as possible. If the joint is in your leg, you may be given crutches. Follow instructions from your doctor about what you cannot eat or drink. Avoiding future gout attacks Eat a low-purine diet. Avoid foods and drinks such as: Liver. Kidney. Anchovies. Asparagus. Herring. Mushrooms. Mussels. Beer. Stay at a healthy weight. If you want to lose weight, talk with your doctor. Do not lose weight too fast. Start or continue an exercise plan as told by your doctor. Eating and drinking Drink enough fluids to keep your pee (urine) pale yellow. If you drink alcohol: Limit how much you use to: 0-1 drink a day for  women. 0-2 drinks a day for men. Be aware of how much alcohol is in your drink. In the [place] one drink equals one 12 oz bottle of beer (355 mL), one 5 oz glass of wine (148 mL), or one 1 oz glass of hard liquor (44 mL). General instructions Take over-the-counter and prescription medicines only as told by your doctor. Do not drive or use heavy machinery while taking prescription pain medicine. Return to your normal activities as told by your doctor. Ask your doctor what activities are safe for you. Keep all follow-up visits as told by your doctor. This is important. Contact a doctor if: You have another gout attack. You still have symptoms of a gout attack after 10 days of treatment. You have problems (side effects) because of your medicines. You have chills or a fever. You have burning pain when you pee (urinate). You have pain in your lower back or belly. Get help right away if: You have very  bad pain. Your pain cannot be controlled. You cannot pee. Summary Gout is painful swelling of the joints. The most common site of pain is the big toe, but it can affect other joints. Medicines and avoiding some foods can help to prevent and treat gout attacks. This information is not intended to replace advice given to you by your health care provider. Make sure you discuss any questions you have with your health care provider. Document Revised: 04/25/2018 Document Reviewed: 05/07/2018 Elsevier Patient Education  2022 [employer].    If you have been instructed to have an in-person evaluation today at a local Urgent Care facility, please use the link below. It will take you to a list of all of our available [facility], including address, phone number and hours of operation. Please do not delay care.  [facility]  If you or a family member do not have a primary care provider, use the link below to schedule a visit and establish care. When you choose a [facility]  primary care physician or advanced practice provider, you gain a long-term partner in health. Find a Primary Care Provider  Learn more about [facility]'s in-office and virtual care options: [facility] - Get Care Now

## 2021-12-20 DIAGNOSIS — M25561 Pain in right knee: Secondary | ICD-10-CM | POA: Insufficient documentation

## 2021-12-20 DIAGNOSIS — M25562 Pain in left knee: Secondary | ICD-10-CM | POA: Insufficient documentation

## 2021-12-20 NOTE — Progress Notes (Signed)
° °  Virtual Visit via Telephone   I attempted to connect with  [name]  on 12/19/2021 by telephone/telehealth.  Provider location is in medical facility. People involved in care of the patient during this telehealth encounter were myself, my nurse/medical assistant, and my front office/scheduling team member.  Objective Findings:      Independent interpretation of tests performed by another provider:   None.  Brief History, Exam, Impression, and Recommendations:    Attempted to connect with patient via MyChart video visit at 4:50 PM on 12/19/2021, however after remaining in lobby for 15 minutes, patient did not join virtual visit.   I provided 5 minutes of face to face and non-face-to-face time during this encounter date, time was needed to gather information, review chart, records, communicate/coordinate with staff remotely, as well as complete documentation.   ___________________________________________ Jazmon Kos de [place], MD, ABFM, CAQSM Primary Care and Sports Medicine [facility]

## 2022-02-05 ENCOUNTER — Ambulatory Visit (INDEPENDENT_AMBULATORY_CARE_PROVIDER_SITE_OTHER): Payer: BC Managed Care – PPO | Admitting: Family Medicine

## 2022-02-05 ENCOUNTER — Encounter (HOSPITAL_BASED_OUTPATIENT_CLINIC_OR_DEPARTMENT_OTHER): Payer: Self-pay | Admitting: Family Medicine

## 2022-02-05 VITALS — BP 172/118 | HR 94 | Temp 97.8°F | Ht 79.0 in | Wt 309.0 lb

## 2022-02-05 DIAGNOSIS — Z23 Encounter for immunization: Secondary | ICD-10-CM

## 2022-02-05 DIAGNOSIS — I10 Essential (primary) hypertension: Secondary | ICD-10-CM

## 2022-02-05 DIAGNOSIS — E782 Mixed hyperlipidemia: Secondary | ICD-10-CM

## 2022-02-05 DIAGNOSIS — Z8739 Personal history of other diseases of the musculoskeletal system and connective tissue: Secondary | ICD-10-CM

## 2022-02-05 DIAGNOSIS — Z0000 Encounter for general adult medical examination without abnormal findings: Secondary | ICD-10-CM | POA: Diagnosis not present

## 2022-02-05 DIAGNOSIS — M109 Gout, unspecified: Secondary | ICD-10-CM

## 2022-02-05 DIAGNOSIS — Z800 Family history of malignant neoplasm of digestive organs: Secondary | ICD-10-CM | POA: Insufficient documentation

## 2022-02-05 MED ORDER — AMLODIPINE BESYLATE 5 MG PO TABS: 5.0000 mg | ORAL_TABLET | Freq: Every day | ORAL | 1 refills | Status: DC

## 2022-02-05 MED ORDER — ROSUVASTATIN CALCIUM 5 MG PO TABS: 5.0000 mg | ORAL_TABLET | Freq: Every day | ORAL | 1 refills | Status: DC

## 2022-02-05 MED ORDER — CYCLOBENZAPRINE HCL 10 MG PO TABS
10.0000 mg | ORAL_TABLET | Freq: Three times a day (TID) | ORAL | 1 refills | Status: DC | PRN
Start: 2022-02-05 — End: 2022-07-10

## 2022-02-05 MED ORDER — PREDNISONE 10 MG PO TABS: ORAL_TABLET | ORAL | 0 refills | Status: DC

## 2022-02-05 MED ORDER — SHINGRIX 50 MCG/0.5ML IM SUSR: 0.5000 mL | Freq: Once | INTRAMUSCULAR | 0 refills | Status: AC

## 2022-02-05 NOTE — Progress Notes (Signed)
?Subjective:   ? ?CC: Annual Physical Exam ? ?HPI:  ?Darrell King is a 56 y.o. presenting for annual physical ? ?I reviewed the past medical history, family history, social history, surgical history, and allergies today and no changes were needed.  Please see the problem list section below in epic for further details. ? ?Past Medical History: ?Past Medical History:  ?Diagnosis Date  ? Acute gout 12/31/2016  ? Anxiety   ? "had taken Paxil; back in 1996; nothing since" (10/21/2013)  ? Arthritis   ? Asthma   ? asa child  ? Complication of anesthesia   ? SLOW TO WAKE UP AFTER SURGERY IN 2019  ? Dyslipidemia 03/11/2016  ? 12.5% 10-year risk of heart disease or stroke, should be on a moderate/high intensity statin.    ? Encounter for hepatitis C screening test for low risk patient 02/02/2021  ? Essential hypertension, benign 02/23/2016  ? Refusal of blood transfusions as patient is Jehovah's Witness   ? ?Past Surgical History: ?Past Surgical History:  ?Procedure Laterality Date  ? COLONOSCOPY W/ POLYPECTOMY    ? INGUINAL HERNIA REPAIR Right 1995  ? INGUINAL HERNIA REPAIR Left 11/12/2013  ? Procedure: HERNIA REPAIR INGUINAL INCARCERATED;  Surgeon: Adolph Pollack, MD;  Location: [facility] OR;  Service: General;  Laterality: Left;  ? SHOULDER ARTHROSCOPY WITH OPEN ROTATOR CUFF REPAIR AND DISTAL CLAVICLE ACROMINECTOMY Right 06/02/2019  ? Procedure: right shoulder arthroscopy , mini open rotator cuff repair;  Surgeon: Cammy Copa, MD;  Location: [facility] OR;  Service: Orthopedics;  Laterality: Right;  ? SHOULDER ARTHROSCOPY WITH SUBACROMIAL DECOMPRESSION, ROTATOR CUFF REPAIR AND BICEP TENDON REPAIR Right 07/14/2018  ? Procedure: RIGHT SHOULDER ARTHROSCOPY, DEBRIDEMENT, BICEPS TENODESIS, DISTAL CLAVICLE EXCISION AND MINI OPEN ROTATOR CUFF TEAR REPAIR;  Surgeon: Cammy Copa, MD;  Location: [facility] OR;  Service: Orthopedics;  Laterality: Right;  ? ?Social History: ?Social History  ? ?Socioeconomic History  ? Marital status:  Married  ?  Spouse name: Not on file  ? Number of children: 3  ? Years of education: Not on file  ? Highest education level: Not on file  ?Occupational History  ? Occupation: Maintenance Tech  ?Tobacco Use  ? Smoking status: Never  ? Smokeless tobacco: Never  ?Vaping Use  ? Vaping Use: Never used  ?Substance and Sexual Activity  ? Alcohol use: Yes  ?  Alcohol/week: 1.0 - 2.0 standard drink  ?  Types: 1 - 2 Shots of liquor per week  ?  Comment: occasionally.  ? Drug use: No  ? Sexual activity: Yes  ?  Birth control/protection: None  ?Other Topics Concern  ? Not on file  ?Social History Narrative  ? Enjoys bowling  ? Works as a Curator  ? ?Social Determinants of Health  ? ?Financial Resource Strain: Not on file  ?Food Insecurity: Not on file  ?Transportation Needs: Not on file  ?Physical Activity: Not on file  ?Stress: Not on file  ?Social Connections: Not on file  ? ?Family History: ?Family History  ?Problem Relation Age of Onset  ? Colon cancer Mother   ?     colon  ? Cancer Mother   ? Cancer Sister   ?     dont know what type  ? ?Allergies: ?No Known Allergies ?Medications: See med rec. ? ?Review of Systems: No headache, visual changes, nausea, vomiting, diarrhea, constipation, dizziness, abdominal pain, skin rash, fevers, chills, night sweats, swollen lymph nodes, weight loss, chest pain, body aches, joint swelling, muscle  aches, shortness of breath, mood changes, visual or auditory hallucinations. ? ?Objective:   ? ?BP (!) 172/118   Pulse 94   Temp 97.8 ?F (36.6 ?C)   Ht 6\' 7"  (2.007 m)   Wt (!) 309 lb (140.2 kg)   SpO2 98%   BMI 34.81 kg/m?  ? ?General: Well Developed, well nourished, and in no acute distress.  ?Neuro: Alert and oriented x3, extra-ocular muscles intact, sensation grossly intact. Cranial nerves II through XII are intact, motor, sensory, and coordinative functions are all intact. ?HEENT: Normocephalic, atraumatic, pupils equal round reactive to light, neck supple, no masses, no  lymphadenopathy, thyroid nonpalpable. Oropharynx, nasopharynx, external ear canals are unremarkable. ?Skin: Warm and dry, no rashes noted.  ?Cardiac: Regular rate and rhythm, no murmurs rubs or gallops.  ?Respiratory: Clear to auscultation bilaterally. Not using accessory muscles, speaking in full sentences.  ?Abdominal: Soft, nontender, nondistended, positive bowel sounds, no masses, no organomegaly.  ?Musculoskeletal: Shoulder, elbow, wrist, hip, knee, ankle stable, and with full range of motion. ? ?Impression and Recommendations:   ? ?Wellness examination ?Routine HCM labs ordered. HCM reviewed/discussed. Anticipatory guidance regarding healthy weight, lifestyle and choices given. ?Recommend healthy diet.  Recommend approximately 150 minutes/week of moderate intensity exercise ?Recommend regular dental and vision exams ?Always use seatbelt/lap and shoulder restraints ?Recommend using smoke alarms and checking batteries at least twice a year ?Recommend using sunscreen when outside ?Discussed recommendations for shingles vaccine, patient amenable, prescription sent to pharmacy ?Patient UTD on colon cancer screening, tetanus ? ?Patient with acute left knee pain, and foot/toe pain - history of gout based on clinic exam/history in the past. Has not had definitive joint aspiration/crystal analysis. Has had one prior UA drawn with result of 6.7. Has denied other draws in the past, has not been on any control medications. ?Prescription for prednisone taper sent to pharmacy ?UA to be checked with labs ?May need to consider allopurinol ? ?Plan for follow-up in about 4-6 weeks to follow-up on HTN ? ? ?___________________________________________ ?Keeshawn Fakhouri de , MD, ABFM, CAQSM ?Primary Care and Sports Medicine ?[facility] ?

## 2022-02-05 NOTE — Assessment & Plan Note (Signed)
Routine HCM labs ordered. HCM reviewed/discussed. Anticipatory guidance regarding healthy weight, lifestyle and choices given. ?Recommend healthy diet.  Recommend approximately 150 minutes/week of moderate intensity exercise ?Recommend regular dental and vision exams ?Always use seatbelt/lap and shoulder restraints ?Recommend using smoke alarms and checking batteries at least twice a year ?Recommend using sunscreen when outside ?Discussed recommendations for shingles vaccine, patient amenable, prescription sent to pharmacy ?Patient UTD on colon cancer screening, tetanus ?

## 2022-02-05 NOTE — Patient Instructions (Signed)

## 2022-02-06 LAB — LIPID PANEL
Chol/HDL Ratio: 7.2 ratio — ABNORMAL HIGH (ref 0.0–5.0)
Cholesterol, Total: 222 mg/dL — ABNORMAL HIGH (ref 100–199)
HDL: 31 mg/dL — ABNORMAL LOW (ref >39)
LDL Chol Calc (NIH): 127 mg/dL — ABNORMAL HIGH (ref 0–99)
Triglycerides: 361 mg/dL — ABNORMAL HIGH (ref 0–149)
VLDL Cholesterol Cal: 64 mg/dL — ABNORMAL HIGH (ref 5–40)

## 2022-02-06 LAB — CBC WITH DIFFERENTIAL/PLATELET
Basophils Absolute: 0 10*3/uL (ref 0.0–0.2)
Basos: 1 %
EOS (ABSOLUTE): 0.2 10*3/uL (ref 0.0–0.4)
Eos: 3 %
Hematocrit: 49.2 % (ref 37.5–51.0)
Hemoglobin: 16.7 g/dL (ref 13.0–17.7)
Immature Grans (Abs): 0 10*3/uL (ref 0.0–0.1)
Immature Granulocytes: 0 %
Lymphocytes Absolute: 2.1 10*3/uL (ref 0.7–3.1)
Lymphs: 36 %
MCH: 28.3 pg (ref 26.6–33.0)
MCHC: 33.9 g/dL (ref 31.5–35.7)
MCV: 83 fL (ref 79–97)
Monocytes Absolute: 0.6 10*3/uL (ref 0.1–0.9)
Monocytes: 10 %
Neutrophils Absolute: 2.9 10*3/uL (ref 1.4–7.0)
Neutrophils: 50 %
Platelets: 217 10*3/uL (ref 150–450)
RBC: 5.91 x10E6/uL — ABNORMAL HIGH (ref 4.14–5.80)
RDW: 14.1 % (ref 11.6–15.4)
WBC: 5.9 10*3/uL (ref 3.4–10.8)

## 2022-02-06 LAB — COMPREHENSIVE METABOLIC PANEL
ALT: 39 IU/L (ref 0–44)
AST: 26 IU/L (ref 0–40)
Albumin/Globulin Ratio: 1.6 (ref 1.2–2.2)
Albumin: 4.5 g/dL (ref 3.8–4.9)
Alkaline Phosphatase: 80 IU/L (ref 44–121)
BUN/Creatinine Ratio: 9 (ref 9–20)
BUN: 11 mg/dL (ref 6–24)
Bilirubin Total: 0.5 mg/dL (ref 0.0–1.2)
CO2: 24 mmol/L (ref 20–29)
Calcium: 9.8 mg/dL (ref 8.7–10.2)
Chloride: 103 mmol/L (ref 96–106)
Creatinine, Ser: 1.22 mg/dL (ref 0.76–1.27)
Globulin, Total: 2.8 g/dL (ref 1.5–4.5)
Glucose: 95 mg/dL (ref 70–99)
Potassium: 4.4 mmol/L (ref 3.5–5.2)
Sodium: 140 mmol/L (ref 134–144)
Total Protein: 7.3 g/dL (ref 6.0–8.5)
eGFR: 70 mL/min/{1.73_m2} (ref >59)

## 2022-02-06 LAB — URIC ACID: Uric Acid: 7.9 mg/dL (ref 3.8–8.4)

## 2022-02-06 LAB — HEMOGLOBIN A1C
Est. average glucose Bld gHb Est-mCnc: 111 mg/dL
Hgb A1c MFr Bld: 5.5 % (ref 4.8–5.6)

## 2022-02-06 LAB — TSH RFX ON ABNORMAL TO FREE T4: TSH: 2.01 u[IU]/mL (ref 0.450–4.500)

## 2022-02-08 ENCOUNTER — Other Ambulatory Visit (HOSPITAL_BASED_OUTPATIENT_CLINIC_OR_DEPARTMENT_OTHER): Payer: Self-pay | Admitting: Family Medicine

## 2022-02-08 DIAGNOSIS — M109 Gout, unspecified: Secondary | ICD-10-CM

## 2022-02-28 ENCOUNTER — Telehealth: Payer: BC Managed Care – PPO | Admitting: Physician Assistant

## 2022-02-28 DIAGNOSIS — B029 Zoster without complications: Secondary | ICD-10-CM

## 2022-02-28 MED ORDER — VALACYCLOVIR HCL 1 G PO TABS
1000.0000 mg | ORAL_TABLET | Freq: Three times a day (TID) | ORAL | 0 refills | Status: DC
Start: 2022-02-28 — End: 2022-03-07

## 2022-02-28 MED ORDER — GABAPENTIN 300 MG PO CAPS: 300.0000 mg | ORAL_CAPSULE | Freq: Three times a day (TID) | ORAL | 0 refills | Status: DC

## 2022-02-28 NOTE — Progress Notes (Signed)
I have spent 5 minutes in review of e-visit questionnaire, review and updating patient chart, medical decision making and response to patient.   Lauralie Blacksher Cody Aftyn Nott, PA-C    

## 2022-02-28 NOTE — Progress Notes (Signed)
E-visit for Shingles   We are sorry that you are not feeling well. Here is how we plan to help!  Based on what you shared with me it looks like you have shingles.  Shingles or herpes zoster, is a common infection of the nerves.  It is a painful rash caused by the herpes zoster virus.  This is the same virus that causes chickenpox.  After a person has chickenpox, the virus remains inactive in the nerve cells.  Years later, the virus can become active again and travel to the skin.  It typically will appear on one side of the face or body.  Burning or shooting pain, tingling, or itching are early signs of the infection.  Blisters typically scab over in 7 to 10 days and clear up within 2-4 weeks. Shingles is only contagious to people that have never had the chickenpox, the chickenpox vaccine, or anyone who has a compromised immune system.  You should avoid contact with these type of people until your blisters scab over.  I have prescribed Valacyclovir 1g three times daily for 7 days and also Gabapentin 300mg twice daily as needed for pain   HOME CARE: . Apply ice packs (wrapped in a thin towel), cool compresses, or soak in cool bath to help reduce pain. . Use calamine lotion to calm itchy skin. . Avoid scratching the rash. . Avoid direct sunlight.  GET HELP RIGHT AWAY IF: . Symptoms that don't away after treatment. . A rash or blisters near your eye. . Increased drainage, fever, or rash after treatment. . Severe pain that doesn't go away.   MAKE SURE YOU    Understand these instructions.  Will watch your condition.  Will get help right away if you are not doing well or get worse.  Thank you for choosing an e-visit. Your e-visit answers were reviewed by a board certified advanced clinical practitioner to complete your personal care plan. Depending upon the condition, your plan could have included both over the counter or prescription medications.  Please review your pharmacy choice.  Make sure the pharmacy is open so you can pick up prescription now. If there is a problem, you may contact your provider through MyChart messaging and have the prescription routed to another pharmacy.  Your safety is important to us. If you have drug allergies check your prescription carefully.   For the next 24 hours you can use MyChart to ask questions about today's visit, request a non-urgent call back, or ask for a work or school excuse.  You will get an email in the next two days asking about your experience. I hope that your e-visit has been valuable and will speed your recovery  

## 2022-03-02 ENCOUNTER — Other Ambulatory Visit: Payer: Self-pay

## 2022-03-02 ENCOUNTER — Encounter (HOSPITAL_BASED_OUTPATIENT_CLINIC_OR_DEPARTMENT_OTHER): Payer: Self-pay | Admitting: Family Medicine

## 2022-03-02 ENCOUNTER — Inpatient Hospital Stay (HOSPITAL_BASED_OUTPATIENT_CLINIC_OR_DEPARTMENT_OTHER)
Admission: EM | Admit: 2022-03-02 | Discharge: 2022-03-07 | DRG: 073 | Disposition: A | Discharge status: Home / Self Care | Payer: BC Managed Care – PPO | Attending: Internal Medicine | Admitting: Internal Medicine

## 2022-03-02 ENCOUNTER — Ambulatory Visit (HOSPITAL_BASED_OUTPATIENT_CLINIC_OR_DEPARTMENT_OTHER): Payer: BC Managed Care – PPO | Admitting: Family Medicine

## 2022-03-02 ENCOUNTER — Emergency Department (HOSPITAL_BASED_OUTPATIENT_CLINIC_OR_DEPARTMENT_OTHER): Payer: BC Managed Care – PPO

## 2022-03-02 ENCOUNTER — Inpatient Hospital Stay (HOSPITAL_COMMUNITY): Payer: BC Managed Care – PPO

## 2022-03-02 ENCOUNTER — Encounter (HOSPITAL_BASED_OUTPATIENT_CLINIC_OR_DEPARTMENT_OTHER): Payer: Self-pay | Admitting: Emergency Medicine

## 2022-03-02 VITALS — BP 138/84 | HR 85 | Ht 79.0 in | Wt 308.1 lb

## 2022-03-02 DIAGNOSIS — E860 Dehydration: Secondary | ICD-10-CM | POA: Diagnosis not present

## 2022-03-02 DIAGNOSIS — I10 Essential (primary) hypertension: Secondary | ICD-10-CM | POA: Diagnosis not present

## 2022-03-02 DIAGNOSIS — B020 Zoster encephalitis: Secondary | ICD-10-CM | POA: Diagnosis not present

## 2022-03-02 DIAGNOSIS — E669 Obesity, unspecified: Secondary | ICD-10-CM | POA: Diagnosis not present

## 2022-03-02 DIAGNOSIS — Z79899 Other long term (current) drug therapy: Secondary | ICD-10-CM

## 2022-03-02 DIAGNOSIS — N179 Acute kidney failure, unspecified: Secondary | ICD-10-CM | POA: Diagnosis not present

## 2022-03-02 DIAGNOSIS — Z531 Procedure and treatment not carried out because of patient's decision for reasons of belief and group pressure: Secondary | ICD-10-CM | POA: Diagnosis present

## 2022-03-02 DIAGNOSIS — E876 Hypokalemia: Secondary | ICD-10-CM | POA: Diagnosis not present

## 2022-03-02 DIAGNOSIS — B029 Zoster without complications: Secondary | ICD-10-CM | POA: Diagnosis present

## 2022-03-02 DIAGNOSIS — M109 Gout, unspecified: Secondary | ICD-10-CM | POA: Diagnosis present

## 2022-03-02 DIAGNOSIS — Z20822 Contact with and (suspected) exposure to covid-19: Secondary | ICD-10-CM | POA: Diagnosis present

## 2022-03-02 DIAGNOSIS — Z452 Encounter for adjustment and management of vascular access device: Secondary | ICD-10-CM | POA: Diagnosis not present

## 2022-03-02 DIAGNOSIS — R9431 Abnormal electrocardiogram [ECG] [EKG]: Secondary | ICD-10-CM | POA: Diagnosis not present

## 2022-03-02 DIAGNOSIS — R109 Unspecified abdominal pain: Secondary | ICD-10-CM

## 2022-03-02 DIAGNOSIS — G0490 Encephalitis and encephalomyelitis, unspecified: Secondary | ICD-10-CM

## 2022-03-02 DIAGNOSIS — A879 Viral meningitis, unspecified: Secondary | ICD-10-CM | POA: Diagnosis not present

## 2022-03-02 DIAGNOSIS — B0111 Varicella encephalitis and encephalomyelitis: Secondary | ICD-10-CM | POA: Diagnosis present

## 2022-03-02 DIAGNOSIS — E785 Hyperlipidemia, unspecified: Secondary | ICD-10-CM | POA: Diagnosis not present

## 2022-03-02 DIAGNOSIS — R569 Unspecified convulsions: Secondary | ICD-10-CM | POA: Diagnosis not present

## 2022-03-02 DIAGNOSIS — Z8661 Personal history of infections of the central nervous system: Secondary | ICD-10-CM | POA: Diagnosis not present

## 2022-03-02 DIAGNOSIS — A86 Unspecified viral encephalitis: Secondary | ICD-10-CM

## 2022-03-02 DIAGNOSIS — G40309 Generalized idiopathic epilepsy and epileptic syndromes, not intractable, without status epilepticus: Secondary | ICD-10-CM | POA: Diagnosis not present

## 2022-03-02 DIAGNOSIS — Z800 Family history of malignant neoplasm of digestive organs: Secondary | ICD-10-CM

## 2022-03-02 DIAGNOSIS — G039 Meningitis, unspecified: Secondary | ICD-10-CM | POA: Diagnosis not present

## 2022-03-02 DIAGNOSIS — F419 Anxiety disorder, unspecified: Secondary | ICD-10-CM | POA: Diagnosis present

## 2022-03-02 DIAGNOSIS — I611 Nontraumatic intracerebral hemorrhage in hemisphere, cortical: Secondary | ICD-10-CM | POA: Diagnosis not present

## 2022-03-02 DIAGNOSIS — R4182 Altered mental status, unspecified: Secondary | ICD-10-CM | POA: Diagnosis not present

## 2022-03-02 HISTORY — DX: Unspecified convulsions: R56.9

## 2022-03-02 LAB — CBC WITH DIFFERENTIAL/PLATELET
Abs Immature Granulocytes: 0.03 10*3/uL (ref 0.00–0.07)
Basophils Absolute: 0 10*3/uL (ref 0.0–0.1)
Basophils Absolute: 0 10*3/uL (ref 0.0–0.2)
Basophils Relative: 0 %
Basos: 1 %
EOS (ABSOLUTE): 0.1 10*3/uL (ref 0.0–0.4)
Eos: 1 %
Eosinophils Absolute: 0.1 10*3/uL (ref 0.0–0.5)
Eosinophils Relative: 1 %
HCT: 42.3 % (ref 39.0–52.0)
Hematocrit: 42 % (ref 37.5–51.0)
Hemoglobin: 14.3 g/dL (ref 13.0–17.0)
Hemoglobin: 14.6 g/dL (ref 13.0–17.7)
Immature Grans (Abs): 0 10*3/uL (ref 0.0–0.1)
Immature Granulocytes: 0 %
Immature Granulocytes: 1 %
Lymphocytes Absolute: 1.5 10*3/uL (ref 0.7–3.1)
Lymphocytes Relative: 35 %
Lymphs Abs: 2.2 10*3/uL (ref 0.7–4.0)
Lymphs: 31 %
MCH: 28.4 pg (ref 26.0–34.0)
MCH: 29.1 pg (ref 26.6–33.0)
MCHC: 33.8 g/dL (ref 30.0–36.0)
MCHC: 34.8 g/dL (ref 31.5–35.7)
MCV: 83.9 fL (ref 80.0–100.0)
MCV: 84 fL (ref 79–97)
Monocytes Absolute: 0.6 10*3/uL (ref 0.1–0.9)
Monocytes Absolute: 0.9 10*3/uL (ref 0.1–1.0)
Monocytes Relative: 13 %
Monocytes: 13 %
Neutro Abs: 3.2 10*3/uL (ref 1.7–7.7)
Neutrophils Absolute: 2.7 10*3/uL (ref 1.4–7.0)
Neutrophils Relative %: 50 %
Neutrophils: 54 %
Platelets: 177 10*3/uL (ref 150–450)
Platelets: 178 10*3/uL (ref 150–400)
RBC: 5.01 x10E6/uL (ref 4.14–5.80)
RBC: 5.04 MIL/uL (ref 4.22–5.81)
RDW: 13.5 % (ref 11.5–15.5)
RDW: 13.9 % (ref 11.6–15.4)
WBC: 4.9 10*3/uL (ref 3.4–10.8)
WBC: 6.3 10*3/uL (ref 4.0–10.5)
nRBC: 0 % (ref 0.0–0.2)

## 2022-03-02 LAB — COMPREHENSIVE METABOLIC PANEL
ALT: 26 IU/L (ref 0–44)
ALT: 26 U/L (ref 0–44)
AST: 20 U/L (ref 15–41)
AST: 22 IU/L (ref 0–40)
Albumin/Globulin Ratio: 1.3 (ref 1.2–2.2)
Albumin: 4 g/dL (ref 3.8–4.9)
Albumin: 4.3 g/dL (ref 3.5–5.0)
Alkaline Phosphatase: 51 U/L (ref 38–126)
Alkaline Phosphatase: 61 IU/L (ref 44–121)
Anion gap: 9 (ref 5–15)
BUN/Creatinine Ratio: 7 — ABNORMAL LOW (ref 9–20)
BUN: 10 mg/dL (ref 6–24)
BUN: 12 mg/dL (ref 6–20)
Bilirubin Total: 1.7 mg/dL — ABNORMAL HIGH (ref 0.0–1.2)
CO2: 22 mmol/L (ref 20–29)
CO2: 23 mmol/L (ref 22–32)
Calcium: 9 mg/dL (ref 8.7–10.2)
Calcium: 9.2 mg/dL (ref 8.9–10.3)
Chloride: 101 mmol/L (ref 96–106)
Chloride: 103 mmol/L (ref 98–111)
Creatinine, Ser: 1.35 mg/dL — ABNORMAL HIGH (ref 0.61–1.24)
Creatinine, Ser: 1.4 mg/dL — ABNORMAL HIGH (ref 0.76–1.27)
GFR, Estimated: >60 mL/min (ref >60)
Globulin, Total: 3 g/dL (ref 1.5–4.5)
Glucose, Bld: 126 mg/dL — ABNORMAL HIGH (ref 70–99)
Glucose: 99 mg/dL (ref 70–99)
Potassium: 3.6 mmol/L (ref 3.5–5.1)
Potassium: 4.1 mmol/L (ref 3.5–5.2)
Sodium: 135 mmol/L (ref 135–145)
Sodium: 137 mmol/L (ref 134–144)
Total Bilirubin: 2 mg/dL — ABNORMAL HIGH (ref 0.3–1.2)
Total Protein: 7 g/dL (ref 6.0–8.5)
Total Protein: 7.7 g/dL (ref 6.5–8.1)
eGFR: 60 mL/min/{1.73_m2} (ref >59)

## 2022-03-02 LAB — RESP PANEL BY RT-PCR (FLU A&B, COVID) ARPGX2
Influenza A by PCR: NEGATIVE
Influenza B by PCR: NEGATIVE
SARS Coronavirus 2 by RT PCR: NEGATIVE

## 2022-03-02 LAB — PROTIME-INR
INR: 1 (ref 0.8–1.2)
Prothrombin Time: 13.4 seconds (ref 11.4–15.2)

## 2022-03-02 LAB — LACTIC ACID, PLASMA
Lactic Acid, Venous: 1.5 mmol/L (ref 0.5–1.9)
Lactic Acid, Venous: 1.2 mmol/L (ref 0.5–1.9)

## 2022-03-02 LAB — TROPONIN I (HIGH SENSITIVITY)
Troponin I (High Sensitivity): 3 ng/L (ref <18)
Troponin I (High Sensitivity): 3 ng/L (ref <18)

## 2022-03-02 LAB — CBG MONITORING, ED: Glucose-Capillary: 120 mg/dL — ABNORMAL HIGH (ref 70–99)

## 2022-03-02 MED ORDER — ACYCLOVIR SODIUM 50 MG/ML IV SOLN
INTRAVENOUS | Status: AC
  Filled 2022-03-02: qty 10

## 2022-03-02 MED ORDER — LEVETIRACETAM IN NACL 1000 MG/100ML IV SOLN
3000.0000 mg | Freq: Once | INTRAVENOUS | Status: AC
  Administered 2022-03-02: 3000 mg via INTRAVENOUS
  Filled 2022-03-02: qty 300

## 2022-03-02 MED ORDER — GABAPENTIN 300 MG PO CAPS
300.0000 mg | ORAL_CAPSULE | Freq: Three times a day (TID) | ORAL | Status: DC
  Administered 2022-03-02 – 2022-03-07 (×14): 300 mg via ORAL
  Filled 2022-03-02 (×14): qty 1

## 2022-03-02 MED ORDER — ACETAMINOPHEN 650 MG RE SUPP
650.0000 mg | Freq: Four times a day (QID) | RECTAL | Status: DC | PRN
Start: 2022-03-02 — End: 2022-03-07

## 2022-03-02 MED ORDER — ACYCLOVIR SODIUM 50 MG/ML IV SOLN
1000.0000 mg | Freq: Three times a day (TID) | INTRAVENOUS | Status: DC
Start: 2022-03-02 — End: 2022-03-07
  Administered 2022-03-02 – 2022-03-07 (×15): 1000 mg via INTRAVENOUS
  Filled 2022-03-02 (×20): qty 20

## 2022-03-02 MED ORDER — CEFTRIAXONE SODIUM 2 G IJ SOLR
2.0000 g | Freq: Two times a day (BID) | INTRAMUSCULAR | Status: DC
  Administered 2022-03-02 – 2022-03-04 (×4): 2 g via INTRAVENOUS
  Filled 2022-03-02 (×4): qty 20

## 2022-03-02 MED ORDER — ROSUVASTATIN CALCIUM 5 MG PO TABS
5.0000 mg | ORAL_TABLET | Freq: Every day | ORAL | Status: DC
  Administered 2022-03-02 – 2022-03-07 (×6): 5 mg via ORAL
  Filled 2022-03-02 (×6): qty 1

## 2022-03-02 MED ORDER — HYDROMORPHONE HCL 1 MG/ML IJ SOLN
1.0000 mg | Freq: Once | INTRAMUSCULAR | Status: AC
  Administered 2022-03-02: 1 mg via INTRAVENOUS
  Filled 2022-03-02: qty 1

## 2022-03-02 MED ORDER — ENOXAPARIN SODIUM 40 MG/0.4ML IJ SOSY
40.0000 mg | PREFILLED_SYRINGE | INTRAMUSCULAR | Status: DC
  Administered 2022-03-02: 40 mg via SUBCUTANEOUS
  Filled 2022-03-02: qty 0.4

## 2022-03-02 MED ORDER — LORAZEPAM 2 MG/ML IJ SOLN
2.0000 mg | INTRAMUSCULAR | Status: DC | PRN
  Filled 2022-03-02: qty 1

## 2022-03-02 MED ORDER — SODIUM CHLORIDE 0.9 % IV SOLN: INTRAVENOUS | Status: DC

## 2022-03-02 MED ORDER — HYDROMORPHONE HCL 1 MG/ML IJ SOLN
0.5000 mg | Freq: Once | INTRAMUSCULAR | Status: AC
  Administered 2022-03-02: 0.5 mg via INTRAVENOUS
  Filled 2022-03-02: qty 1

## 2022-03-02 MED ORDER — ACETAMINOPHEN 325 MG PO TABS
650.0000 mg | ORAL_TABLET | Freq: Once | ORAL | Status: AC
  Administered 2022-03-02: 650 mg via ORAL
  Filled 2022-03-02: qty 2

## 2022-03-02 MED ORDER — DEXTROSE 5 % IV SOLN
1000.0000 mg | Freq: Once | INTRAVENOUS | Status: AC
  Administered 2022-03-02: 1000 mg via INTRAVENOUS
  Filled 2022-03-02: qty 20

## 2022-03-02 MED ORDER — KETOROLAC TROMETHAMINE 30 MG/ML IJ SOLN
30.0000 mg | Freq: Four times a day (QID) | INTRAMUSCULAR | Status: DC | PRN
  Administered 2022-03-02 – 2022-03-03 (×2): 30 mg via INTRAVENOUS
  Filled 2022-03-02 (×3): qty 1

## 2022-03-02 MED ORDER — BUTALBITAL-APAP-CAFFEINE 50-325-40 MG PO TABS
1.0000 | ORAL_TABLET | Freq: Four times a day (QID) | ORAL | Status: DC | PRN
  Administered 2022-03-02 – 2022-03-07 (×6): 1 via ORAL
  Filled 2022-03-02 (×6): qty 1

## 2022-03-02 MED ORDER — FENTANYL CITRATE PF 50 MCG/ML IJ SOSY
50.0000 ug | PREFILLED_SYRINGE | Freq: Once | INTRAMUSCULAR | Status: AC
  Administered 2022-03-02: 50 ug via INTRAVENOUS
  Filled 2022-03-02: qty 1

## 2022-03-02 MED ORDER — CYCLOBENZAPRINE HCL 5 MG PO TABS
10.0000 mg | ORAL_TABLET | Freq: Three times a day (TID) | ORAL | Status: DC | PRN
  Administered 2022-03-04 – 2022-03-05 (×4): 10 mg via ORAL
  Filled 2022-03-02 (×5): qty 2

## 2022-03-02 MED ORDER — LACTATED RINGERS IV SOLN: INTRAVENOUS | Status: DC

## 2022-03-02 MED ORDER — AMLODIPINE BESYLATE 5 MG PO TABS
5.0000 mg | ORAL_TABLET | Freq: Every day | ORAL | Status: DC
  Administered 2022-03-02 – 2022-03-07 (×6): 5 mg via ORAL
  Filled 2022-03-02 (×6): qty 1

## 2022-03-02 MED ORDER — LACTATED RINGERS IV SOLN: INTRAVENOUS | Status: AC

## 2022-03-02 MED ORDER — METOCLOPRAMIDE HCL 5 MG/ML IJ SOLN
5.0000 mg | Freq: Once | INTRAMUSCULAR | Status: AC
  Administered 2022-03-02: 5 mg via INTRAVENOUS
  Filled 2022-03-02: qty 2

## 2022-03-02 MED ORDER — LORAZEPAM 2 MG/ML IJ SOLN
0.5000 mg | Freq: Once | INTRAMUSCULAR | Status: AC
  Administered 2022-03-02: 0.5 mg via INTRAVENOUS
  Filled 2022-03-02: qty 1

## 2022-03-02 MED ORDER — SODIUM CHLORIDE 0.9 % IV SOLN
2.0000 g | INTRAVENOUS | Status: DC
Start: 2022-03-02 — End: 2022-03-02
  Administered 2022-03-02: 2 g via INTRAVENOUS
  Filled 2022-03-02: qty 20

## 2022-03-02 MED ORDER — ACETAMINOPHEN 325 MG PO TABS
650.0000 mg | ORAL_TABLET | Freq: Four times a day (QID) | ORAL | Status: DC | PRN
  Administered 2022-03-03 – 2022-03-04 (×3): 650 mg via ORAL
  Filled 2022-03-02 (×4): qty 2

## 2022-03-02 NOTE — ED Notes (Signed)
Report given to [facility] ?

## 2022-03-02 NOTE — Assessment & Plan Note (Signed)
Patient has been having ongoing abdominal pain for most of the week.  Has had some associated nausea, no vomiting.  He has had some constipation.  Did try laxative at home without significant improvement.  Has had decreased appetite, reports that he has been drinking appropriately.  Wife thinks it may have been related to something he ate at the end of last week ?On exam, patient does have some generalized abdominal discomfort, does have rash over right side of upper abdomen as above.  Bowel sounds are normal today. ?Patient appears to be in moderate discomfort.  Vital signs are reassuring, however given his appearance would have low threshold for patient to present to the emergency department ?We will check labs today including CBC and CMP.  Did discuss that if patient has any worsening abdominal pain or begins to have any vomiting, would present to emergency department for further evaluation as urgent imaging would likely be warranted ?

## 2022-03-02 NOTE — Progress Notes (Signed)
? ? ?  Procedures performed today:   ? ?None. ? ?Independent interpretation of notes and tests performed by another provider:  ? ?None. ? ?Brief History, Exam, Impression, and Recommendations:   ? ?BP 138/84   Pulse 85   Ht 6\' 7"  (2.007 m)   Wt (!) 308 lb 1.6 oz (139.8 kg)   SpO2 99%   BMI 34.71 kg/m?  ? ?Herpes zoster ?Patient had recent rash with associated pain develop across right side of trunk, located near lower chest/upper abdomen.  He did have telehealth visit with a provider recently and was diagnosed with shingles.  He was started on valacyclovir and gabapentin.  Has been taking his medications without issue.  Rash consists of some redness as well as lesions along right side of trunk.  Patient has generally done well recently with reported abdominal pain, nausea, some constipation for most of the week.  Wife reports that patient has had "low-grade fever" with a Tmax of 100.4 degrees. ?On exam, there is a rash extending over area of about T9 dermatome along right side of trunk, lower chest wall, upper abdomen.  Diffuse vesicles noted with erythema along this distribution as well. ?Rash does appear to be consistent with shingles, recommend continuing with current management and completing antiviral therapy ?Discussed precautions with wife related to covering the skin, avoidance of exposure to others until lesions are crusted over and reepithelialized ? ?Abdominal pain ?Patient has been having ongoing abdominal pain for most of the week.  Has had some associated nausea, no vomiting.  He has had some constipation.  Did try laxative at home without significant improvement.  Has had decreased appetite, reports that he has been drinking appropriately.  Wife thinks it may have been related to something he ate at the end of last week ?On exam, patient does have some generalized abdominal discomfort, does have rash over right side of upper abdomen as above.  Bowel sounds are normal today. ?Patient appears to be  in moderate discomfort.  Vital signs are reassuring, however given his appearance would have low threshold for patient to present to the emergency department ?We will check labs today including CBC and CMP.  Did discuss that if patient has any worsening abdominal pain or begins to have any vomiting, would present to emergency department for further evaluation as urgent imaging would likely be warranted ? ?Plan for close follow-up in 1 to 2 weeks ? ? ?___________________________________________ ?Jadi Deyarmin de [place], MD, ABFM, CAQSM ?Primary Care and Sports Medicine ?[place] ?

## 2022-03-02 NOTE — ED Provider Notes (Addendum)
?[facility] EMERGENCY DEPT ?Provider Note ? ? ?CSN: [id number] ?Arrival date & time: 03/02/22  9741 ? ?  ? ?History ? ?Chief Complaint  ?Patient presents with  ? Altered Mental Status  ? ? ?Darrell King is a 56 y.o. male. ? ?56 year old male presents with altered mental status.  Recently diagnosed with shingles 2 days ago and was at his doctor's office today for follow-up visit when he became unresponsive.  Had reported 2-minute generalized tonic-clonic seizure activity.  Was postictal following this.  Now complains of headache as well as neck pain.  No reported fever according to the wife.  Has not had any emesis.  His rash has been at the right side of his chest. ? ? ?  ? ?Home Medications ?Prior to Admission medications   ?Medication Sig Start Date End Date Taking? Authorizing Provider  ?amLODipine (NORVASC) 5 MG tablet Take 1 tablet (5 mg total) by mouth daily. 02/05/22   de [place], Raymond J, MD  ?cyclobenzaprine (FLEXERIL) 10 MG tablet Take 1 tablet (10 mg total) by mouth 3 (three) times daily as needed for muscle spasms. 02/05/22   de [place], Raymond J, MD  ?diclofenac Sodium (VOLTAREN) 1 % GEL Apply 4 g topically 4 (four) times daily. 02/06/21   Glenford Bayley, MD  ?gabapentin (NEURONTIN) 300 MG capsule Take 1 capsule (300 mg total) by mouth 3 (three) times daily. 02/28/22   Waldon Merl, PA-C  ?predniSONE (DELTASONE) 10 MG tablet Take 4 tablets (40 mg total) by mouth daily with breakfast for 5 days, THEN 3 tablets (30 mg total) daily with breakfast for 2 days, THEN 2 tablets (20 mg total) daily with breakfast for 2 days, THEN 1 tablet (10 mg total) daily with breakfast for 2 days 02/05/22   de [place], Buren Kos, MD  ?rosuvastatin (CRESTOR) 5 MG tablet Take 1 tablet (5 mg total) by mouth daily. 02/05/22   de [place], Raymond J, MD  ?valACYclovir (VALTREX) 1000 MG tablet Take 1 tablet (1,000 mg total) by mouth 3 (three) times daily for 7 days. 02/28/22 03/07/22  Waldon Merl, PA-C  ?   ? ?Allergies     ?Patient has no known allergies.   ? ?Review of Systems   ?Review of Systems  ?Unable to perform ROS: Acuity of condition  ? ?Physical Exam ?Updated Vital Signs ?There were no vitals taken for this visit. ?Physical Exam ?Vitals and nursing note reviewed.  ?Constitutional:   ?   General: He is not in acute distress. ?   Appearance: Normal appearance. He is well-developed. He is not toxic-appearing.  ?HENT:  ?   Head: Normocephalic and atraumatic.  ?Eyes:  ?   General: Lids are normal.  ?   Conjunctiva/sclera: Conjunctivae normal.  ?   Pupils: Pupils are equal, round, and reactive to light.  ?Neck:  ?   Thyroid: No thyroid mass.  ?   Trachea: No tracheal deviation.  ?Cardiovascular:  ?   Rate and Rhythm: Normal rate and regular rhythm.  ?   Heart sounds: Normal heart sounds. No murmur heard. ?  No gallop.  ?Pulmonary:  ?   Effort: Pulmonary effort is normal. No respiratory distress.  ?   Breath sounds: Normal breath sounds. No stridor. No decreased breath sounds, wheezing, rhonchi or rales.  ?Abdominal:  ?   General: There is no distension.  ?   Palpations: Abdomen is soft.  ?   Tenderness: There is no abdominal tenderness. There is no rebound.  ?Musculoskeletal:     ?  General: No tenderness.  ?   Cervical back: No rigidity. Muscular tenderness present. Decreased range of motion.  ?   Comments: Patient with rash consistent with zoster at approximately  T8-T9 dermatome  ?Skin: ?   General: Skin is warm and dry.  ?   Findings: Rash present. No abrasion.  ?Neurological:  ?   Mental Status: He is lethargic, disoriented and confused.  ?   GCS: GCS eye subscore is 4. GCS verbal subscore is 5. GCS motor subscore is 6.  ?   Cranial Nerves: No cranial nerve deficit.  ?   Sensory: No sensory deficit.  ?   Motor: Motor function is intact.  ?   Comments: Patient moves all 4 extremities at this time.  ?Psychiatric:     ?   Attention and Perception: He is inattentive.  ? ? ?ED Results / Procedures / Treatments   ?Labs ?(all labs  ordered are listed, but only abnormal results are displayed) ?Labs Reviewed  ?CULTURE, BLOOD (ROUTINE X 2)  ?CULTURE, BLOOD (ROUTINE X 2)  ?RESP PANEL BY RT-PCR (FLU A&B, COVID) ARPGX2  ?COMPREHENSIVE METABOLIC PANEL  ?LACTIC ACID, PLASMA  ?LACTIC ACID, PLASMA  ?CBC WITH DIFFERENTIAL/PLATELET  ?PROTIME-INR  ?URINALYSIS, ROUTINE W REFLEX MICROSCOPIC  ? ? ?EKG ?EKG Interpretation ? ?Date/Time:  Friday Mar 02 2022 09:17:28 EDT ?Ventricular Rate:  70 ?PR Interval:  176 ?QRS Duration: 86 ?QT Interval:  382 ?QTC Calculation: 413 ?R Axis:   38 ?Text Interpretation: Sinus rhythm ST elev, probable normal early repol pattern Baseline wander in lead(s) V6 Confirmed by Lorre NickAllen, Raini Tiley ([id number]) on 03/02/2022 10:25:56 AM ? ?Radiology ?No results found. ? ?Procedures ?Procedures  ? ? ?Medications Ordered in ED ?Medications - No data to display ? ?ED Course/ Medical Decision Making/ A&P ?  ?                        ?Medical Decision Making ?Amount and/or Complexity of Data Reviewed ?Labs: ordered. ?Radiology: ordered. ? ?Risk ?OTC drugs. ?Prescription drug management. ?Decision regarding hospitalization. ? ?Patient is EKG per my interpretation shows normal sinus rhythm.  No signs of acute ischemic changes. ?Patient postictal on arrival.  Checked blood sugar and it was 120.  He slowly came around was found to be febrile and given Tylenol.  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  Patient also given Rocephin IV as well 2.  This was performed after consultation with the neurologist, Dr. Amada JupiterKirkpatrick.  Patient also loaded with Keppra 1500 mg IV piggyback per his recommendation.  [name] headache treated with fentanyl.  Patient has large body habitus and has history of difficult LP in the past and therefore will need to have this done under fluoroscopy.  Head CT reviewed and per my review interpretation showed no acute findings.  CBC without leukocytosis here.  Lactate normal.  Chest x-ray without acute findings  per my interpretation.  Blood cultures have been obtained.  Plan [facility] to facilitate neurology involvement in patient's case ? ?CRITICAL CARE ?Performed by: Toy BakerAnthony T Agness Sibrian ?Total critical care time: 55 minutes ?Critical care time was exclusive of separately billable procedures and treating other patients. ?Critical care was necessary to treat or prevent imminent or life-threatening deterioration. ?Critical care was time spent personally by me on the following activities: development of treatment plan with patient and/or surrogate as well as nursing, discussions with consultants, evaluation of patient's  response to treatment, examination of patient, obtaining history from patient or surrogate, ordering and performing treatments and interventions, ordering and review of laboratory studies, ordering and review of radiographic studies, pulse oximetry and re-evaluation of patient's condition. ? ? ? ? ? ? ? ?Final Clinical Impression(s) / ED Diagnoses ?Final diagnoses:  ?None  ? ? ?Rx / DC Orders ?ED Discharge Orders   ? ? None  ? ?  ? ? ?  ?Lorre Nick, MD ?03/02/22 1026 ? ?  ?Lorre Nick, MD ?03/02/22 1045 ? ?

## 2022-03-02 NOTE — Progress Notes (Signed)
Pharmacy Antibiotic Note [name] is a 55 y.o. 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 Of note, recent outpatient electronic visit on 02/28/22 for rash that was found be shingles and given 7 day supply of valacyclovir. On presentation, WBC WNL and Tmax 100.9 F. Ceftriaxone 2g q12h started in ED. Pharmacy has been consulted for acyclovir dosing. ? ?Plan: ?Started acyclovir IV 1000mg  q8h  ?Continue LR @125mL /h  ?F/u renal function, clinical course, and length of therapy ? ?  ? ?Temp (24hrs), Avg:100.9 ?F (38.3 ?C), Min:100.9 ?F (38.3 ?C), Max:100.9 ?F (38.3 ?C) ? ?Recent Labs  ?Lab 03/02/22 ?0924  ?WBC 6.3  ?  ?CrCl cannot be calculated ([name] most recent lab result is older than the maximum 21 days allowed.).   ? ?No Known Allergies ? ?Antimicrobials this admission: ?Acyclovir 5/5 > ?Ceftriaxone 5/5 > ? ?Dose adjustments this admission: ? ? ?Microbiology results: ?5/5 BCx: sent ? ?Thank you for allowing pharmacy to be a part of this patient?s care. ? ? ?03/02/2022 9:51 AM ? ?

## 2022-03-02 NOTE — Patient Instructions (Signed)
?  Medication Instructions:  Your physician recommends that you continue on your current medications as directed. Please refer to the Current Medication list given to you today. --If you need a refill on any your medications before your next appointment, please call your pharmacy first. If no refills are authorized on file call the office.-- Lab Work: Your physician has recommended that you have lab work today: Yes If you have labs (blood work) drawn today and your tests are completely normal, you will receive your results via MyChart message OR a phone call from our staff.  Please ensure you check your voicemail in the event that you authorized detailed messages to be left on a delegated number. If you have any lab test that is abnormal or we need to change your treatment, we will call you to review the results.  Referrals/Procedures/Imaging: No  Follow-Up: Your next appointment:   Your physician recommends that you schedule a follow-up appointment in: 1-2 weeks with Dr. de [place].  You will receive a text message or e-mail with a link to a survey about your care and experience with us today! We would greatly appreciate your feedback!   Thanks for letting us be apart of your health journey!!  Primary Care and Sports Medicine   Dr. Raymond de Cuba   We encourage you to activate your patient portal called "MyChart".  Sign up information is provided on this After Visit Summary.  MyChart is used to connect with patients for Virtual Visits (Telemedicine).  Patients are able to view lab/test results, encounter notes, upcoming appointments, etc.  Non-urgent messages can be sent to your provider as well. To learn more about what you can do with MyChart, please visit --  [email or web address].    

## 2022-03-02 NOTE — Assessment & Plan Note (Signed)
Patient had recent rash with associated pain develop across right side of trunk, located near lower chest/upper abdomen.  He did have telehealth visit with a provider recently and was diagnosed with shingles.  He was started on valacyclovir and gabapentin.  Has been taking his medications without issue.  Rash consists of some redness as well as lesions along right side of trunk.  Patient has generally done well recently with reported abdominal pain, nausea, some constipation for most of the week.  Wife reports that patient has had "low-grade fever" with a Tmax of 100.4 degrees. ?On exam, there is a rash extending over area of about T9 dermatome along right side of trunk, lower chest wall, upper abdomen.  Diffuse vesicles noted with erythema along this distribution as well. ?Rash does appear to be consistent with shingles, recommend continuing with current management and completing antiviral therapy ?Discussed precautions with wife related to covering the skin, avoidance of exposure to others until lesions are crusted over and reepithelialized ?

## 2022-03-02 NOTE — ED Triage Notes (Signed)
Pt arrives to ED unresponsive with wife POV with possible seizure-like actively.  ?

## 2022-03-02 NOTE — Progress Notes (Signed)
Plan of Care Note for accepted transfer ? ? ?Patient: Darrell King MRN: [id number]   DOA: 03/02/2022 ? ?Facility requesting transfer: [facility] ?Requesting Provider: Freida Busman ?Reason for transfer: Seizure ?Facility course: Patient with h/o HTN and HLD who is Je[employer]'s Witness who had an e-visit on 5/3 and was diagnosed with shingles (pictures were included, but in my review the pictures are not diagnostic).  He was given Valtrex and Neurontin.  He went for f/u with his PCP today and became unresponsive followed by 2 minutes of GTC seizure-like activity.   Per Dr. 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  Needs LP, has had issues with this before and so will refusing except under fluoro (I have reached out to IR to ensure that they are ok with this and that the EDP does not have to attempt LP first).  Neurology is involved.  He has been loaded with Keppra, Rocephin, and Acyclovir.  He is currently awake and appropriate and does not appear to need ICU-level care, per EDP. ? ? ?Plan of care: ?The patient is accepted for admission to Telemetry unit, at [facility]. ? ? ?Author: ?Jonah Blue, MD ?03/02/2022 ? ?Check [email or web address] for on-call coverage. ? ?Nursing staff, Please call [facility] Admits & Consults System-Wide number on Amion as soon as patient's arrival, so appropriate admitting provider can evaluate the pt. ?

## 2022-03-02 NOTE — Consult Note (Signed)
?                    NEURO HOSPITALIST CONSULT NOTE  ? ?Requestig physician: Dr. Chipper Herb ? ?Reason for Consult: First time seizure in the context of recent shingles infection ? ?History obtained from:  Patient, Wife and Chart    ? ?HPI:                                                                                                                                         ? Darrell King is a 56 y.o. male with a right T7 dermatome shingles rash first noticed on Wednesday, who presented to the ED on Friday morning after a seizure occurring at his doctor's office. He was sitting in a chair when per wife he suddenly started staring blankly, eyes deviated to the right and limbs then started jerking, with generalized tonic-clonic seizure activity. The seizure lasted for about 2 minutes, followed by an approximately 10 minute long postictal period. On arrival to the ED, he complained of headache and neck pain. CBG was 120. CBC showed no leukocytosis. He was not having a fever per wife, had no recent vomiting and had no focal weakness or sensory changes other than numbness and pain at the location of the shingles rash. A bedside LP was considered, but due to large body habitus ED Physician felt that it would need to be done under fluoro. 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 He was loaded with 3000 mg IV Keppra.  ? ?At the time of bedside neurological evaluation, the patient states that his cognition is normal, with no speech deficit or confusion. Has had no vision changes. He also denies focal weakness although exam reveals left sided motor deficit (see below).  ? ?Of note, the patient's PMHx includes aseptic meningitis in 2014. He also has HTN, HLD, gout and anxiety. 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  ? ?Past Medical History:  ?Diagnosis Date  ? Acute gout 12/31/2016  ? Anxiety   ? "had taken Paxil; back in 1996;  nothing since" (10/21/2013)  ? Arthritis   ? Complication of anesthesia   ? SLOW TO WAKE UP AFTER SURGERY IN 2019  ? Dyslipidemia 03/11/2016  ? 12.5% 10-year risk of heart disease or stroke, should be on a moderate/high intensity statin.    ? Encounter for hepatitis C screening test for low risk patient 02/02/2021  ? Essential hypertension, benign 02/23/2016  ? 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 Refusal of blood transfusions as patient is Jehovah's Witness   ? ? ?Past Surgical History:  ?Procedure Laterality Date  ? COLONOSCOPY W/ POLYPECTOMY    ? INGUINAL HERNIA REPAIR Right 1995  ? INGUINAL HERNIA REPAIR Left 11/12/2013  ? Procedure: HERNIA REPAIR INGUINAL INCARCERATED;  Surgeon: Tawanna Cooler  Laurie PandaJ Rosenbower, MD;  Location: [facility] OR;  Service: General;  Laterality: Left;  ? SHOULDER ARTHROSCOPY WITH OPEN ROTATOR CUFF REPAIR AND DISTAL CLAVICLE ACROMINECTOMY Right 06/02/2019  ? Procedure: right shoulder arthroscopy , mini open rotator cuff repair;  Surgeon: Cammy Copaean, Gregory Scott, MD;  [facility];  Service: Orthopedics;  Laterality: Right;  ? SHOULDER ARTHROSCOPY WITH SUBACROMIAL DECOMPRESSION, ROTATOR CUFF REPAIR AND BICEP TENDON REPAIR Right 07/14/2018  ? Procedure: RIGHT SHOULDER ARTHROSCOPY, DEBRIDEMENT, BICEPS TENODESIS, DISTAL CLAVICLE EXCISION AND MINI OPEN ROTATOR CUFF TEAR REPAIR;  Surgeon: Cammy Copaean, Gregory Scott, MD;  Location: [facility] OR;  Service: Orthopedics;  Laterality: Right;  ? ? ?Family History  ?Problem Relation Age of Onset  ? Colon cancer Mother   ?     colon  ? Cancer Mother   ? Cancer Sister   ?     dont know what type  ?         ? ?Social History:  reports that he has never smoked. He has never used smokeless tobacco. He reports current alcohol use of about 1.0 - 2.0 standard drink per week. He reports that he does not use drugs. ? ?No Known Allergies ? ?MEDICATIONS:                                                                                                                     ?Prior to Admission:   ?Medications Prior to Admission  ?Medication Sig Dispense Refill Last Dose  ? acetaminophen (TYLENOL) 500 MG tablet Take 1,000 mg by mouth every 6 (six) hours as needed for mild pain.   03/01/2022 at pm  ? amLODipine (NORVASC) 5 MG tablet Take 1 tablet (5 mg total) by mouth daily. 90 tablet 1 03/01/2022  ? cyclobenzaprine (FLEXERIL) 10 MG tablet Take 1 tablet (10 mg total) by mouth 3 (three) times daily as needed for muscle spasms. 90 tablet 1 unk  ? gabapentin (NEURONTIN) 300 MG capsule Take 1 capsule (300 mg total) by mouth 3 (three) times daily. 30 capsule 0 03/01/2022 at pm  ? rosuvastatin (CRESTOR) 5 MG tablet Take 1 tablet (5 mg total) by mouth daily. 90 tablet 1 03/01/2022  ? valACYclovir (VALTREX) 1000 MG tablet Take 1 tablet (1,000 mg total) by mouth 3 (three) times daily for 7 days. 21 tablet 0 03/01/2022  ? diclofenac Sodium (VOLTAREN) 1 % GEL Apply 4 g topically 4 (four) times daily. (Patient not taking: Reported on 03/02/2022) 350 g 1 Not Taking  ? predniSONE (DELTASONE) 10 MG tablet Take 4 tablets (40 mg total) by mouth daily with breakfast for 5 days, THEN 3 tablets (30 mg total) daily with breakfast for 2 days, THEN 2 tablets (20 mg total) daily with breakfast for 2 days, THEN 1 tablet (10 mg total) daily with breakfast for 2 days (Patient not taking: Reported on 03/02/2022) 32 tablet 0 Not Taking  ? ?Scheduled: ? amLODipine  5 mg Oral Daily  ? enoxaparin (LOVENOX) injection  40 mg Subcutaneous Q24H  ? gabapentin  300 mg Oral TID  ? rosuvastatin  5 mg Oral Daily  ? ?Continuous: ? acyclovir 1,000 mg (03/02/22 2113)  ? cefTRIAXone (ROCEPHIN)  IV 2 g (03/02/22 2244)  ? lactated ringers 125 mL/hr at 03/02/22 4158  ? ? ? ?ROS:                                                                                                                                       ?Endorses left sided weakness. Mentation currently at baseline. Right sided shingles rash with sensory changes. Has LLE soreness. Other ROS as per HPI.   ? ? ?Blood pressure 126/80, pulse 68, temperature 97.6 ?F (36.4 ?C), temperature source Oral, resp. rate 14, SpO2 98 %. ? ? ?General Examination:                                                                                                      ? ?Physical Exam  ?HEENT-  [place]/AT    ?Lungs- Respirations unlabored ?Abdomen/Thorax: Right T7 dermatome crusting and weeping red rash ?Extremities- No edema ? ?Neurological Examination ?Mental Status: Awake and alert. Fully oriented. Speech fluent with intact comprehension and naming. No dysarthria. Able to follow all commands without difficulty. ?Cranial Nerves: ?II: Temporal visual fields intact with no extinction to DSS. PERRL.   ?III,IV, VI: EOMI. No ptosis. No nystagmus.  ?V: FT sensation equal bilaterally. Positive for extinction on the right  ?VII: Smile symmetric ?VIII: Hearing intact to voice ?IX,X: No hypophonia or hoarseness ?XI: Head is midline.  ?XII: Midline tongue extension ?Motor: ?RUE and RLE 5/5 ?LUE 4/5 proximally and distally ?LLE 4+/5 proximally and distally ?No pronator drift.  ?Sensory: Intact FT sensation to BUE and BLE. Positive for extinction to the right upper extremity with DSS ?Deep Tendon Reflexes: 2+ and symmetric throughout ?Cerebellar: No ataxia with FNF bilaterally  ?Gait: Deferred ?  ?Lab Results: ?Basic Metabolic Panel: ?Recent Labs  ?Lab 03/02/22 ?0924  ?NA 135  ?K 3.6  ?CL 103  ?CO2 23  ?GLUCOSE 126*  ?BUN 12  ?CREATININE 1.35*  ?CALCIUM 9.2  ? ? ?CBC: ?Recent Labs  ?Lab 03/02/22 ?0924  ?WBC 6.3  ?NEUTROABS 3.2  ?HGB 14.3  ?HCT 42.3  ?MCV 83.9  ?PLT 178  ? ? ?Cardiac Enzymes: ?No results for input(s): CKTOTAL, CKMB, CKMBINDEX, TROPONINI in the last 168 hours. ? ?Lipid Panel: ?No results for input(s): CHOL, TRIG, HDL, CHOLHDL, VLDL, LDLCALC in the last 168 hours. ? ?Imaging: ?CT Head Wo Contrast ? ?Result  Date: 03/02/2022 ?CLINICAL DATA:  56 year old male with altered mental status. EXAM: CT HEAD WITHOUT CONTRAST TECHNIQUE: Contiguous  axial images were obtained from the base of the skull through the vertex without intravenous contrast. RADIATION DOSE REDUCTION: This exam was performed according to the departmental dose-optimization program which includes

## 2022-03-02 NOTE — H&P (Signed)
?History and Physical  ? ? ?Darrell King:[id number] DOB: 02/09/1966 DOA: 03/02/2022 ? ?PCP: de [place], Buren Kos, MD (Confirm with patient/family/NH records and if not entered, this has to be entered at [facility] point of entry) ?Patient coming from: Home ? ?I have personally briefly reviewed patient's old medical records in [facility] ? ?Chief Complaint: Headache ? ?HPI: Darrell King is a 56 y.o. male with medical history significant of remote aseptic meningitis in 2014, HTN, HLD, obesity, presented with new onset of fever, mentation changes and new onset of seizure. ? ?Patient developed right flank shingles infection last week and was started on valacyclovir 3 days ago after a telemedicine visit.  Last night, patient spiked high fever 104 at home, he said that he was lying on the floor in the sweat off the fever".  He also has had a epigastric pain associated with right flank pain and feeling nauseous the whole week.  Patient went to PCP for office visit today, and during the visit, patient suddenly became unresponsive and has had tonic-clonic like activity.  And PCP sent him to ED. ? ?ED Course: At ED, patient continued to show signs of altered mentations, confused and lethargic.  WBC within normal limits.  CT head negative for acute findings.  Neurology was consulted and recommend lumbar puncture.  However, patient and his pulse had difficulty with lumbar puncture, only successful with fluoroscope guided IR LP.  Neurology recommends starting patient on antiencephalitis and antimeningitis treatment and transferred to [facility] for IR guided lumbar puncture. ? ?Sometimes before the transfer, patient mentation significantly improved, patient remembered that in the morning at the PCP office, he was " having severe headache suddenly blacked out" denies any photophobia, no neck pain, denied any chest pain shortness breath abdominal pain or diarrhea or dysuria.  He reported that 7-8 years ago he developed meningitis  after tick bite.  Denies any tick bites this time. ? ?Review of Systems: As per HPI otherwise 14 point review of systems negative.  ? ? ?Past Medical History:  ?Diagnosis Date  ? Acute gout 12/31/2016  ? Anxiety   ? "had taken Paxil; back in 1996; nothing since" (10/21/2013)  ? Arthritis   ? Complication of anesthesia   ? SLOW TO WAKE UP AFTER SURGERY IN 2019  ? Dyslipidemia 03/11/2016  ? 12.5% 10-year risk of heart disease or stroke, should be on a moderate/high intensity statin.    ? Encounter for hepatitis C screening test for low risk patient 02/02/2021  ? Essential hypertension, benign 02/23/2016  ? 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 Refusal of blood transfusions as patient is Jehovah's Witness   ? ? ?Past Surgical History:  ?Procedure Laterality Date  ? COLONOSCOPY W/ POLYPECTOMY    ? INGUINAL HERNIA REPAIR Right 1995  ? INGUINAL HERNIA REPAIR Left 11/12/2013  ? Procedure: HERNIA REPAIR INGUINAL INCARCERATED;  Surgeon: Adolph Pollack, MD;  Location: [facility] OR;  Service: General;  Laterality: Left;  ? SHOULDER ARTHROSCOPY WITH OPEN ROTATOR CUFF REPAIR AND DISTAL CLAVICLE ACROMINECTOMY Right 06/02/2019  ? Procedure: right shoulder arthroscopy , mini open rotator cuff repair;  Surgeon: Cammy Copa, MD;  Location: [facility] OR;  Service: Orthopedics;  Laterality: Right;  ? SHOULDER ARTHROSCOPY WITH SUBACROMIAL DECOMPRESSION, ROTATOR CUFF REPAIR AND BICEP TENDON REPAIR Right 07/14/2018  ? Procedure: RIGHT SHOULDER ARTHROSCOPY, DEBRIDEMENT, BICEPS TENODESIS, DISTAL CLAVICLE EXCISION AND MINI OPEN ROTATOR CUFF TEAR REPAIR;  Surgeon: Cammy Copa, MD;  Location: [facility] OR;  Service:  Orthopedics;  Laterality: Right;  ? ? ? reports that he has never smoked. He has never used smokeless tobacco. He reports current alcohol use of about 1.0 - 2.0 standard drink per week. He reports that he does not use drugs. ? ?No Known Allergies ? ?Family History  ?Problem Relation Age of Onset  ? Colon cancer Mother   ?     colon  ?  Cancer Mother   ? Cancer Sister   ?     dont know what type  ? ? ? ?Prior to Admission medications   ?Medication Sig Start Date End Date Taking? Authorizing Provider  ?amLODipine (NORVASC) 5 MG tablet Take 1 tablet (5 mg total) by mouth daily. 02/05/22   de [place], Raymond J, MD  ?cyclobenzaprine (FLEXERIL) 10 MG tablet Take 1 tablet (10 mg total) by mouth 3 (three) times daily as needed for muscle spasms. 02/05/22   de [place], Raymond J, MD  ?diclofenac Sodium (VOLTAREN) 1 % GEL Apply 4 g topically 4 (four) times daily. 02/06/21   Glenford Bayley, MD  ?gabapentin (NEURONTIN) 300 MG capsule Take 1 capsule (300 mg total) by mouth 3 (three) times daily. 02/28/22   Waldon Merl, PA-C  ?predniSONE (DELTASONE) 10 MG tablet Take 4 tablets (40 mg total) by mouth daily with breakfast for 5 days, THEN 3 tablets (30 mg total) daily with breakfast for 2 days, THEN 2 tablets (20 mg total) daily with breakfast for 2 days, THEN 1 tablet (10 mg total) daily with breakfast for 2 days 02/05/22   de [place], Buren Kos, MD  ?rosuvastatin (CRESTOR) 5 MG tablet Take 1 tablet (5 mg total) by mouth daily. 02/05/22   de [place], Raymond J, MD  ?valACYclovir (VALTREX) 1000 MG tablet Take 1 tablet (1,000 mg total) by mouth 3 (three) times daily for 7 days. 02/28/22 03/07/22  Waldon Merl, PA-C  ? ? ?Physical Exam: ?Vitals:  ? 03/02/22 1445 03/02/22 1500 03/02/22 1557 03/02/22 1704  ?BP:  93/77 133/78 126/80  ?Pulse: 66 62 67 68  ?Resp: 14 14 14 14   ?Temp:  98.3 ?F (36.8 ?C) 98.3 ?F (36.8 ?C) 97.6 ?F (36.4 ?C)  ?TempSrc:  Oral Oral Oral  ?SpO2: 97% 100% 99% 98%  ? ? ?Constitutional: NAD, calm, comfortable ?Vitals:  ? 03/02/22 1445 03/02/22 1500 03/02/22 1557 03/02/22 1704  ?BP:  93/77 133/78 126/80  ?Pulse: 66 62 67 68  ?Resp: 14 14 14 14   ?Temp:  98.3 ?F (36.8 ?C) 98.3 ?F (36.8 ?C) 97.6 ?F (36.4 ?C)  ?TempSrc:  Oral Oral Oral  ?SpO2: 97% 100% 99% 98%  ? ?Eyes: PERRL, lids and conjunctivae normal ?ENMT: Mucous membranes are dry. Posterior pharynx  clear of any exudate or lesions.Normal dentition.  ?Neck: normal, supple, no masses, no thyromegaly ?Respiratory: clear to auscultation bilaterally, no wheezing, no crackles. Normal respiratory effort. No accessory muscle use.  ?Cardiovascular: Regular rate and rhythm, no murmurs / rubs / gallops. No extremity edema. 2+ pedal pulses. No carotid bruits.  ?Abdomen: no tenderness, no masses palpated. No hepatosplenomegaly. Bowel sounds positive.  ?Musculoskeletal: no clubbing / cyanosis. No joint deformity upper and lower extremities. Good ROM, no contractures. Normal muscle tone.  ?Skin: Right-sided T9 dermatome bicycle flexion trace with surrounding rash, not crossing front/back midline ?Neurologic: CN 2-12 grossly intact. Sensation intact, DTR normal. Strength 5/5 in all 4.  ?Psychiatric: Normal judgment and insight. Alert and oriented x 3. Normal mood.  ? ? ? ?Labs on Admission: I have personally reviewed  following labs and imaging studies ? ?CBC: ?Recent Labs  ?Lab 03/02/22 ?0924  ?WBC 6.3  ?NEUTROABS 3.2  ?HGB 14.3  ?HCT 42.3  ?MCV 83.9  ?PLT 178  ? ?Basic Metabolic Panel: ?Recent Labs  ?Lab 03/02/22 ?0924  ?NA 135  ?K 3.6  ?CL 103  ?CO2 23  ?GLUCOSE 126*  ?BUN 12  ?CREATININE 1.35*  ?CALCIUM 9.2  ? ?GFR: ?Estimated Creatinine Clearance: 99.2 mL/min (A) (by C-G formula based on SCr of 1.35 mg/dL (H)). ?Liver Function Tests: ?Recent Labs  ?Lab 03/02/22 ?0924  ?AST 20  ?ALT 26  ?ALKPHOS 51  ?BILITOT 2.0*  ?PROT 7.7  ?ALBUMIN 4.3  ? ?No results for input(s): LIPASE, AMYLASE in the last 168 hours. ?No results for input(s): AMMONIA in the last 168 hours. ?Coagulation Profile: ?Recent Labs  ?Lab 03/02/22 ?0924  ?INR 1.0  ? ?Cardiac Enzymes: ?No results for input(s): CKTOTAL, CKMB, CKMBINDEX, TROPONINI in the last 168 hours. ?BNP (last 3 results) ?No results for input(s): PROBNP in the last 8760 hours. ?HbA1C: ?No results for input(s): HGBA1C in the last 72 hours. ?CBG: ?Recent Labs  ?Lab 03/02/22 ?0912  ?GLUCAP 120*   ? ?Lipid Profile: ?No results for input(s): CHOL, HDL, LDLCALC, TRIG, CHOLHDL, LDLDIRECT in the last 72 hours. ?Thyroid Function Tests: ?No results for input(s): TSH, T4TOTAL, FREET4, T3FREE, THYROIDAB in

## 2022-03-02 NOTE — ED Notes (Signed)
[facility] at bedside 

## 2022-03-02 NOTE — ED Notes (Signed)
Provider stated pt can have something to eat. Pt given ginger ale and crackers.  ?

## 2022-03-03 ENCOUNTER — Inpatient Hospital Stay (HOSPITAL_COMMUNITY): Payer: BC Managed Care – PPO

## 2022-03-03 ENCOUNTER — Other Ambulatory Visit (HOSPITAL_COMMUNITY): Payer: BC Managed Care – PPO

## 2022-03-03 DIAGNOSIS — I10 Essential (primary) hypertension: Secondary | ICD-10-CM | POA: Diagnosis not present

## 2022-03-03 DIAGNOSIS — R569 Unspecified convulsions: Secondary | ICD-10-CM | POA: Diagnosis not present

## 2022-03-03 DIAGNOSIS — G0490 Encephalitis and encephalomyelitis, unspecified: Secondary | ICD-10-CM | POA: Diagnosis not present

## 2022-03-03 LAB — BASIC METABOLIC PANEL
Anion gap: 5 (ref 5–15)
BUN: 8 mg/dL (ref 6–20)
CO2: 26 mmol/L (ref 22–32)
Calcium: 8.4 mg/dL — ABNORMAL LOW (ref 8.9–10.3)
Chloride: 106 mmol/L (ref 98–111)
Creatinine, Ser: 1.31 mg/dL — ABNORMAL HIGH (ref 0.61–1.24)
GFR, Estimated: >60 mL/min (ref >60)
Glucose, Bld: 89 mg/dL (ref 70–99)
Potassium: 3.2 mmol/L — ABNORMAL LOW (ref 3.5–5.1)
Sodium: 137 mmol/L (ref 135–145)

## 2022-03-03 LAB — CSF CELL COUNT WITH DIFFERENTIAL
Eosinophils, CSF: 0 % (ref 0–1)
Lymphs, CSF: 82 % — ABNORMAL HIGH (ref 40–80)
Monocyte-Macrophage-Spinal Fluid: 14 % — ABNORMAL LOW (ref 15–45)
RBC Count, CSF: 17250 /mm3 — ABNORMAL HIGH
Segmented Neutrophils-CSF: 4 % (ref 0–6)
Tube #: 1
WBC, CSF: 34 /mm3 (ref 0–5)

## 2022-03-03 LAB — CBC
HCT: 38.6 % — ABNORMAL LOW (ref 39.0–52.0)
Hemoglobin: 13.2 g/dL (ref 13.0–17.0)
MCH: 29 pg (ref 26.0–34.0)
MCHC: 34.2 g/dL (ref 30.0–36.0)
MCV: 84.8 fL (ref 80.0–100.0)
Platelets: 160 10*3/uL (ref 150–400)
RBC: 4.55 MIL/uL (ref 4.22–5.81)
RDW: 13.7 % (ref 11.5–15.5)
WBC: 4.6 10*3/uL (ref 4.0–10.5)
nRBC: 0 % (ref 0.0–0.2)

## 2022-03-03 LAB — GLUCOSE, CAPILLARY: Glucose-Capillary: 87 mg/dL (ref 70–99)

## 2022-03-03 LAB — HIV ANTIBODY (ROUTINE TESTING W REFLEX): HIV Screen 4th Generation wRfx: NONREACTIVE

## 2022-03-03 MED ORDER — LIDOCAINE HCL (PF) 1 % IJ SOLN
5.0000 mL | Freq: Once | INTRAMUSCULAR | Status: AC
  Administered 2022-03-03: 8 mL via INTRADERMAL
  Filled 2022-03-03: qty 5

## 2022-03-03 MED ORDER — POTASSIUM CHLORIDE CRYS ER 20 MEQ PO TBCR
40.0000 meq | EXTENDED_RELEASE_TABLET | Freq: Once | ORAL | Status: AC
  Administered 2022-03-03: 40 meq via ORAL
  Filled 2022-03-03: qty 2

## 2022-03-03 MED ORDER — LEVETIRACETAM IN NACL 500 MG/100ML IV SOLN
500.0000 mg | Freq: Two times a day (BID) | INTRAVENOUS | Status: DC
  Administered 2022-03-03 – 2022-03-07 (×9): 500 mg via INTRAVENOUS
  Filled 2022-03-03 (×9): qty 100

## 2022-03-03 MED ORDER — ENOXAPARIN SODIUM 40 MG/0.4ML IJ SOSY
40.0000 mg | PREFILLED_SYRINGE | INTRAMUSCULAR | Status: DC
  Administered 2022-03-04 – 2022-03-07 (×4): 40 mg via SUBCUTANEOUS
  Filled 2022-03-03 (×4): qty 0.4

## 2022-03-03 NOTE — Progress Notes (Deleted)
LTM EEG hooked up and running - no initial skin breakdown - push button tested - neuro notified. No [facility] monitoring-patient in the ED currently. ?

## 2022-03-03 NOTE — Progress Notes (Signed)
Neurology Progress Note ? ? ?S:// ?Patient sitting up in bed in NAD. Family is at bedside. LP was attempted under Fluoro and was unsuccessful, however it appears CSF labs were sent.  No neck pain Waiting on EEG and MRI. Family has not noticed any further seizure activity.  ? ? ?O:// ?Current vital signs: ?BP 126/77 (BP Location: Right Arm)   Pulse 81   Temp 98.7 ?F (37.1 ?C) (Oral)   Resp 13   SpO2 98%  ?Vital signs in last 24 hours: ?Temp:  [97.6 ?F (36.4 ?C)-98.8 ?F (37.1 ?C)] 98.7 ?F (37.1 ?C) (05/06 7591) ?Pulse Rate:  [62-90] 81 (05/06 1154) ?Resp:  [13-20] 13 (05/06 1154) ?BP: (93-136)/(62-92) 126/77 (05/06 1154) ?SpO2:  [94 %-100 %] 98 % (05/06 1154) ? ?GENERAL: Awake, alert in NAD ?HEENT: - Normocephalic and atraumatic, dry mm ?LUNGS - Clear to auscultation bilaterally with no wheezes ?CV - S1S2 RRR, no m/r/g, equal pulses bilaterally. ?ABDOMEN - Soft, nontender, nondistended with normoactive BS ?Ext: warm, well perfused, intact peripheral pulses, no edema ?NEURO:  ?Mental Status: AA&Ox4 ?Language: speech is clear.  Naming, repetition, fluency, and comprehension intact. ?Cranial Nerves: PERRL 2 mm/brisk. EOMI, visual fields full, no facial asymmetry, facial sensation intact, hearing intact, tongue/uvula/soft palate midline, normal sternocleidomastoid and trapezius muscle strength. No evidence of tongue atrophy or fibrillations ?Motor: RUE and RLE 5/5, LUE and LLE 4/5  ?Tone: is normal and bulk is normal ?Sensation- decreased on right  ?Coordination: FTN intact bilaterally, no ataxia in BLE. ?Gait- deferred ? ? ?Medications ? ?Current Facility-Administered Medications:  ?  acetaminophen (TYLENOL) tablet 650 mg, 650 mg, Oral, Q6H PRN **OR** acetaminophen (TYLENOL) suppository 650 mg, 650 mg, Rectal, Q6H PRN, Emeline General, MD ?  acyclovir (ZOVIRAX) 1,000 mg in dextrose 5 % 150 mL IVPB, 1,000 mg, Intravenous, Q8H, Marja Kays, RPH, Last Rate: 170 mL/hr at 03/03/22 1313, 1,000 mg at 03/03/22 1313 ?   amLODipine (NORVASC) tablet 5 mg, 5 mg, Oral, Daily, Mikey College T, MD, 5 mg at 03/03/22 0950 ?  butalbital-acetaminophen-caffeine (FIORICET) 50-325-40 MG per tablet 1 tablet, 1 tablet, Oral, Q6H PRN, Emeline General, MD, 1 tablet at 03/02/22 1837 ?  cefTRIAXone (ROCEPHIN) 2 g in sodium chloride 0.9 % 100 mL IVPB, 2 g, Intravenous, Q12H, Marja Kays, RPH, Last Rate: 200 mL/hr at 03/03/22 1001, 2 g at 03/03/22 1001 ?  cyclobenzaprine (FLEXERIL) tablet 10 mg, 10 mg, Oral, TID PRN, Emeline General, MD ?  Melene Muller ON 03/04/2022] enoxaparin (LOVENOX) injection 40 mg, 40 mg, Subcutaneous, Q24H, Ghimire, Werner Lean, MD ?  gabapentin (NEURONTIN) capsule 300 mg, 300 mg, Oral, TID, Mikey College T, MD, 300 mg at 03/03/22 0950 ?  ketorolac (TORADOL) 30 MG/ML injection 30 mg, 30 mg, Intravenous, Q6H PRN, Mikey College T, MD, 30 mg at 03/03/22 6384 ?  lactated ringers infusion, , Intravenous, Continuous, Mikey College T, MD, Last Rate: 125 mL/hr at 03/03/22 0643, New Bag at 03/03/22 0643 ?  levETIRAcetam (KEPPRA) IVPB 500 mg/100 mL premix, 500 mg, Intravenous, BID, Caryl Pina, MD, Last Rate: 400 mL/hr at 03/03/22 0647, 500 mg at 03/03/22 0647 ?  LORazepam (ATIVAN) injection 2 mg, 2 mg, Intravenous, Q4H PRN, Mikey College T, MD ?  rosuvastatin (CRESTOR) tablet 5 mg, 5 mg, Oral, Daily, Mikey College T, MD, 5 mg at 03/03/22 6659 ? ?Imaging ?I have reviewed images in epic and the results pertinent to this consultation are: ? ?CT-scan of the brain 5/5: ?No acute process  ? ?Assessment:  ?  56 y.o.  male with history of HTN, HLD, aseptic meningitis in 2014-who developed shingles in his right flank area of his torso-started on Valtrex 3 days prior to this hospitalization-subsequently developed fever 1 day prior to this hospitalization-he was at his PCPs office when he was noted to have a generalized tonic-clonic seizure. He was sitting in a chair when per wife he suddenly started staring blankly, eyes deviated to the right and limbs then started  jerking, with generalized tonic-clonic seizure activity. The seizure lasted for about 2 minutes, followed by an approximately 10 minute long postictal period. 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 He was loaded with 3000 mg IV Keppra. ? ?New onset seizure  ? ?Recommendations: ?- Awaiting EEG (already ordered)  ?- Awaiting MRI Brain w/wo (already ordered) ?- continue Keppr 500mg  BID  ?- Continue empiric IV Abx ?- Follow CSF labs  ?- Neurology will continue to follow  ? ? DNP, ACNPC-AG  ? ?

## 2022-03-03 NOTE — Progress Notes (Signed)
?      ?                 PROGRESS NOTE ? ?      ?PATIENT DETAILS ?Name: Darrell King ?Age: 56 y.o. ?Sex: male ?Date of Birth: 04/01/1966 ?Admit Date: 03/02/2022 ?Admitting Physician Emeline General, MD ?PCP:de [place], Buren Kos, MD ? ?Brief Summary: ?Patient is a 56 y.o.  male with history of HTN, HLD, aseptic meningitis in 2014-who developed shingles in his right flank area of his torso-started on Valtrex 3 days prior to this hospitalization-subsequently developed fever 1 day prior to this hospitalization-he was at his PCPs office when he was noted to have a generalized tonic-clonic seizure.  He was subsequently sent to the emergency room-and admitted to [facility] service. ? ? ?Significant events: ?5/5>> admit to [facility]-fever/altered mental status/headache-seizure at PCPs office.  Neuro consulted-IV Keppra/IV acyclovir-LP planned under fluoroscopy. ? ?Significant studies: ?5/5>> CT head: No acute abnormalities. ?5/5>> CXR: No PNA ? ?Significant microbiology data: ?5/5>> blood culture: No growth ?5/5>> COVID/influenza: Negative ? ?Procedures: ?None ? ?Consults: ?Neurology ? ?Subjective: ?Complains of headache-photosensitive.  Is awake/alert. ? ?Objective: ?Vitals: ?Blood pressure (!) 117/92, pulse 83, temperature 98.7 ?F (37.1 ?C), temperature source Oral, resp. rate 14, SpO2 97 %.  ? ?Exam: ?Gen Exam:Alert awake-not in any distress ?HEENT:atraumatic, normocephalic ?Chest: B/L clear to auscultation anteriorly ?CVS:S1S2 regular ?Abdomen:soft non tender, non distended ?Extremities:no edema ?Neurology: Non focal ?Skin: no rash.  Active shingles in the right torso/flank area-not crusted yet. ? ?Pertinent Labs/Radiology: ? ?  Latest Ref Rng & Units 03/03/2022  ?  1:10 AM 03/02/2022  ?  9:24 AM 02/05/2022  ? 11:01 AM  ?CBC  ?WBC 4.0 - 10.5 K/uL 4.6   4.9    ? 6.3   5.9    ?Hemoglobin 13.0 - 17.0 g/dL 76.1   95.0    ? 93.2   16.7    ?Hematocrit 39.0 - 52.0 % 38.6   42.0    ? 42.3   49.2    ?Platelets 150 - 400 K/uL 160   177    ? 178   217     ?  ?Lab Results  ?Component Value Date  ? NA 137 03/03/2022  ? K 3.2 (L) 03/03/2022  ? CL 106 03/03/2022  ? CO2 26 03/03/2022  ?  ? ? ?Assessment/Plan: ?Fever/headache/seizures/confusion: Suspicion for VZV encephalitis-given ongoing active shingles in his right T7 dermatome.  Empirically covered with acyclovir-and IV Rocephin for now.  On Keppra as well.  His mentation is now back to normal-and he looks nontoxic.  Given his large body habitus-LP was not performed yesterday-I spoke with IR PA-C-plan is to attempt under fluoroscopy today.  Awaiting MRI brain/EEG/CSF studies and further recommendations from neurology. ? ?Right T7 dermatome shingles: On IV acyclovir-on gabapentin for neuropathic pain. ? ?AKI: Mild hemodynamically mediated kidney injury-should improve with supportive care.  Follow electrolytes closely. ? ?Hypokalemia: Replete and recheck. ? ?HTN: Stable-continue amlodipine. ? ?HLD: Continue statin ? ?Obesity: ?Estimated body mass index is 34.71 kg/m? as calculated from the following: ?  Height as of an earlier encounter on 03/02/22: 6\' 7"  (2.007 m). ?  Weight as of an earlier encounter on 03/02/22: 139.8 kg.  ? ?Code status: ?  Code Status: Full Code  ? ?DVT Prophylaxis: ?enoxaparin (LOVENOX) injection 40 mg Start: 03/04/22 1000 ?  ?Family Communication: Spouse at bedside ? ? 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 ?  ?Planned Discharge Destination:Home ? ? ?Diet: ?Diet Order   ? ?       ?  Diet Heart Room service appropriate? Yes; Fluid consistency: Thin  Diet effective now       ?  ? ?  ?  ? ?  ?  ? ? ?Antimicrobial agents: ?Anti-infectives (From admission, onward)  ? ? Start     Dose/Rate Route Frequency Ordered Stop  ? 03/02/22 2200  cefTRIAXone (ROCEPHIN) 2 g in sodium chloride 0.9 % 100 mL IVPB       ? 2 g ?200 mL/hr over 30 Minutes Intravenous Every 12 hours 03/02/22 1018     ? 03/02/22 1800  acyclovir (ZOVIRAX) 1,000 mg in dextrose 5 % 150 mL IVPB       ? 1,000 mg ?170 mL/hr over 60 Minutes Intravenous Every 8 hours 03/02/22 1030    ? 03/02/22 1000  acyclovir (ZOVIRAX) 1,000 mg in dextrose 5 % 150 mL IVPB       ? 1,000 mg ?170 mL/hr over 60 Minutes Intravenous  Once 03/02/22 0948 03/02/22 1259  ? 03/02/22 0951  acyclovir (ZOVIRAX) 50 MG/ML injection       ?Note to Pharmacy: Ivery Quale S: cabinet override  ?    03/02/22 0951 03/02/22 2159  ? 03/02/22 0945  cefTRIAXone (ROCEPHIN) 2 g in sodium chloride 0.9 % 100 mL IVPB  Status:  Discontinued       ? 2 g ?200 mL/hr over 30 Minutes Intravenous Every 24 hours 03/02/22 0932 03/02/22 1018  ? ?  ? ? ? ?MEDICATIONS: ?Scheduled Meds: ? amLODipine  5 mg Oral Daily  ? [START ON 03/04/2022] enoxaparin (LOVENOX) injection  40 mg Subcutaneous Q24H  ? gabapentin  300 mg Oral TID  ? rosuvastatin  5 mg Oral Daily  ? ?Continuous Infusions: ? acyclovir 1,000 mg (03/03/22 0255)  ? cefTRIAXone (ROCEPHIN)  IV 2 g (03/02/22 2244)  ? lactated ringers 125 mL/hr at 03/03/22 8127  ? levETIRAcetam 500 mg (03/03/22 5170)  ? ?PRN Meds:.acetaminophen **OR** acetaminophen, butalbital-acetaminophen-caffeine, cyclobenzaprine, ketorolac, LORazepam ? ? ?I have personally reviewed following labs and imaging studies ? ?LABORATORY DATA: ?CBC: ?Recent Labs  ?Lab 03/02/22 ?0924 03/03/22 ?0110  ?WBC 4.9  6.3 4.6  ?NEUTROABS 2.7  3.2  --   ?HGB 14.3  14.6 13.2  ?HCT 42.3  42.0 38.6*  ?MCV 83.9  84 84.8  ?PLT 178  177 160  ? ? ?Basic Metabolic Panel: ?Recent Labs  ?Lab 03/02/22 ?0924 03/03/22 ?0110  ?NA 137  135 137  ?K 4.1  3.6 3.2*  ?CL 101  103 106  ?CO2 22  23 26   ?GLUCOSE 126*  99 89  ?BUN 10  12 8   ?CREATININE 1.40*  1.35* 1.31*  ?CALCIUM 9.0  9.2 8.4*  ? ? ?GFR: ?Estimated Creatinine Clearance: 102.2 mL/min (A) (by C-G formula based on SCr of 1.31 mg/dL (H)). ? ?Liver Function Tests: ?Recent Labs  ?Lab 03/02/22 ?0924  ?AST 22  20  ?ALT 26  26  ?ALKPHOS 61   51  ?BILITOT 2.0*  1.7*  ?PROT 7.0  7.7  ?ALBUMIN 4.0  4.3  ? ?No results for input(s): LIPASE, AMYLASE in the last 168 hours. ?No results for input(s): AMMONIA in the last 168 hours. ? ?Coagulation Profile: ?Recent Labs  ?Lab 03/02/22 ?0924  ?INR 1.0  ? ? ?Cardiac Enzymes: ?No results for input(s): CKTOTAL, CKMB, CKMBINDEX, TROPONINI in the last 168 hours. ? ?BNP (  last 3 results) ?No results for input(s): PROBNP in the last 8760 hours. ? ?Lipid Profile: ?No results for input(s): CHOL, HDL, LDLCALC, TRIG, CHOLHDL, LDLDIRECT in the last 72 hours. ? ?Thyroid Function Tests: ?No results for input(s): TSH, T4TOTAL, FREET4, T3FREE, THYROIDAB in the last 72 hours. ? ?Anemia Panel: ?No results for input(s): VITAMINB12, FOLATE, FERRITIN, TIBC, IRON, RETICCTPCT in the last 72 hours. ? ?Urine analysis: ?No results found for: COLORURINE, APPEARANCEUR, LABSPEC, PHURINE, GLUCOSEU, HGBUR, BILIRUBINUR, KETONESUR, PROTEINUR, UROBILINOGEN, NITRITE, LEUKOCYTESUR ? ?Sepsis Labs: ?Lactic Acid, Venous ?   ?Component Value Date/Time  ? LATICACIDVEN 1.2 03/02/2022 1133  ? ? ?MICROBIOLOGY: ?Recent Results (from the past 240 hour(s))  ?Culture, blood (Routine x 2)     Status: None (Preliminary result)  ? Collection Time: 03/02/22  9:20 AM  ? Specimen: BLOOD LEFT ARM  ?Result Value Ref Range Status  ? Specimen Description   Final  ?  BLOOD LEFT ARM ?Performed at [facility], [address], [place], [place] [address] ?  ? Special Requests   Final  ?  BOTTLES DRAWN AEROBIC AND ANAEROBIC Blood Culture adequate volume ?Performed at [facility], [address], [place], [place] [address] ?  ? Culture   Final  ?  NO GROWTH < 24 HOURS ?Performed at [facility], [address] [place], [place] [address] ?  ? Report Status PENDING  Incomplete  ?Culture, blood (Routine x 2)     Status: None (Preliminary result)  ? Collection Time: 03/02/22  9:35 AM  ? Specimen: BLOOD  ?Result Value Ref Range  Status  ? Specimen Description   Final  ?  BLOOD RIGHT ANTECUBITAL ?Performed at [facility], [address], [place], [place] [address] ?  ? Special Requests   Final  ?  BOTTLES DRAWN AEROBIC AND Tanna SavoyANAERO

## 2022-03-03 NOTE — Plan of Care (Signed)

## 2022-03-03 NOTE — Progress Notes (Signed)
Darrell King is Darrell 56 y.o. male patient. Patient Aox4. No complaints of pain have been made this shift. Family is at bedside. LP attempted this shift with puncture site from L3-L4 and L4-L5, clean dry and intact foam dressing. No drainage noted at this time. CSF WBC elevated at 34, MD made aware. No neck pain reported by patient. Pending EEG and MRI. Prn Tylenol for LP site pain administered. PRN FIORICET administered for clients headache. Scheduled gabapentin administered. IV antibiotics continued. LR at 125 mL/hr continues administered. Seizure precautions maintained.  ? ?1. Viral meningitis, unspecified   ? ?Past Medical History:  ?Diagnosis Date  ? Acute gout 12/31/2016  ? Anxiety   ? "had taken Paxil; back in 1996; nothing since" (10/21/2013)  ? Arthritis   ? Complication of anesthesia   ? SLOW TO WAKE UP AFTER SURGERY IN 2019  ? Dyslipidemia 03/11/2016  ? 12.5% 10-year risk of heart disease or stroke, should be on Darrell moderate/high intensity statin.    ? Encounter for hepatitis C screening test for low risk patient 02/02/2021  ? Essential hypertension, benign 02/23/2016  ? 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 Refusal of blood transfusions as patient is Jehovah's Witness   ? ?Current Facility-Administered Medications  ?Medication Dose Route Frequency Provider Last Rate Last Admin  ? acetaminophen (TYLENOL) tablet 650 mg  650 mg Oral Q6H PRN Emeline General, MD   650 mg at 03/03/22 1454  ? Or  ? acetaminophen (TYLENOL) suppository 650 mg  650 mg Rectal Q6H PRN Mikey College T, MD      ? acyclovir (ZOVIRAX) 1,000 mg in dextrose 5 % 150 mL IVPB  1,000 mg Intravenous Q8H Marja Kays, RPH 170 mL/hr at 03/03/22 1313 1,000 mg at 03/03/22 1313  ? amLODipine (NORVASC) tablet 5 mg  5 mg Oral Daily Mikey College T, MD   5 mg at 03/03/22 0950  ? butalbital-acetaminophen-caffeine (FIORICET) 50-325-40 MG per tablet 1 tablet  1 tablet Oral Q6H PRN Emeline General, MD   1 tablet at 03/03/22 1453  ? cefTRIAXone (ROCEPHIN) 2 g in  sodium chloride 0.9 % 100 mL IVPB  2 g Intravenous Q12H Marja Kays, RPH 200 mL/hr at 03/03/22 1001 2 g at 03/03/22 1001  ? cyclobenzaprine (FLEXERIL) tablet 10 mg  10 mg Oral TID PRN Emeline General, MD      ? Melene Muller ON 03/04/2022] enoxaparin (LOVENOX) injection 40 mg  40 mg Subcutaneous Q24H Ghimire, Werner Lean, MD      ? gabapentin (NEURONTIN) capsule 300 mg  300 mg Oral TID Mikey College T, MD   300 mg at 03/03/22 1453  ? ketorolac (TORADOL) 30 MG/ML injection 30 mg  30 mg Intravenous Q6H PRN Mikey College T, MD   30 mg at 03/03/22 6962  ? lactated ringers infusion   Intravenous Continuous Emeline General, MD 125 mL/hr at 03/03/22 0643 New Bag at 03/03/22 [phone]  ? levETIRAcetam (KEPPRA) IVPB 500 mg/100 mL premix  500 mg Intravenous BID Caryl Pina, MD 400 mL/hr at 03/03/22 0647 500 mg at 03/03/22 0647  ? LORazepam (ATIVAN) injection 2 mg  2 mg Intravenous Q4H PRN Mikey College T, MD      ? rosuvastatin (CRESTOR) tablet 5 mg  5 mg Oral Daily Mikey College T, MD   5 mg at 03/03/22 4132  ? ?No Known Allergies ?Principal Problem: ?  New onset seizure (HCC) ?Active Problems: ?  Essential hypertension, benign ?  Herpes zoster ?  Viral encephalitis ?  Encephalitis ? ?Blood pressure 126/77, pulse 81, temperature 98.7 ?F (37.1 ?C), temperature source Oral, resp. rate 13, SpO2 98 %. ? ?Subjective ?Objective: ?Vital signs: (most recent): Blood pressure 126/77, pulse 81, temperature 98.7 ?F (37.1 ?C), temperature source Oral, resp. rate 13, SpO2 98 %.   ?Assessment & Plan ? ?Darrell King Darrell King ?03/03/2022 ? ? ?

## 2022-03-03 NOTE — Procedures (Signed)
PROCEDURE SUMMARY: ? ?Lumbar puncture performed from L3-L4 and L4-L5.  ?Yielded 1.5cc of pink CSF.  Acquisition of additional fluid unsuccessful ?No immediate post procedure complications.  ?Pt agitated and restless during procedure.  ? ?Specimen was sent for labs. ? ?EBL < 7mL ? ?Recommend optimizing hydration and possible benzodiazepine administration if further attempt required. ? Sheliah Plane PA-C ?03/03/2022 ?11:46 AM ? ? ? ?

## 2022-03-03 NOTE — Progress Notes (Signed)
One tech on the floor today. Work flow is busy. Patient may have to wait until tomorrow ?

## 2022-03-03 NOTE — Plan of Care (Signed)
?  Problem: Education: ?Goal: Knowledge of General Education information will improve ?Description: Including pain rating scale, medication(s)/side effects and non-pharmacologic comfort measures ?03/03/2022 1511 by Waynette Buttery, RN ?Outcome: Adequate for Discharge ?03/03/2022 1145 by Waynette Buttery, RN ?Outcome: Progressing ?  ?Problem: Clinical Measurements: ?Goal: Will remain free from infection ?03/03/2022 1511 by Waynette Buttery, RN ?Outcome: Adequate for Discharge ?03/03/2022 1145 by Waynette Buttery, RN ?Outcome: Progressing ?  ?Problem: Pain Managment: ?Goal: General experience of comfort will improve ?03/03/2022 1511 by Waynette Buttery, RN ?Outcome: Adequate for Discharge ?03/03/2022 1145 by Waynette Buttery, RN ?Outcome: Progressing ?  ?Problem: Safety: ?Goal: Ability to remain free from injury will improve ?03/03/2022 1511 by Waynette Buttery, RN ?Outcome: Adequate for Discharge ?03/03/2022 1145 by Waynette Buttery, RN ?Outcome: Progressing ?  ?Problem: Skin Integrity: ?Goal: Risk for impaired skin integrity will decrease ?03/03/2022 1511 by Waynette Buttery, RN ?Outcome: Adequate for Discharge ?03/03/2022 1145 by Waynette Buttery, RN ?Outcome: Progressing ?  ?

## 2022-03-04 ENCOUNTER — Inpatient Hospital Stay (HOSPITAL_COMMUNITY): Payer: BC Managed Care – PPO

## 2022-03-04 DIAGNOSIS — R569 Unspecified convulsions: Secondary | ICD-10-CM | POA: Diagnosis not present

## 2022-03-04 DIAGNOSIS — G0490 Encephalitis and encephalomyelitis, unspecified: Secondary | ICD-10-CM | POA: Diagnosis not present

## 2022-03-04 LAB — BASIC METABOLIC PANEL
Anion gap: 7 (ref 5–15)
BUN: 9 mg/dL (ref 6–20)
CO2: 25 mmol/L (ref 22–32)
Calcium: 8.5 mg/dL — ABNORMAL LOW (ref 8.9–10.3)
Chloride: 108 mmol/L (ref 98–111)
Creatinine, Ser: 1.3 mg/dL — ABNORMAL HIGH (ref 0.61–1.24)
GFR, Estimated: >60 mL/min (ref >60)
Glucose, Bld: 100 mg/dL — ABNORMAL HIGH (ref 70–99)
Potassium: 3.7 mmol/L (ref 3.5–5.1)
Sodium: 140 mmol/L (ref 135–145)

## 2022-03-04 LAB — GLUCOSE, CAPILLARY
Glucose-Capillary: 95 mg/dL (ref 70–99)
Glucose-Capillary: 92 mg/dL (ref 70–99)

## 2022-03-04 LAB — CBC
HCT: 37.9 % — ABNORMAL LOW (ref 39.0–52.0)
Hemoglobin: 12.8 g/dL — ABNORMAL LOW (ref 13.0–17.0)
MCH: 28.7 pg (ref 26.0–34.0)
MCHC: 33.8 g/dL (ref 30.0–36.0)
MCV: 85 fL (ref 80.0–100.0)
Platelets: 160 10*3/uL (ref 150–400)
RBC: 4.46 MIL/uL (ref 4.22–5.81)
RDW: 13.7 % (ref 11.5–15.5)
WBC: 4.2 10*3/uL (ref 4.0–10.5)
nRBC: 0 % (ref 0.0–0.2)

## 2022-03-04 LAB — MAGNESIUM: Magnesium: 2.1 mg/dL (ref 1.7–2.4)

## 2022-03-04 LAB — C-REACTIVE PROTEIN: CRP: 2.4 mg/dL — ABNORMAL HIGH (ref <1.0)

## 2022-03-04 MED ORDER — KETOROLAC TROMETHAMINE 15 MG/ML IJ SOLN
30.0000 mg | Freq: Four times a day (QID) | INTRAMUSCULAR | Status: DC | PRN
  Administered 2022-03-04 – 2022-03-06 (×6): 30 mg via INTRAVENOUS
  Filled 2022-03-04 (×6): qty 2

## 2022-03-04 MED ORDER — LACTATED RINGERS IV SOLN: INTRAVENOUS | Status: DC

## 2022-03-04 MED ORDER — GADOBUTROL 1 MMOL/ML IV SOLN
10.0000 mL | Freq: Once | INTRAVENOUS | Status: AC | PRN
  Administered 2022-03-04: 10 mL via INTRAVENOUS

## 2022-03-04 MED ORDER — LACTATED RINGERS IV SOLN: INTRAVENOUS | Status: AC

## 2022-03-04 NOTE — Procedures (Signed)
Patient Name: Darrell King  ?MRN: [id number]  ?Epilepsy Attending: Charlsie Quest  ?Referring Physician/Provider: Caryl Pina, MD ?Date: 03/04/2022 ?Duration: 21.38 mins ? ?Patient history: 56 y.o.  male with history of HTN, HLD, aseptic meningitis in 2014-who developed shingles in his right flank area of his torso-started on Valtrex 3 days prior to this hospitalization-subsequently developed fever 1 day prior to this hospitalization-he was at his PCPs office when he was noted to have a generalized tonic-clonic seizure. EEG to evaluate for seizure ? ?Level of alertness: Awake, asleep ? ?AEDs during EEG study: LEV, GBP ? ?Technical aspects: This EEG study was done with scalp electrodes positioned according to the 10-20 International system of electrode placement. Electrical activity was acquired at a sampling rate of 500Hz  and reviewed with a high frequency filter of 70Hz  and a low frequency filter of 1Hz . EEG data were recorded continuously and digitally stored.  ? ?Description: No clear posterior dominant rhythm was seen. Sleep was characterized by vertex waves, sleep spindles (12 to 14 Hz), maximal frontocentral region.  There is an excessive amount of 15 to 18 Hz beta activity distributed symmetrically and diffusely. Hyperventilation and photic stimulation were not performed.    ? ?ABNORMALITY ?- Excessive beta, generalized ? ?IMPRESSION: ?This study is within normal limits. The excessive beta activity seen in the background is most likely due to the effect of benzodiazepine and is a benign EEG pattern. No seizures or epileptiform discharges were seen throughout the recording. ? ?  ? ?

## 2022-03-04 NOTE — Progress Notes (Signed)
Neurology Progress Note ? ? ?S:// ?Patient laying in bed in NAD. Family is at bedside.  He c/o headache and low back pain. He has been receiving pain medicine which he says has helped. CSF samples sent - CSF cell count 17,250 RBC, 34 WBC's, Lymphs 82, HSV and VZV PCR pending. Waiting on EEG and MRI. Family has not noticed any further seizure activity.  ? ? ?O:// ?Current vital signs: ?BP 109/76 (BP Location: Right Arm)   Pulse 76   Temp 98 ?F (36.7 ?C) (Oral)   Resp 16   SpO2 95%  ?Vital signs in last 24 hours: ?Temp:  [98 ?F (36.7 ?C)-99.8 ?F (37.7 ?C)] 98 ?F (36.7 ?C) (05/07 0800) ?Pulse Rate:  [76-85] 76 (05/07 0800) ?Resp:  [14-20] 16 (05/07 0800) ?BP: (109-114)/(62-76) 109/76 (05/07 0800) ?SpO2:  [95 %-100 %] 95 % (05/07 0800) ? ?GENERAL: Awake, alert in NAD ?HEENT: - Normocephalic and atraumatic, dry mm ?LUNGS - Clear to auscultation bilaterally with no wheezes ?CV - S1S2 RRR, no m/r/g, equal pulses bilaterally. ?ABDOMEN - Soft, nontender, nondistended with normoactive BS ?Ext: warm, well perfused, intact peripheral pulses, no edema ?NEURO:  ?Mental Status: AA&Ox4 ?Language: speech is clear.  Naming, repetition, fluency, and comprehension intact. ?Cranial Nerves: PERRL 2 mm/brisk. EOMI, visual fields full, no facial asymmetry, facial sensation intact, hearing intact, tongue/uvula/soft palate midline, normal sternocleidomastoid and trapezius muscle strength. No evidence of tongue atrophy or fibrillations ?Motor: RUE and RLE 5/5, LUE and LLE 4/5  ?Tone: is normal and bulk is normal ?Sensation- decreased on right  ?Coordination: FTN intact bilaterally, no ataxia in BLE. ?Gait- deferred ? ? ?Medications ? ?Current Facility-Administered Medications:  ?  acetaminophen (TYLENOL) tablet 650 mg, 650 mg, Oral, Q6H PRN, 650 mg at 03/04/22 0858 **OR** acetaminophen (TYLENOL) suppository 650 mg, 650 mg, Rectal, Q6H PRN, Emeline General, MD ?  acyclovir (ZOVIRAX) 1,000 mg in dextrose 5 % 150 mL IVPB, 1,000 mg,  Intravenous, Q8H, Marja Kays, RPH, Last Rate: 170 mL/hr at 03/04/22 0901, 1,000 mg at 03/04/22 0901 ?  amLODipine (NORVASC) tablet 5 mg, 5 mg, Oral, Daily, Mikey College T, MD, 5 mg at 03/04/22 0857 ?  butalbital-acetaminophen-caffeine (FIORICET) 50-325-40 MG per tablet 1 tablet, 1 tablet, Oral, Q6H PRN, Emeline General, MD, 1 tablet at 03/04/22 [phone] ?  cyclobenzaprine (FLEXERIL) tablet 10 mg, 10 mg, Oral, TID PRN, Mikey College T, MD, 10 mg at 03/04/22 0911 ?  enoxaparin (LOVENOX) injection 40 mg, 40 mg, Subcutaneous, Q24H, Ghimire, Werner Lean, MD, 40 mg at 03/04/22 0857 ?  gabapentin (NEURONTIN) capsule 300 mg, 300 mg, Oral, TID, Mikey College T, MD, 300 mg at 03/04/22 9528 ?  ketorolac (TORADOL) 15 MG/ML injection 30 mg, 30 mg, Intravenous, Q6H PRN, Ghimire, Werner Lean, MD ?  lactated ringers infusion, , Intravenous, Continuous, Leroy Sea, MD, Last Rate: 75 mL/hr at 03/04/22 1307, Rate Change at 03/04/22 1307 ?  levETIRAcetam (KEPPRA) IVPB 500 mg/100 mL premix, 500 mg, Intravenous, BID, Caryl Pina, MD, Last Rate: 400 mL/hr at 03/04/22 0906, 500 mg at 03/04/22 0906 ?  LORazepam (ATIVAN) injection 2 mg, 2 mg, Intravenous, Q4H PRN, Mikey College T, MD ?  rosuvastatin (CRESTOR) tablet 5 mg, 5 mg, Oral, Daily, Mikey College T, MD, 5 mg at 03/04/22 0857 ? ?Imaging ?I have reviewed images in epic and the results pertinent to this consultation are: ? ?CT-scan of the brain 5/5: ?No acute process  ? ?Assessment:  ?56 y.o.  male with history of HTN, HLD,  aseptic meningitis in 2014-who developed shingles in his right flank area of his torso-started on Valtrex 3 days prior to this hospitalization-subsequently developed fever 1 day prior to this hospitalization-he was at his PCPs office when he was noted to have a generalized tonic-clonic seizure. He was sitting in a chair when per wife he suddenly started staring blankly, eyes deviated to the right and limbs then started jerking, with generalized tonic-clonic seizure  activity. The seizure lasted for about 2 minutes, followed by an approximately 10 minute long postictal period. 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 He was loaded with 3000 mg IV Keppra. ? 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 ?Recommendations: ?- Awaiting EEG (already ordered)  ?- Awaiting MRI Brain w/wo (already ordered) ?- continue Keppra 500mg  BID  ?- Continue empiric IV Abx ?- Follow CSF labs  ?- Neurology will continue to follow  ? ? DNP, ACNPC-AG  ? ?

## 2022-03-04 NOTE — Plan of Care (Signed)

## 2022-03-04 NOTE — Progress Notes (Signed)
EEG complete - results pending 

## 2022-03-04 NOTE — Progress Notes (Signed)
?      ?                 PROGRESS NOTE ? ?      ?PATIENT DETAILS ?Name: Darrell King ?Age: 56 y.o. ?Sex: male ?Date of Birth: 24-Mar-1966 ?Admit Date: 03/02/2022 ?Admitting Physician Emeline General, MD ?PCP:[place], Buren Kos, MD ? ?Brief Summary: ?Patient is a 56 y.o.  male with history of HTN, HLD, aseptic meningitis in 2014-who developed shingles in his right flank area of his torso-started on Valtrex 3 days prior to this hospitalization-subsequently developed fever 1 day prior to this hospitalization-he was at his PCPs office when he was noted to have a generalized tonic-clonic seizure.  He was subsequently sent to the emergency room-and admitted to [facility] service. ? ? ?Significant events: ?5/5>> admit to [facility]-fever/altered mental status/headache-seizure at PCPs office.  Neuro consulted-IV Keppra/IV acyclovir-LP planned under fluoroscopy. ? ?Significant studies: ?5/5>> CT head: No acute abnormalities. ?5/5>> CXR: No PNA ? ?Significant microbiology data: ?5/5>> blood culture: No growth ?5/5>> COVID/influenza: Negative ? ?Procedures: ?None ? ?Consults: ?Neurology, IR ? ?Subjective: ? ?Patient in bed resting, easily arousable, mild headache, some right-sided flank discomfort, no focal weakness.  Reduced light sensitivity. ? ?Objective: ?Vitals: ?Blood pressure 109/76, pulse 76, temperature 98 ?F (36.7 ?C), temperature source Oral, resp. rate 16, SpO2 95 %.  ? ?Exam: ? ?Awake Alert, No new F.N deficits, Normal affect ?[place].AT,PERRAL ?Supple Neck, No JVD,   ?Symmetrical Chest wall movement, Good air movement bilaterally, CTAB ?RRR,No Gallops, Rubs or new Murmurs,  ?+ve B.Sounds, Abd Soft, No tenderness,   ?No Cyanosis, Clubbing or edema  ? ? ?Assessment/Plan: ? ?Fever/headache/seizures/confusion: Suspicion for VZV encephalitis-given ongoing active shingles in his right T7 dermatome.  Empirically covered with acyclovir-EEG and MRI brain still pending, CSF serology pending, case discussed with neurologist Dr. Amada Jupiter on  03/04/2022.  Monitor on acyclovir. ? ?Right T7 dermatome shingles: On IV acyclovir-on gabapentin for neuropathic pain. ? ?AKI: Mild hemodynamically mediated kidney injury also on acyclovir, hydrate and monitor ? ?Hypokalemia: Replete and recheck. ? ?HTN: Stable-continue amlodipine. ? ?HLD: Continue statin ? ?Obesity: ?Estimated body mass index is 34.71 kg/m? as calculated from the following: ?  Height as of an earlier encounter on 03/02/22: 6\' 7"  (2.007 m). ?  Weight as of an earlier encounter on 03/02/22: 139.8 kg.  ? ?Code status: ?  Code Status: Full Code  ? ?DVT Prophylaxis: ?enoxaparin (LOVENOX) injection 40 mg Start: 03/04/22 1000 ?  ?Family Communication: Spouse at bedside ? ? 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 ?  ?Planned Discharge Destination:Home ? ? ?Diet: ?Diet Order   ? ?       ?  Diet Heart Room service appropriate? Yes; Fluid consistency: Thin  Diet effective now       ?  ? ?  ?  ? ?  ?  ? ?MEDICATIONS: ?Scheduled Meds: ? amLODipine  5 mg Oral Daily  ? enoxaparin (LOVENOX) injection  40 mg Subcutaneous Q24H  ? gabapentin  300 mg Oral TID  ? rosuvastatin  5 mg Oral Daily  ? ?Continuous Infusions: ? acyclovir 1,000 mg (03/04/22 0901)  ? lactated ringers    ? levETIRAcetam 500 mg (03/04/22 0906)  ? ?PRN Meds:.acetaminophen **OR** acetaminophen, butalbital-acetaminophen-caffeine, cyclobenzaprine, ketorolac, LORazepam ? ? ?I have personally reviewed following labs and imaging studies ? ?LABORATORY DATA: ? ?Recent Labs  ?Lab 03/02/22 ?0924 03/03/22 ?0110 03/04/22 ?0054  ?  WBC 4.9  6.3 4.6 4.2  ?HGB 14.3  14.6 13.2 12.8*  ?HCT 42.3  42.0 38.6* 37.9*  ?PLT 178  177 160 160  ?MCV 83.9  84 84.8 85.0  ?MCH 29.1  28.4 29.0 28.7  ?MCHC 34.8  33.8 34.2 33.8  ?RDW 13.9  13.5 13.7 13.7  ?LYMPHSABS 1.5  2.2  --   --   ?MONOABS 0.9  --   --   ?EOSABS 0.1  0.1  --   --   ?BASOSABS 0.0   0.0  --   --   ? ? ?Recent Labs  ?Lab 03/02/22 ?[id number] 03/02/22 ?[id number] 03/02/22 ?1133 03/03/22 ?0110 03/04/22 ?0054  ?NA  --  137  135  --  137 140  ?K  --  4.1  3.6  --  3.2* 3.7  ?CL  --  101  103  --  106 108  ?CO2  --  22  23  --  26 25  ?GLUCOSE  --  126*  99  --  89 100*  ?BUN  --  10  12  --  8 9  ?CREATININE  --  1.40*  1.35*  --  1.31* 1.30*  ?CALCIUM  --  9.0  9.2  --  8.4* 8.5*  ?AST  --  22  20  --   --   --   ?ALT  --  26  26  --   --   --   ?ALKPHOS  --  61  51  --   --   --   ?BILITOT  --  2.0*  1.7*  --   --   --   ?ALBUMIN  --  4.0  4.3  --   --   --   ?MG  --   --   --   --  2.1  ?CRP  --   --   --  2.4*  --   ?LATICACIDVEN 1.5  --  1.2  --   --   ?INR  --  1.0  --   --   --   ? ? ?  ?  ? ? ?RADIOLOGY STUDIES/RESULTS: ?DG FL GUIDED LUMBAR PUNCTURE ? ?Result Date: 03/03/2022 ?CLINICAL DATA:  56 yo male patient who developed shingles in his right flank area of his torso-started on Valtrex 3 days prior to this hospitalization-subsequently developed fever 1 day prior to this hospitalization-he was at his PCPs office when he was noted to have a generalized tonic-clonic seizure, headache. EXAM: DIAGNOSTIC LUMBAR PUNCTURE UNDER FLUOROSCOPIC GUIDANCE COMPARISON:  LP 10/21/13. FLUOROSCOPY: Radiation Exposure Index (as provided by the fluoroscopic device): 46.9 mGy Kerma PROCEDURE: Informed consent was obtained from the patient prior to the procedure, including potential complications of headache, allergy, and pain. With the patient prone, the lower back was prepped with Betadine. 1% Lidocaine was used for local anesthesia. Lumbar puncture was performed at the L3-L4 and L4-L5 levels using a 20 gauge needle with return of 1.5cc clear pink tinged CSF. Opening pressure deferred. Unsuccessful at additional fluid acquisition due to patient movement and subsequent access of venous plexus. There were no apparent complications. IMPRESSION: Lumbar puncture as described above with limited fluid acquisition due  to patient movement and accession of venous plexus. If additional fluid needed for studies, can repeat after MRI head planned for tomorrow. Recommend any further attempt be done after optimizing hydration status and with consideration of benzodiazapine administration. This was communicated to Dr. Jerral RalphGhimire at 1145 on 03/03/22.  This exam was performed by PA [facility] and was supervised and interpreted by Dr. Emmaline Kluver. Electronically Signed   By: Emmaline Kluver M.D.   On: 03/03/2022 16:42   ? ? LOS: 2 days  ? ?Signature ? ?Susa Raring M.D on 03/04/2022 at 10:18 AM   -  To page go to [email or web address]  ? ?  ? ? ? ? ?

## 2022-03-04 NOTE — Progress Notes (Signed)
Pt to MRI via stretcher. Tele called and made aware. ?

## 2022-03-04 NOTE — Evaluation (Signed)
Occupational Therapy Evaluation ?Patient Details ?Name: Darrell King ?MRN: [id number] ?DOB: 1966-04-21 ?Today's Date: 03/04/2022 ? ? 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 ?Clinical Impression ?  ?Pt reports independence at baseline with ADLs and functional mobility, lives with spouse who can provide assist at d/c. Pt currently min-mod A for ADLs, mod I for bed mobility, and min guard for transfers without AD. Pt with headache/pain during session, + orthostatic (see PT note/vitals flowsheet).  Pt presenting with impairments listed below, will follow acutely. Recommend HHOT at d/c. ?   ? ?Recommendations for follow up therapy are one component of a multi-disciplinary discharge planning process, led by the attending physician.  Recommendations may be updated based on patient status, additional functional criteria and insurance authorization.  ? ?Follow Up Recommendations ? Home health OT  ?  ?Assistance Recommended at Discharge Intermittent Supervision/Assistance  ?Patient can return home with the following A little help with walking and/or transfers;A little help with bathing/dressing/bathroom;Assistance with cooking/housework;Assist for transportation ? ?  ?Functional Status Assessment ? Patient has had a recent decline in  their functional status and demonstrates the ability to make significant improvements in function in a reasonable and predictable amount of time.  ?Equipment Recommendations ? Tub/shower seat  ?  ?Recommendations for Other Services   ? ? ?  ?Precautions / Restrictions Precautions ?Precautions: Fall ?Precaution Comments: + orthostatic, watch BP ?Restrictions ?Weight Bearing Restrictions: No  ? ?  ? ?Mobility Bed Mobility ?Overal bed mobility: Modified Independent ?  ?  ?  ?  ?  ?  ?General bed mobility comments: Slow moving, but able to come to sitting EOB without assist ?  ? ?Transfers ?Overall transfer level: Needs assistance ?Equipment used: None ?Transfers: Sit to/from Stand ?Sit to Stand: Min guard ?  ?  ?  ?  ?  ?General transfer comment: reliant on BUE in static standing, decreased tolerance due to headache ?  ? ?  ?Balance Overall balance assessment: Mild deficits observed, not formally tested ?  ?  ?  ?  ?  ?  ?  ?  ?  ?  ?  ?  ?  ?  ?  ?  ?  ?  ?   ? ?ADL either performed or assessed with clinical judgement  ? ?ADL Overall ADL's : Needs assistance/impaired ?Eating/Feeding: Set up;Sitting ?  ?Grooming: Sitting;Minimal assistance;Wash/dry face ?  ?Upper Body Bathing: Minimal assistance;Sitting ?Upper Body Bathing Details (indicate cue type and reason): wife assisting ?Lower Body Bathing: Moderate assistance;Sitting/lateral leans;Sit to/from stand ?  ?Upper Body Dressing : Minimal assistance;Sitting ?Upper Body Dressing Details (indicate cue type and reason): for lines/wires ?Lower Body Dressing: Moderate assistance;Sitting/lateral leans;Sit to/from stand ?  ?Toilet Transfer: Min guard;Ambulation;Regular Toilet ?Toilet Transfer Details (indicate cue type and reason): for safety/orthostatic ?  ?  ?  ?  ?Functional mobility during ADLs: Min guard ?   ? ? ? ?Vision   ?  Vision Assessment?: No apparent visual deficits  ?   ?Perception   ?  ?Praxis   ?  ? ?Pertinent Vitals/Pain Pain Assessment ?Pain Assessment:  Faces ?Pain Score: 6  ?Faces Pain Scale: Hurts even more ?Pain Location: Headache ?Pain Descriptors / Indicators: Headache ?Pain Intervention(s): Limited activity within patient's tolerance, Monitored during session, Repositioned, RN gave pain meds during session  ? ? ? ?Hand Dominance Right ?  ?Extremity/Trunk Assessment Upper Extremity Assessment ?Upper Extremity Assessment: Overall WFL for tasks assessed ?  ?Lower Extremity Assessment ?Lower Extremity Assessment: Defer to PT evaluation ?  ?Cervical / Trunk Assessment ?Cervical / Trunk Assessment: Normal ?  ?Communication Communication ?Communication: No difficulties ?  ?Cognition Arousal/Alertness: Awake/alert ?[facility] for tasks assessed/performed ?Overall Cognitive Status: Within Functional Limits for tasks assessed ?  ?  ?  ?  ?  ?  ?  ?  ?  ?  ?  ?  ?  ?  ?  ?  ?  ?  ?  ?General Comments  +orthostatic, see vitals flowsheet for BP ? ?  ?Exercises   ?  ?Shoulder Instructions    ? ? ?Home Living Family/patient expects to be discharged to:: Private residence ?Living Arrangements: Spouse/significant other ?Available Help at Discharge: Family ?Type of Home: House ?Home Access: Stairs to enter ?Entrance Stairs-Number of Steps: 3 ?Entrance Stairs-Rails: None ?Home Layout: Two level ?Alternate Level Stairs-Number of Steps: 7 ?Alternate Level Stairs-Rails: Left ?Bathroom Shower/Tub: Tub/shower unit ?  ?  ?  ?  ?Home Equipment: None ?  ?Additional Comments: Works in Maintenance ?  ? ?  ?Prior Functioning/Environment Prior Level of Function : Independent/Modified Independent ?  ?  ?  ?  ?  ?  ?Mobility Comments: no AD use ?  ?  ? ?  ?  ?OT Problem List: Decreased strength;Decreased range of motion;Decreased activity tolerance;Impaired balance (sitting and/or standing) ?  ?   ?OT Treatment/Interventions: Self-care/ADL training;Therapeutic exercise;DME and/or AE instruction;Therapeutic activities;Patient/family education;Balance training  ?  ?OT  Goals(Current goals can be found in the care plan section) Acute Rehab OT Goals ?Patient Stated Goal: to go home ?OT Goal Formulation: With patient ?Time For Goal Achievement: 03/18/22 ?Potential to Achieve Goals: Good ?ADL Goals ?Pt Will Perform Grooming: with modified independence;standing ?Pt Will Perform Upper Body Dressing: with supervision;sitting;standing ?Pt Will Perform Lower Body Dressing: with supervision;sitting/lateral leans;sit to/from stand ?Pt Will Transfer to Toilet: with supervision;ambulating;regular height toilet ?Pt Will Perform Tub/Shower Transfer: Tub transfer;Shower transfer;shower seat;ambulating  ?OT Frequency: Min 3X/week ?  ? ?Co-evaluation PT/OT/SLP Co-Evaluation/Treatment: Yes ?Reason for Co-Treatment: For patient/therapist safety;To address functional/ADL transfers ?PT goals addressed during session: Mobility/safety with mobility ?OT goals addressed during session: ADL's and self-care ?  ? ?  ?AM-PAC OT "6 Clicks" Daily Activity     ?Outcome Measure Help from another person eating meals?: None ?Help from another person taking care of personal grooming?: A Little ?Help from another person toileting, which includes using toliet, bedpan, or urinal?: A Little ?Help from another person bathing (including washing, rinsing, drying)?: A Lot ?Help from another person to put on and taking off regular upper body clothing?: A Little ?Help from another person to put on and taking off regular lower body clothing?: A Lot ?6 Click Score: 17 ?  ?End of Session Nurse Communication: Mobility status ? ?Activity Tolerance: Patient tolerated treatment well;Patient limited by pain ?Patient left: in bed;with call bell/phone within reach;with bed alarm set;with family/visitor present ? ?OT Visit Diagnosis: Unsteadiness on feet (R26.81);Other  abnormalities of gait and mobility (R26.89);Muscle weakness (generalized) (M62.81)  ?              ?Time: 1884-1660 ?OT Time Calculation (min): 37 min ?Charges:  OT  General Charges ?$OT Visit: 1 Visit ?OT Evaluation ?$OT Eval Moderate Complexity: 1 Mod ? ?Alfonzo Beers, OTD, OTR/L ?Acute Rehab ?[phone] ? ?Mayer Masker ?03/04/2022, 1:02 PM ?

## 2022-03-04 NOTE — Evaluation (Signed)
Physical Therapy Evaluation ?Patient Details ?Name: Darrell ShinesCharles B King ?MRN: [id number] ?DOB: [birth year] ?Today's Date: 03/04/2022 ? ?History of Present Illness ? 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  ?Pt admitted with above diagnosis. Lives at home with wife, in a multi-level home with 3 steps to enter (no rails, and 7 steps to access bedroom; Prior to admission, pt was able to manage independently, works as a maintenance man; Presents to PT with generalized weakness, significant headache, and lightheadedness with standing activity; Able to get up to EOB, and stand slowly with minguard for safety; took a few steps to the sink, stood briefly at sink, bracing UEs on the counter for support; Became lightheaded standing at sink, and opted to assist pt to make a few steps back to bed with min assist and second person for safety; Will plan to get a full set of orthostatic vitals next session (wife reports they have been concerned for syncope); See vitals flow sheet, or table below; Pt currently with functional limitations due to the deficits listed below (see PT Problem List). Pt will benefit from skilled PT to increase their independence and safety with mobility to allow discharge to the venue listed below.      ?   ? ?Recommendations for  follow up therapy are one component of a multi-disciplinary discharge planning process, led by the attending physician.  Recommendations may be updated based on patient status, additional functional criteria and insurance authorization. ? ?Follow Up Recommendations Home health PT ? ?  ?Assistance Recommended at Discharge Set up Supervision/Assistance  ?Patient can return home with the following ? A little help with walking and/or transfers;Assistance with cooking/housework;Assist for transportation;Help with stairs or ramp for entrance ? ?  ?Equipment Recommendations Rollator (4 wheels);Other (comment) (Will consider Rollator, and continue to monitor for needs)  ?Recommendations for Other Services ?    ?  ?Functional Status Assessment Patient has had a recent decline in their functional status and demonstrates the ability to make significant improvements in function in a reasonable and predictable amount of time.  ? ?  ?Precautions / Restrictions Precautions ?Precautions: Fall ?Precaution Comments: Will check orthostatic vitals ?Restrictions ?Weight Bearing Restrictions: No  ? ?  ? ?Mobility ? Bed Mobility ?Overal bed mobility: Modified Independent ?  ?  ?  ?  ?  ?  ?General bed mobility comments: Slow moving, but able to come to sitting EOB without assist ?  ? ?Transfers ?Overall transfer level: Needs assistance ?Equipment used: None ?Transfers: Sit to/from Stand ?Sit to Stand: Min guard ?  ?  ?  ?  ?  ?General transfer comment: Clsoe guard for safety; Slow rise and dependent on UEs for push/support ("walked" hands up thighs to come to stand); no need for physical assist ?  ? ?Ambulation/Gait ?Ambulation/Gait assistance: Min guard, +2 safety/equipment ?Gait Distance (Feet): 3 Feet (x2; bed>sink>bed) ?Assistive device: None ?Gait Pattern/deviations: Shuffle ?  ?  ?  ?  General Gait Details: Initial steps bed to sink without overt difficulty; noted pt with dizziness and tending to close eyes at sink; so assisted pt  back to bed (2 person for safety); walked backwards to EOB ? ?Stairs ?  ?  ?  ?  ?  ? ?Wheelchair Mobility ?  ? ?Modified Rankin (Stroke Patients Only) ?  ? ?  ? ?Balance   ?  ?  ?  ?  ?  ?  ?  ?  ?  ?  ?  ?  ?  ?  ?  ?  ?  ?  ?   ? ? ? ?Pertinent Vitals/Pain Pain Assessment ?Pain Assessment: Faces ?Faces Pain Scale: Hurts even more ?Pain Location: Headache ?Pain Descriptors / Indicators: Headache ?Pain Intervention(s): RN gave pain meds during session  ? ? ?Home Living Family/patient expects to be discharged to:: Private residence ?Living Arrangements: Spouse/significant other ?Available Help at Discharge: Family ?Type of Home: House ?Home Access: Stairs to enter ?Entrance Stairs-Rails: None ?Entrance Stairs-Number of Steps: 3 ?Alternate Level Stairs-Number of Steps: 7 ?Home Layout: Two level ?Home Equipment: None ?Additional Comments: Works in Maintenance  ?  ?Prior Function Prior Level of Function : Independent/Modified Independent ?  ?  ?  ?  ?  ?  ?  ?  ?  ? ? ?Hand Dominance  ? Dominant Hand: Right ? ?  ?Extremity/Trunk Assessment  ? Upper Extremity Assessment ?Upper Extremity Assessment: Defer to OT evaluation ?  ? ?Lower Extremity Assessment ?Lower Extremity Assessment: Generalized weakness (Slow rise to stand, noted pt dependent on UEs for support) ?  ? ?   ?Communication  ? Communication: No difficulties  ?Cognition Arousal/Alertness: Awake/alert ?Behavior During Therapy: [facility] for tasks assessed/performed ?Overall Cognitive Status: Within Functional Limits for tasks assessed ?  ?  ?  ?  ?  ?  ?  ?  ?  ?  ?  ?  ?  ?  ?  ?  ?  ?  ?  ? ?  ?General Comments General comments (skin integrity, edema, etc.):  ? ? ? 03/04/22 0927 03/04/22 0928  ?Vital Signs  ?Patient Position (if appropriate) Orthostatic Vitals  --   ?Orthostatic Lying   ?BP- Lying 109/76  --   ?Orthostatic Sitting  ?BP- Sitting 125/79 106/79 ?(After standing at sink approx 3 minutes)  ?Orthostatic Standing at 0 minutes  ?BP- Standing at 0 minutes   ?(Did not get reading)  --   ?Orthostatic Standing at 3 minutes  ?BP- Standing at 3 minutes  ?(Reported dizziness and need to sit; no time to get a reading)  --   ? ? ? ?  ?Exercises    ? ?Assessment/Plan  ?  ?PT Assessment Patient needs continued PT services  ?PT Problem List Decreased strength;Decreased activity tolerance;Decreased balance;Decreased mobility;Decreased knowledge of use of DME;Decreased safety awareness;Decreased knowledge of precautions;Cardiopulmonary status limiting activity;Pain ? ?   ?  ?PT Treatment Interventions DME instruction;Gait training;Stair training;Functional mobility training;Therapeutic activities;Therapeutic exercise;Balance training;Patient/family education   ? ?PT Goals (Current goals can be found in the Care Plan section)  ?Acute Rehab PT Goals ?Patient Stated Goal: Be done with headache and get home ?PT Goal Formulation: With patient ?Time For Goal Achievement: 03/18/22 ?Potential to Achieve Goals: Good ?Additional Goals ?Additional Goal #1: Pt's Orthostatic vital signs will check out normal (without SBP drop of more than , and without HR incr of more than 20bpm) ? ?  ?Frequency Min 3X/week ?  ? ? ?Co-evaluation  PT/OT/SLP Co-Evaluation/Treatment: Yes ?Reason for Co-Treatment: For patient/therapist safety ?PT goals addressed during session: Mobility/safety with mobility ?  ?  ? ? ?  ?AM-PAC PT "6 Clicks" Mobility  ?Outcome Measure Help needed turning from your back to your side while in a flat bed without using bedrails?: None ?Help needed moving from lying on your back to sitting on the side of a flat bed without using bedrails?: None ?Help needed moving to and from a bed to a chair (including a wheelchair)?: A Little ?Help needed standing up from a chair using your arms (e.g., wheelchair or bedside chair)?: A Little ?Help needed to walk in hospital room?: A Little ?Help needed climbing 3-5 steps with a railing? : A Lot ?6 Click Score: 19 ? ?  ?End of Session   ?Activity  Tolerance: Patient limited by fatigue;Other (comment) (Limited by dizziness with standing) ?Patient left: in bed;with call bell/phone within reach;with family/visitor present ?Nurse Communication: Mobilit

## 2022-03-04 NOTE — Progress Notes (Signed)
Pt returned from MRI °

## 2022-03-05 DIAGNOSIS — G0490 Encephalitis and encephalomyelitis, unspecified: Secondary | ICD-10-CM | POA: Diagnosis not present

## 2022-03-05 DIAGNOSIS — R569 Unspecified convulsions: Secondary | ICD-10-CM | POA: Diagnosis not present

## 2022-03-05 DIAGNOSIS — B029 Zoster without complications: Secondary | ICD-10-CM | POA: Diagnosis not present

## 2022-03-05 LAB — CBC WITH DIFFERENTIAL/PLATELET
Abs Immature Granulocytes: 0.01 10*3/uL (ref 0.00–0.07)
Basophils Absolute: 0 10*3/uL (ref 0.0–0.1)
Basophils Relative: 1 %
Eosinophils Absolute: 0.2 10*3/uL (ref 0.0–0.5)
Eosinophils Relative: 4 %
HCT: 38.3 % — ABNORMAL LOW (ref 39.0–52.0)
Hemoglobin: 12.5 g/dL — ABNORMAL LOW (ref 13.0–17.0)
Immature Granulocytes: 0 %
Lymphocytes Relative: 42 %
Lymphs Abs: 1.8 10*3/uL (ref 0.7–4.0)
MCH: 28.3 pg (ref 26.0–34.0)
MCHC: 32.6 g/dL (ref 30.0–36.0)
MCV: 86.8 fL (ref 80.0–100.0)
Monocytes Absolute: 0.4 10*3/uL (ref 0.1–1.0)
Monocytes Relative: 8 %
Neutro Abs: 1.9 10*3/uL (ref 1.7–7.7)
Neutrophils Relative %: 45 %
Platelets: 184 10*3/uL (ref 150–400)
RBC: 4.41 MIL/uL (ref 4.22–5.81)
RDW: 13.7 % (ref 11.5–15.5)
WBC: 4.3 10*3/uL (ref 4.0–10.5)
nRBC: 0 % (ref 0.0–0.2)

## 2022-03-05 LAB — MAGNESIUM: Magnesium: 2 mg/dL (ref 1.7–2.4)

## 2022-03-05 LAB — COMPREHENSIVE METABOLIC PANEL
ALT: 28 U/L (ref 0–44)
AST: 21 U/L (ref 15–41)
Albumin: 3.2 g/dL — ABNORMAL LOW (ref 3.5–5.0)
Alkaline Phosphatase: 44 U/L (ref 38–126)
Anion gap: 6 (ref 5–15)
BUN: 6 mg/dL (ref 6–20)
CO2: 26 mmol/L (ref 22–32)
Calcium: 8.8 mg/dL — ABNORMAL LOW (ref 8.9–10.3)
Chloride: 108 mmol/L (ref 98–111)
Creatinine, Ser: 1.3 mg/dL — ABNORMAL HIGH (ref 0.61–1.24)
GFR, Estimated: >60 mL/min (ref >60)
Glucose, Bld: 108 mg/dL — ABNORMAL HIGH (ref 70–99)
Potassium: 3.6 mmol/L (ref 3.5–5.1)
Sodium: 140 mmol/L (ref 135–145)
Total Bilirubin: 0.9 mg/dL (ref 0.3–1.2)
Total Protein: 6.6 g/dL (ref 6.5–8.1)

## 2022-03-05 LAB — C-REACTIVE PROTEIN: CRP: 2.2 mg/dL — ABNORMAL HIGH (ref <1.0)

## 2022-03-05 LAB — HSV 1/2 PCR, CSF
HSV-1 DNA: NEGATIVE
HSV-2 DNA: NEGATIVE

## 2022-03-05 LAB — VZV PCR, CSF: VZV PCR, CSF: POSITIVE — AB

## 2022-03-05 LAB — BRAIN NATRIURETIC PEPTIDE: B Natriuretic Peptide: 8.8 pg/mL (ref 0.0–100.0)

## 2022-03-05 LAB — CYTOLOGY - NON PAP

## 2022-03-05 NOTE — Progress Notes (Signed)
Physical Therapy Treatment and Discharge ?Patient Details ?Name: Darrell King ?MRN: [id number] ?DOB: 1966/09/21 ?Today's Date: 03/05/2022 ? ? ?History of Present Illness Pt is a 56 yo male being treated for  Herpes Zoxter (T7 dermatome), when he developed fever, and experienced seizure-like activity whiel at PCP's office, and was taken to the emergency dept, undergoing workup for meningitis;  has a past medical history of Acute gout (12/31/2016), Anxiety, Arthritis, Complication of anesthesia, Dyslipidemia (03/11/2016), Encounter for hepatitis C screening test for low risk patient (02/02/2021), Essential hypertension, benign (02/23/2016), New onset seizure ([place]) (03/02/2022), and Refusal of blood transfusions as patient is Jehovah's Witness.Pt's wife reports he has also been experiencing syncopal symptoms with some medical workup, possibly related to postural hypotension ? ?  ?PT Comments  ? ? Patient mobilizing well with/without IV pole. Denied dizziness and orthostatic BPs were negative (see flowsheet). Ambulation limited by his back pain. No further skilled PT needs identified. All goals met (except stairs deferred due to pt on airborne precautions and do not anticipate he will have difficulty as he has a rail at home). Discharge from PT. ?   ?Recommendations for follow up therapy are one component of a multi-disciplinary discharge planning process, led by the attending physician.  Recommendations may be updated based on patient status, additional functional criteria and insurance authorization. ? ?Follow Up Recommendations ? No PT follow up ?  ?  ?Assistance Recommended at Discharge PRN  ?Patient can return home with the following Assistance with cooking/housework;Help with stairs or ramp for entrance ?  ?Equipment Recommendations ? None recommended by PT  ?  ?Recommendations for Other Services   ? ? ?  ?Precautions / Restrictions Precautions ?Precautions: Fall ?Restrictions ?Weight Bearing Restrictions: No  ?   ? ?Mobility ? Bed Mobility ?Overal bed mobility: Modified Independent ?  ?  ?  ?  ?  ?  ?General bed mobility comments: Slow moving, but able to come to sitting EOB without assist ?  ? ?Transfers ?Overall transfer level: Independent ?Equipment used: None ?Transfers: Sit to/from Stand ?Sit to Stand: Independent ?  ?  ?  ?  ?  ?General transfer comment: able to stand without UE support during orthostatic BPs ?  ? ?Ambulation/Gait ?Ambulation/Gait assistance: Modified independent (Device/Increase time) ?Gait Distance (Feet): 45 Feet ?Assistive device: IV Pole ?Gait Pattern/deviations: Shuffle ?Gait velocity: limited by small room and avoiding obstacles ?  ?  ?General Gait Details: Reports generalized fatigue/weakness and back pain as limiting factors; denied dizziness ? ? ?Stairs ?  ?  ?  ?  ?  ? ? ?Wheelchair Mobility ?  ? ?Modified Rankin (Stroke Patients Only) ?  ? ? ?  ?Balance Overall balance assessment: Independent ?  ?  ?  ?  ?  ?  ?  ?  ?  ?  ?  ?  ?  ?  ?  ?  ?  ?  ?  ? ?  ?Cognition Arousal/Alertness: Awake/alert ?Behavior During Therapy: [facility] for tasks assessed/performed ?Overall Cognitive Status: Within Functional Limits for tasks assessed ?  ?  ?  ?  ?  ?  ?  ?  ?  ?  ?  ?  ?  ?  ?  ?  ?  ?  ?  ? ?  ?Exercises   ? ?  ?General Comments General comments (skin integrity, edema, etc.): Wife present. Pt expresses interest in walking stick and educated on local places to obtain. She plans to obtain. Both understand we  do not have one in the hospital to trial. ?  ?  ? ?Pertinent Vitals/Pain Pain Assessment ?Pain Assessment: Faces ?Faces Pain Scale: Hurts even more ?Pain Location: back, T7 dermatome ?Pain Descriptors / Indicators: Discomfort ?Pain Intervention(s): Limited activity within patient's tolerance, Monitored during session, Repositioned  ? ? ?Home Living   ?  ?  ?  ?  ?  ?  ?  ?  ?  ?   ?  ?Prior Function    ?  ?  ?   ? ?PT Goals (current goals can now be found in the care plan section) Acute Rehab PT  Goals ?Patient Stated Goal: Be done with headache and get home ?PT Goal Formulation: With patient ?Time For Goal Achievement: 03/18/22 ?Potential to Achieve Goals: Good ?Progress towards PT goals: Goals met/education completed, patient discharged from PT (*all goals except stairs; do not anticipate difficulty; pt on airborne precautions and not deemed necessary to leave room to practice steps) ? ?  ?Frequency ? ? ? Min 3X/week ? ? ? ?  ?PT Plan Discharge plan needs to be updated  ? ? ?Co-evaluation   ?  ?  ?  ?  ? ?  ?AM-PAC PT "6 Clicks" Mobility   ?Outcome Measure ? Help needed turning from your back to your side while in a flat bed without using bedrails?: None ?Help needed moving from lying on your back to sitting on the side of a flat bed without using bedrails?: None ?Help needed moving to and from a bed to a chair (including a wheelchair)?: None ?Help needed standing up from a chair using your arms (e.g., wheelchair or bedside chair)?: None ?Help needed to walk in hospital room?: None ?Help needed climbing 3-5 steps with a railing? : None ?6 Click Score: 24 ? ?  ?End of Session   ?Activity Tolerance: Patient limited by pain ?Patient left: with call bell/phone within reach;with family/visitor present;in chair ?Nurse Communication: Mobility status ?PT Visit Diagnosis: Unsteadiness on feet (R26.81);Other abnormalities of gait and mobility (R26.89);Pain;Muscle weakness (generalized) (M62.81);History of falling (Z91.81) ?Pain - part of body:  (back) ?  ? ? ?Time: 0086-7619 ?PT Time Calculation (min) (ACUTE ONLY): 26 min ? ?Charges:  $Gait Training: 8-22 mins ?$Therapeutic Activity: 8-22 mins          ?          ? ? ?Arby Barrette, PT ?Acute Rehabilitation Services  ?Pager [phone] ?Office [phone] ? ? ? ?Jeanie Cooks Haroldine Redler ?03/05/2022, 9:02 AM ? ?

## 2022-03-05 NOTE — Plan of Care (Signed)

## 2022-03-05 NOTE — Progress Notes (Signed)
?      ?                 PROGRESS NOTE ? ?      ?PATIENT DETAILS ?Name: Darrell King ?Age: 56 y.o. ?Sex: male ?Date of Birth: May 23, 1966 ?Admit Date: 03/02/2022 ?Admitting Physician Lequita Halt, MD ?PCP:[place], Blondell Reveal, MD ? ?Brief Summary: ?Patient is a 56 y.o.  male with history of HTN, HLD, aseptic meningitis in 2014-who developed shingles in his right flank area of his torso-started on Valtrex 3 days prior to this hospitalization-subsequently developed fever 1 day prior to this hospitalization-he was at his PCPs office when he was noted to have a generalized tonic-clonic seizure.  He was subsequently sent to the emergency room-and admitted to [facility] service. ? ? ?Significant events: ?5/5>> admit to [facility]-fever/altered mental status/headache-seizure at PCPs office.  Neuro consulted-IV Keppra/IV acyclovir-LP planned under fluoroscopy. ? ?Significant studies: ?5/5>> CT head: No acute abnormalities. ?5/5>> CXR: No PNA ? ?Significant microbiology data: ?5/5>> blood culture: No growth ?5/5>> COVID/influenza: Negative ? ?Procedures: ?None ? ?Consults: ?Neurology, IR ? ?Subjective:  Patient in bed, appears comfortable, denies any headache, no fever, no chest pain or pressure, no shortness of breath , no abdominal pain. No new focal weakness. ? ? ?Objective: ?Vitals: ?Blood pressure 114/78, pulse 67, temperature 97.7 ?F (36.5 ?C), temperature source Oral, resp. rate 14, SpO2 96 %.  ? ?Exam: ? ?Awake Alert, No new F.N deficits, Normal affect ?[place].AT,PERRAL ?Supple Neck, No JVD,   ?Symmetrical Chest wall movement, Good air movement bilaterally, CTAB ?RRR,No Gallops, Rubs or new Murmurs,  ?+ve B.Sounds, Abd Soft, No tenderness,   ?No Cyanosis, Clubbing or edema  ? ? ? ?Assessment/Plan: ? ?Fever/headache/seizures/confusion: Suspicion for VZV encephalitis-given ongoing active shingles in his right T7 dermatome.  Empirically covered with acyclovir-EEG and MRI both nonacute, CSF serology pending, case discussed with neurologist  Dr. Leonel Ramsay on 03/04/2022.  Monitor on acyclovir. ? ?Right T7 dermatome shingles: On IV acyclovir-on gabapentin for neuropathic pain.  Overall improving. ? ?AKI: Mild hemodynamically mediated kidney injury also on acyclovir, hydrate and monitor ? ?Hypokalemia: Replete and recheck. ? ?HTN: Stable-continue amlodipine. ? ?HLD: Continue statin ? ?Obesity: ?Estimated body mass index is 34.71 kg/m? as calculated from the following: ?  Height as of an earlier encounter on 03/02/22: 6\' 7"  (2.007 m). ?  Weight as of an earlier encounter on 03/02/22: 139.8 kg.  ? ?Code status: ?  Code Status: Full Code  ? ?DVT Prophylaxis: ?enoxaparin (LOVENOX) injection 40 mg Start: 03/04/22 1000 ?  ?Family Communication: Spouse at bedside ? ? 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 ?  ?Planned Discharge Destination:Home ? ? ?Diet: ?Diet Order   ? ?       ?  Diet Heart Room service appropriate? Yes; Fluid consistency: Thin  Diet effective now       ?  ? ?  ?  ? ?  ?  ? ?MEDICATIONS: ?Scheduled Meds: ? amLODipine  5 mg Oral Daily  ? enoxaparin (LOVENOX) injection  40 mg Subcutaneous Q24H  ? gabapentin  300 mg Oral TID  ? rosuvastatin  5 mg Oral Daily  ? ?Continuous Infusions: ? acyclovir 1,000 mg (03/05/22 1027)  ? lactated ringers 75 mL/hr at 03/05/22 0615  ? levETIRAcetam 500 mg (03/05/22 1029)  ? ?PRN Meds:.acetaminophen **OR** acetaminophen, butalbital-acetaminophen-caffeine, cyclobenzaprine, ketorolac, LORazepam ? ? ?I have personally reviewed following labs and imaging studies ? ?  LABORATORY DATA: ? ?Recent Labs  ?Lab 03/02/22 ?0924 03/03/22 ?0110 03/04/22 ?[id number] 03/05/22 ?0134  ?WBC 4.9  6.3 4.6 4.2 4.3  ?HGB 14.3  14.6 13.2 12.8* 12.5*  ?HCT 42.3  42.0 38.6* 37.9* 38.3*  ?PLT 178  177 160 160 184  ?MCV 83.9  84 84.8 85.0 86.8  ?MCH 29.1  28.4 29.0 28.7 28.3  ?MCHC 34.8  33.8 34.2 33.8 32.6  ?RDW 13.9  13.5  13.7 13.7 13.7  ?LYMPHSABS 1.5  2.2  --   --  1.8  ?MONOABS 0.9  --   --  0.4  ?EOSABS 0.1  0.1  --   --  0.2  ?BASOSABS 0.0  0.0  --   --  0.0  ? ? ?Recent Labs  ?Lab 03/02/22 ?[id number] 03/02/22 ?[id number] 03/02/22 ?1133 03/03/22 ?0110 03/04/22 ?[id number] 03/05/22 ?0134  ?NA  --  137  135  --  137 140 140  ?K  --  4.1  3.6  --  3.2* 3.7 3.6  ?CL  --  101  103  --  106 108 108  ?CO2  --  22  23  --  26 25 26   ?GLUCOSE  --  126*  99  --  89 100* 108*  ?BUN  --  10  12  --  8 9 6   ?CREATININE  --  1.40*  1.35*  --  1.31* 1.30* 1.30*  ?CALCIUM  --  9.0  9.2  --  8.4* 8.5* 8.8*  ?AST  --  22  20  --   --   --  21  ?ALT  --  26  26  --   --   --  28  ?ALKPHOS  --  61  51  --   --   --  44  ?BILITOT  --  2.0*  1.7*  --   --   --  0.9  ?ALBUMIN  --  4.0  4.3  --   --   --  3.2*  ?MG  --   --   --   --  2.1 2.0  ?CRP  --   --   --  2.4*  --  2.2*  ?LATICACIDVEN 1.5  --  1.2  --   --   --   ?INR  --  1.0  --   --   --   --   ?BNP  --   --   --   --   --  8.8  ? ? ? ?RADIOLOGY STUDIES/RESULTS: ?MR BRAIN W WO CONTRAST ? ?Result Date: 03/04/2022 ?CLINICAL DATA:  Meningitis/CNS infection suspected encephalitis. EXAM: MRI HEAD WITHOUT AND WITH CONTRAST TECHNIQUE: Multiplanar, multiecho pulse sequences of the brain and surrounding structures were obtained without and with intravenous contrast. CONTRAST:  66mL GADAVIST GADOBUTROL 1 MMOL/ML IV SOLN COMPARISON:  None Available. FINDINGS: Brain: No acute infarct, mass effect or extra-axial collection. Focus of chronic microhemorrhage in the left frontal lobe. Normal white matter signal. Generalized volume loss. The midline structures are normal. There is an area magnetic susceptibility effect at the medial undersurface of the right tentorial leaflet. There is no abnormal contrast enhancement. Vascular: Major flow voids are preserved. Skull and upper cervical spine: Normal calvarium and skull base. Visualized upper cervical spine and soft tissues are normal. Sinuses/Orbits:No paranasal  sinus fluid levels or advanced mucosal thickening. No mastoid or middle ear effusion. Normal orbits. IMPRESSION: 1. No acute intracranial abnormality. 2. Focus of magnetic susceptibility effect at  the paramedian undersurface of the right tentorial leaflet, likely an area of remote hemorrhage. Electronically Signed   By: Ulyses Jarred M.D.   On: 03/04/2022 22:24  ? ?EEG adult ? ?Result Date: 03/04/2022 ?Lora Havens, MD     03/04/2022  7:24 PM Patient Name: DEKLEN HEYDON MRN: [id number] Epilepsy Attending: Lora Havens Referring Physician/Provider: Kerney Elbe, MD Date: 03/04/2022 Duration: 21.38 mins Patient history: 56 y.o.  male with history of HTN, HLD, aseptic meningitis in 2014-who developed shingles in his right flank area of his torso-started on Valtrex 3 days prior to this hospitalization-subsequently developed fever 1 day prior to this hospitalization-he was at his PCPs office when he was noted to have a generalized tonic-clonic seizure. EEG to evaluate for seizure Level of alertness: Awake, asleep AEDs during EEG study: LEV, GBP Technical aspects: This EEG study was done with scalp electrodes positioned according to the 10-20 International system of electrode placement. Electrical activity was acquired at a sampling rate of 500Hz  and reviewed with a high frequency filter of 70Hz  and a low frequency filter of 1Hz . EEG data were recorded continuously and digitally stored. Description: No clear posterior dominant rhythm was seen. Sleep was characterized by vertex waves, sleep spindles (12 to 14 Hz), maximal frontocentral region.  There is an excessive amount of 15 to 18 Hz beta activity distributed symmetrically and diffusely. Hyperventilation and photic stimulation were not performed.   ABNORMALITY - Excessive beta, generalized IMPRESSION: This study is within normal limits. The excessive beta activity seen in the background is most likely due to the effect of benzodiazepine and is a benign EEG  pattern. No seizures or epileptiform discharges were seen throughout the recording. Priyanka [name]  ? ?DG FL GUIDED LUMBAR PUNCTURE ? ?Result Date: 03/03/2022 ?CLINICAL DATA:  56 yo male patient who developed shingles in his righ

## 2022-03-05 NOTE — Progress Notes (Signed)
Pharmacy Antibiotic Note ? ?CALHOUN King is a 56 y.o. 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 Of note, recent outpatient electronic visit on 02/28/22 for rash that was found be shingles and given 7 day supply of valacyclovir. On presentation, WBC WNL and Tmax 100.9 F. Ceftriaxone 2g q12h started in ED. Pharmacy has been consulted for acyclovir dosing. ? ?HSV and VZV PCR still pending, CTX d/c'd per primary. Scr stable.  ? ?Plan: ?Continue acyclovir IV 1000mg  q8h  ?Continue LR @ 49mL/h  ?F/u renal function, clinical course, and length of therapy ? ?  ? ?Temp (24hrs), Avg:97.9 ?F (36.6 ?C), Min:97.7 ?F (36.5 ?C), Max:98.3 ?F (36.8 ?C) ? ?Recent Labs  ?Lab 03/02/22 ?05/02/22 03/02/22 ?05/02/22 03/02/22 ?1133 03/03/22 ?0110 03/04/22 ?05/04/22 03/05/22 ?0134  ?WBC  --  4.9  6.3  --  4.6 4.2 4.3  ?CREATININE  --  1.40*  1.35*  --  1.31* 1.30* 1.30*  ?LATICACIDVEN 1.5  --  1.2  --   --   --   ? ?  ?Estimated Creatinine Clearance: 103 mL/min (A) (by C-G formula based on SCr of 1.3 mg/dL (H)).   ? ?No Known Allergies ? ?Antimicrobials this admission: ?Acyclovir 5/5 > ?Ceftriaxone 5/5 >5/7 ? ?Dose adjustments this admission: ? ? ?Microbiology results: ?5/5 BCx: NGTD ? ?Hakeem Frazzini A. 05/05/22, PharmD, BCPS, FNKF ?Clinical Pharmacist ?[place] ?Please utilize Amion for appropriate phone number to reach the unit pharmacist [facility]) ? ?03/05/2022 8:36 AM ? ?

## 2022-03-05 NOTE — Progress Notes (Signed)
Neurology Progress Note ? ? ?S:// ?Patient sitting in the chair. Family is at bedside. HSV and VZV PCR pending. EEG no seizures identified. MRI Brain no acute abnormality. He states he did not sleep well. Family has not noticed any further seizure activity.  ? ? ?O:// ?Current vital signs: ?BP 114/78 (BP Location: Right Arm)   Pulse 67   Temp 97.7 ?F (36.5 ?C) (Oral)   Resp 14   SpO2 96%  ?Vital signs in last 24 hours: ?Temp:  [97.7 ?F (36.5 ?C)-98.3 ?F (36.8 ?C)] 97.7 ?F (36.5 ?C) (05/08 0732) ?Pulse Rate:  [65-78] 67 (05/08 0732) ?Resp:  [14-18] 14 (05/08 0732) ?BP: (106-120)/(72-85) 114/78 (05/08 0732) ?SpO2:  [94 %-96 %] 96 % (05/08 0732) ? ?GENERAL: Awake, alert in NAD ?HEENT: - Normocephalic and atraumatic, dry mm ?LUNGS - Clear to auscultation bilaterally with no wheezes ?CV - S1S2 RRR, no m/r/g, equal pulses bilaterally. ?ABDOMEN - Soft, nontender, nondistended with normoactive BS ?Ext: warm, well perfused, intact peripheral pulses, no edema ?NEURO:  ?Mental Status: AA&Ox4 ?Language: speech is clear.  Naming, repetition, fluency, and comprehension intact. ?Cranial Nerves: PERRL 2 mm/brisk. EOMI, visual fields full, no facial asymmetry, facial sensation intact, hearing intact, tongue/uvula/soft palate midline, normal sternocleidomastoid and trapezius muscle strength. No evidence of tongue atrophy or fibrillations ?Motor: RUE and RLE 5/5, LUE and LLE 4/5  ?Tone: is normal and bulk is normal ?Sensation- decreased on right  ?Coordination: FTN intact bilaterally, no ataxia in BLE. ?Gait- deferred ? ? ?Medications ? ?Current Facility-Administered Medications:  ?  acetaminophen (TYLENOL) tablet 650 mg, 650 mg, Oral, Q6H PRN, 650 mg at 03/04/22 0858 **OR** acetaminophen (TYLENOL) suppository 650 mg, 650 mg, Rectal, Q6H PRN, Lequita Halt, MD ?  acyclovir (ZOVIRAX) 1,000 mg in dextrose 5 % 150 mL IVPB, 1,000 mg, Intravenous, Q8H, Levonne Spiller, RPH, Last Rate: 170 mL/hr at 03/05/22 0109, 1,000 mg at 03/05/22  0109 ?  amLODipine (NORVASC) tablet 5 mg, 5 mg, Oral, Daily, Wynetta Fines T, MD, 5 mg at 03/04/22 0857 ?  butalbital-acetaminophen-caffeine (FIORICET) 50-325-40 MG per tablet 1 tablet, 1 tablet, Oral, Q6H PRN, Lequita Halt, MD, 1 tablet at 03/05/22 0106 ?  cyclobenzaprine (FLEXERIL) tablet 10 mg, 10 mg, Oral, TID PRN, Wynetta Fines T, MD, 10 mg at 03/05/22 0518 ?  enoxaparin (LOVENOX) injection 40 mg, 40 mg, Subcutaneous, Q24H, Ghimire, Henreitta Leber, MD, 40 mg at 03/04/22 0857 ?  gabapentin (NEURONTIN) capsule 300 mg, 300 mg, Oral, TID, Wynetta Fines T, MD, 300 mg at 03/04/22 2218 ?  ketorolac (TORADOL) 15 MG/ML injection 30 mg, 30 mg, Intravenous, Q6H PRN, Jonetta Osgood, MD, 30 mg at 03/05/22 [id number] ?  lactated ringers infusion, , Intravenous, Continuous, Thurnell Lose, MD, Last Rate: 75 mL/hr at 03/05/22 0615, New Bag at 03/05/22 0615 ?  levETIRAcetam (KEPPRA) IVPB 500 mg/100 mL premix, 500 mg, Intravenous, BID, Kerney Elbe, MD, Last Rate: 400 mL/hr at 03/04/22 2218, 500 mg at 03/04/22 2218 ?  LORazepam (ATIVAN) injection 2 mg, 2 mg, Intravenous, Q4H PRN, Wynetta Fines T, MD ?  rosuvastatin (CRESTOR) tablet 5 mg, 5 mg, Oral, Daily, Wynetta Fines T, MD, 5 mg at 03/04/22 0857 ? ?Imaging ?I have reviewed images in epic and the results pertinent to this consultation are: ? ?CT-scan of the brain 5/5: ?No acute process  ? ?MRI Brain 5/8: ?1. No acute intracranial abnormality. ?2. Focus of magnetic susceptibility effect at the paramedian undersurface of the right tentorial leaflet, likely an area of remote  hemorrhage. ? ?Assessment:  ?56 y.o.  male with history of HTN, HLD, aseptic meningitis in 2014-who developed shingles in his right flank area of his torso-started on Valtrex 3 days prior to this hospitalization-subsequently developed fever 1 day prior to this hospitalization-he was at his PCPs office when he was noted to have a generalized tonic-clonic seizure. He was sitting in a chair when per wife he suddenly started  staring blankly, eyes deviated to the right and limbs then started jerking, with generalized tonic-clonic seizure activity. The seizure lasted for about 2 minutes, followed by an approximately 10 minute long postictal period. 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 He was loaded with 3000 mg IV Keppra. ? ?CSF samples sent : ?CSF cell count 17,250 RBC ? 34 WBC's ? Lymphs 82 ?HSV and VZV PCR pending ? 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 ?Recommendations: ?- continue Keppra 500mg  BID  ?- Continue empiric IV Abx ?- Follow CSF labs  ?- Neurology will continue to follow  ? ?Beulah Gandy DNP, ACNPC-AG  ? ?

## 2022-03-06 ENCOUNTER — Inpatient Hospital Stay: Payer: Self-pay

## 2022-03-06 DIAGNOSIS — R569 Unspecified convulsions: Secondary | ICD-10-CM | POA: Diagnosis not present

## 2022-03-06 DIAGNOSIS — B029 Zoster without complications: Secondary | ICD-10-CM | POA: Diagnosis not present

## 2022-03-06 DIAGNOSIS — B0111 Varicella encephalitis and encephalomyelitis: Secondary | ICD-10-CM

## 2022-03-06 LAB — COMPREHENSIVE METABOLIC PANEL
ALT: 25 U/L (ref 0–44)
AST: 17 U/L (ref 15–41)
Albumin: 3.1 g/dL — ABNORMAL LOW (ref 3.5–5.0)
Alkaline Phosphatase: 45 U/L (ref 38–126)
Anion gap: 6 (ref 5–15)
BUN: 9 mg/dL (ref 6–20)
CO2: 25 mmol/L (ref 22–32)
Calcium: 8.7 mg/dL — ABNORMAL LOW (ref 8.9–10.3)
Chloride: 109 mmol/L (ref 98–111)
Creatinine, Ser: 1.15 mg/dL (ref 0.61–1.24)
GFR, Estimated: >60 mL/min (ref >60)
Glucose, Bld: 95 mg/dL (ref 70–99)
Potassium: 3.9 mmol/L (ref 3.5–5.1)
Sodium: 140 mmol/L (ref 135–145)
Total Bilirubin: 0.5 mg/dL (ref 0.3–1.2)
Total Protein: 6.5 g/dL (ref 6.5–8.1)

## 2022-03-06 LAB — CSF CULTURE W GRAM STAIN: Culture: NO GROWTH

## 2022-03-06 LAB — C-REACTIVE PROTEIN: CRP: 2.5 mg/dL — ABNORMAL HIGH (ref <1.0)

## 2022-03-06 LAB — CBC WITH DIFFERENTIAL/PLATELET
Abs Immature Granulocytes: 0.02 10*3/uL (ref 0.00–0.07)
Basophils Absolute: 0 10*3/uL (ref 0.0–0.1)
Basophils Relative: 0 %
Eosinophils Absolute: 0.2 10*3/uL (ref 0.0–0.5)
Eosinophils Relative: 4 %
HCT: 36.9 % — ABNORMAL LOW (ref 39.0–52.0)
Hemoglobin: 12.1 g/dL — ABNORMAL LOW (ref 13.0–17.0)
Immature Granulocytes: 0 %
Lymphocytes Relative: 42 %
Lymphs Abs: 2.1 10*3/uL (ref 0.7–4.0)
MCH: 28.4 pg (ref 26.0–34.0)
MCHC: 32.8 g/dL (ref 30.0–36.0)
MCV: 86.6 fL (ref 80.0–100.0)
Monocytes Absolute: 0.4 10*3/uL (ref 0.1–1.0)
Monocytes Relative: 8 %
Neutro Abs: 2.2 10*3/uL (ref 1.7–7.7)
Neutrophils Relative %: 46 %
Platelets: 191 10*3/uL (ref 150–400)
RBC: 4.26 MIL/uL (ref 4.22–5.81)
RDW: 13.7 % (ref 11.5–15.5)
WBC: 4.9 10*3/uL (ref 4.0–10.5)
nRBC: 0 % (ref 0.0–0.2)

## 2022-03-06 LAB — MAGNESIUM: Magnesium: 2 mg/dL (ref 1.7–2.4)

## 2022-03-06 LAB — BRAIN NATRIURETIC PEPTIDE: B Natriuretic Peptide: 14.2 pg/mL (ref 0.0–100.0)

## 2022-03-06 MED ORDER — SODIUM CHLORIDE 0.9% FLUSH: 10.0000 mL | Freq: Two times a day (BID) | INTRAVENOUS | Status: DC

## 2022-03-06 MED ORDER — LACTATED RINGERS IV SOLN: INTRAVENOUS | Status: DC

## 2022-03-06 MED ORDER — ACYCLOVIR SODIUM 50 MG/ML IV SOLN: 1000.0000 mg | Freq: Three times a day (TID) | INTRAVENOUS | 0 refills | Status: AC

## 2022-03-06 MED ORDER — CHLORHEXIDINE GLUCONATE CLOTH 2 % EX PADS
6.0000 | MEDICATED_PAD | Freq: Every day | CUTANEOUS | Status: DC
  Administered 2022-03-06 – 2022-03-07 (×2): 6 via TOPICAL

## 2022-03-06 MED ORDER — SODIUM CHLORIDE 0.9% FLUSH: 10.0000 mL | INTRAVENOUS | Status: DC | PRN

## 2022-03-06 NOTE — Consult Note (Addendum)
? ?I have seen and examined the patient. I have personally reviewed the clinical findings, laboratory findings, microbiological data and imaging studies. The assessment and treatment plan was discussed with the Nurse Practitioner Mauricio Po. I agree with her/his recommendations except following additions/corrections. ? ?Shingles localized primarily in the rt T7 dermatome, appears drying and crusting. 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 Evaluated by Neurology with unremarkable MRI brain and EEG ? ?Continues to have headache/some photophobianeck pain rt hand numbness - however appear to be improving per wife ? ?Awake, alert and oriented, Fluent speech with intact comprehension ?Pupils b/l symmetrical, hearing intact. No facial symmetry  ?Power 4+ in all extremities ?Grossly non focal neuro exam  ?Heart/lung and abomen exam - WNL ?Skin: ? ? ? ? ?Agree with completion of 2 weeks course of IV acyclovir via PICC. PICC to be removed after completion of course of IV acyclovir ?Will arrange for ID fu in 2 weeks ?Monitor BMP twice in a week while on acyclovir  ?ID will sign off, please call with questions  ? ?Rosiland Oz, MD ?Infectious Disease Physician ?[facility] ?[address] ?[place], [place] [address] ?Phone: [phone]  Fax: [phone] ? ?[facility]   ? ?Date of Admission:  03/02/2022    ? ?Total days of antibiotics  ?        ?      ?Reason for Consult: Varicella Encephalitis    ?Referring Provider: Dr. Lala Lund ?Primary Care Provider: [place], Blondell Reveal, MD ? ? ?ASSESSMENT: ? 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 Continues to have headaches and photophobia. Anticipate symptoms to resolve gradually. Rash appears to be crusting  over.  Will need PICC for acyclovir course of 14 days with end date of 03/16/22. Will place OPAT and Home Health orders. Reviewed plan of care with wife present at bedside. Remaining medical and supportive care per primary team.  ? ?PLAN: ? ?Continue acyclovir through 03/16/22 ?PICC line placement.  ?Will arrange for ID follow up  ?Remaining medical and supportive care per primary team.  ? ? 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 ?Culture Result: Varicella Zoster ? ?No Known Allergies ? ?OPAT Orders ?Discharge antibiotics to be given via PICC line ?Discharge antibiotics: Acyclovir  ?Per pharmacy protocol  ?Duration: ?14 days  ?End Date: ?03/16/22  ? ?[facility] Care Per Protocol: ? ?Home health RN for IV administration and teaching; PICC line care and labs.   ? ?Labs weekly while on IV antibiotics: ?_X_ CBC with differential ?_X_ BMP twice a week ?__ CMP ?__ CRP ?__ ESR ?__ Vancomycin trough ?__ CK ? ?_X_ Please pull PIC at completion of IV antibiotics ?__ Please leave PIC in place until doctor has seen patient or been notified ? ?Fax weekly labs to [phone] ? ?Clinic Follow Up Appt: ? ?03/19/22 with Dr. [place] Bali  ? ? ?Principal Problem: ?  New onset seizure ([place]) ?Active Problems: ?  Essential hypertension, benign ?  Herpes zoster ?  Viral encephalitis ?  Encephalitis ?  Varicella encephalitis ? ? ? amLODipine  5 mg Oral Daily  ? enoxaparin (LOVENOX) injection  40 mg Subcutaneous Q24H  ? gabapentin  300 mg Oral TID  ? rosuvastatin  5 mg Oral Daily  ? ? ? ?HPI: Darrell King is a 56 y.o.  male with previous medical history of hypertension, Gout, Dyslipidemia and aseptic meningitis in 2014 admitted with altered mental status and new onset seizure.  ? ?Darrell King was seen through [facility] on 02/28/22 with acute onset rash and diagnosed with Shingles for which he was prescribed Valacyclovir. At the time he was also having increased gas and constipation which was recommended following up in person for and was seen on 03/02/22 after  developing a fever of 104 F at home. During visit in Internal Medicine became unresponsive and had tonic-clonic seizure activity. Transferred to [facility] for IR guided lumbar puncture following negative CT head. CSF results with WBC count of 34, 82% neutrophils, glucose 62 and protein 38. Started on acyclovir and Keppra. MRI brain with no acute intracranial abnormality.  ? ?Darrell King has been afebrile since 03/03/22 with no leukocytosis. Currently on Day 5 of acyclovir. Completed 3 days of Ceftriaxone. Blood cultures are without growth. VZV CSF is positive with HSV 1/HSV-2 negative. Continues to have headaches and photophobia and is improved since admission. Rash is crusting over.  ? ?Review of Systems: ?Review of Systems  ?Constitutional:  Negative for chills, fever and weight loss.  ?Respiratory:  Negative for cough, shortness of breath and wheezing.   ?Cardiovascular:  Negative for chest pain and leg swelling.  ?Gastrointestinal:  Negative for abdominal pain, constipation, diarrhea, nausea and vomiting.  ?Skin:  Negative for rash.  ?Neurological:  Positive for weakness and headaches.  ? ? ?Past Medical History:  ?Diagnosis Date  ? Acute gout 12/31/2016  ? Anxiety   ? "had taken Paxil; back in 1996; nothing since" (10/21/2013)  ? Arthritis   ? Complication of anesthesia   ? SLOW TO WAKE UP AFTER SURGERY IN 2019  ? Dyslipidemia 03/11/2016  ? 12.5% 10-year risk of heart disease or stroke, should be on a moderate/high intensity statin.    ? Encounter for hepatitis C screening test for low risk patient 02/02/2021  ? Essential hypertension, benign 02/23/2016  ? New onset seizure ([place]) 03/02/2022  ? Refusal of blood transfusions as patient is Jehovah's Witness   ? ?Past Surgical History:  ?Procedure Laterality Date  ? COLONOSCOPY W/ POLYPECTOMY    ? INGUINAL HERNIA REPAIR Right 1995  ? INGUINAL HERNIA REPAIR Left 11/12/2013  ? Procedure: HERNIA REPAIR INGUINAL INCARCERATED;  Surgeon: Odis Hollingshead, MD;  Location: [facility];  Service: General;  Laterality: Left;  ? SHOULDER ARTHROSCOPY WITH OPEN ROTATOR CUFF REPAIR AND DISTAL CLAVICLE ACROMINECTOMY Right 06/02/2019  ? Procedure: right shoulder arthroscopy , mini open rotator cuff repair;  Surgeon: Meredith Pel, MD;  Location: [facility];  Service: Orthopedics;  Laterality: Right;  ? SHOULDER ARTHROSCOPY WITH SUBACROMIAL DECOMPRESSION, ROTATOR CUFF REPAIR AND BICEP TENDON REPAIR Right 07/14/2018  ? Procedure: RIGHT SHOULDER ARTHROSCOPY, DEBRIDEMENT, BICEPS TENODESIS, DISTAL CLAVICLE EXCISION AND MINI OPEN ROTATOR CUFF TEAR REPAIR;  Surgeon: Meredith Pel, MD;  Location: [facility];  Service: Orthopedics;  Laterality: Right;  ? ? ? ?Social History  ? ?Tobacco Use  ? Smoking status: Never  ? Smokeless tobacco: Never  ?Vaping Use  ? Vaping Use: Never used  ?Substance Use Topics  ? Alcohol use: Yes  ?  Alcohol/week: 1.0 - 2.0 standard drink  ?  Types: 1 - 2 Shots of liquor per week  ?  Comment: occasionally.  ? Drug use: No  ? ? ?Family History  ?Problem Relation Age of Onset  ? Colon cancer Mother   ?     colon  ?  Cancer Mother   ? Cancer Sister   ?     dont know what type  ? ? ?No Known Allergies ? ?OBJECTIVE: ?Blood pressure (!) 133/94, pulse 78, temperature 98.3 ?F (36.8 ?C), temperature source Oral, resp. rate 15, SpO2 97 %. ? ?Physical Exam ?Constitutional:   ?   General: He is not in acute distress. ?   Appearance: He is well-developed.  ?   Comments: Seated in the chair next to the bed  ?Cardiovascular:  ?   Rate and Rhythm: Normal rate and regular rhythm.  ?   Heart sounds: Normal heart sounds.  ?Pulmonary:  ?   Effort: Pulmonary effort is normal.  ?   Breath sounds: Normal breath sounds.  ?Skin: ?   General: Skin is warm and dry.  ?   Comments: Crusted over appearing rash along right mid back towards the right side  ?Neurological:  ?   Mental Status: He is alert and oriented to person, place, and time.  ? ? ?Lab Results ?Lab Results  ?Component Value Date  ? WBC 4.9  03/06/2022  ? HGB 12.1 (L) 03/06/2022  ? HCT 36.9 (L) 03/06/2022  ? MCV 86.6 03/06/2022  ? PLT 191 03/06/2022  ?  ?Lab Results  ?Component Value Date  ? CREATININE 1.15 03/06/2022  ? BUN 9 03/06/2022  ? NA 140 0

## 2022-03-06 NOTE — Progress Notes (Signed)
PHARMACY CONSULT NOTE FOR: ? ?OUTPATIENT  PARENTERAL ANTIBIOTIC THERAPY (OPAT) ? ?Indication: VZV encephalitis ?Regimen: acyclovir IV 1000 mg q8h ?End date: 03/16/22 ? ?IV antibiotic discharge orders are pended. ?To discharging provider:  please sign these orders via discharge navigator,  ?Select New Orders & click on the button choice - Manage This Unsigned Work.  ?  ? ?Thank you for allowing pharmacy to be a part of this patient's care. ? ?Rushie Goltz ?03/06/2022, 8:58 AM ? ?

## 2022-03-06 NOTE — Progress Notes (Addendum)
Neurology Progress Note ? ? ?S:// ?Patient in bed in NAD. Wife is at bedside. 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 Discussed with patient and wife treatment is acyclovir for 14 days which will require a PICC line and seizure precautions including no driving or operating heavy machinery while under seizure precautions.  Family has not noticed any further seizure activity.  ? ? ?O:// ?Current vital signs: ?BP (!) 133/94 (BP Location: Right Arm)   Pulse 78   Temp 98.3 ?F (36.8 ?C) (Oral)   Resp 15   SpO2 97%  ?Vital signs in last 24 hours: ?Temp:  [97.7 ?F (36.5 ?C)-98.3 ?F (36.8 ?C)] 98.3 ?F (36.8 ?C) (05/09 3437) ?Pulse Rate:  [78-80] 78 (05/09 0726) ?Resp:  [15-20] 15 (05/09 0726) ?BP: (124-133)/(78-94) 133/94 (05/09 0726) ?SpO2:  [96 %-98 %] 97 % (05/09 0726) ? ?GENERAL: Awake, alert in NAD ?HEENT: - Normocephalic and atraumatic, dry mm ?LUNGS - Clear to auscultation bilaterally with no wheezes ?CV - S1S2 RRR, no m/r/g, equal pulses bilaterally. ?ABDOMEN - Soft, nontender, nondistended with normoactive BS ?Ext: warm, well perfused, intact peripheral pulses, no edema ? ?NEURO:  ?Mental Status: AA&Ox4 ?Language: speech is clear.  Naming, repetition, fluency, and comprehension intact. ?Cranial Nerves: PERRL 2 mm/brisk. EOMI, visual fields full, no facial asymmetry, facial sensation intact, hearing intact, tongue/uvula/soft palate midline, normal sternocleidomastoid and trapezius muscle strength. No evidence of tongue atrophy or fibrillations ?Motor: RUE and RLE 5/5, LUE and LLE 4/5  ?Tone: is normal and bulk is normal ?Sensation- decreased on right  ?Coordination: FTN intact bilaterally, no ataxia in BLE. ?Gait- deferred ? ? ?Medications ? ?Current Facility-Administered Medications:  ?  acetaminophen (TYLENOL) tablet 650 mg, 650 mg, Oral, Q6H PRN, 650 mg at 03/04/22 0858 **OR** acetaminophen (TYLENOL) suppository 650 mg, 650 mg, Rectal, Q6H PRN, Emeline General, MD ?  acyclovir (ZOVIRAX) 1,000 mg in dextrose 5 % 150 mL IVPB, 1,000  mg, Intravenous, Q8H, Marja Kays, RPH, Last Rate: 170 mL/hr at 03/06/22 0258, 1,000 mg at 03/06/22 0258 ?  amLODipine (NORVASC) tablet 5 mg, 5 mg, Oral, Daily, Mikey College T, MD, 5 mg at 03/05/22 1026 ?  butalbital-acetaminophen-caffeine (FIORICET) 50-325-40 MG per tablet 1 tablet, 1 tablet, Oral, Q6H PRN, Emeline General, MD, 1 tablet at 03/05/22 0106 ?  cyclobenzaprine (FLEXERIL) tablet 10 mg, 10 mg, Oral, TID PRN, Mikey College T, MD, 10 mg at 03/05/22 1616 ?  enoxaparin (LOVENOX) injection 40 mg, 40 mg, Subcutaneous, Q24H, Ghimire, Werner Lean, MD, 40 mg at 03/05/22 1026 ?  gabapentin (NEURONTIN) capsule 300 mg, 300 mg, Oral, TID, Mikey College T, MD, 300 mg at 03/05/22 2148 ?  ketorolac (TORADOL) 15 MG/ML injection 30 mg, 30 mg, Intravenous, Q6H PRN, Maretta Bees, MD, 30 mg at 03/05/22 2149 ?  levETIRAcetam (KEPPRA) IVPB 500 mg/100 mL premix, 500 mg, Intravenous, BID, Caryl Pina, MD, Last Rate: 400 mL/hr at 03/05/22 2148, 500 mg at 03/05/22 2148 ?  LORazepam (ATIVAN) injection 2 mg, 2 mg, Intravenous, Q4H PRN, Mikey College T, MD ?  rosuvastatin (CRESTOR) tablet 5 mg, 5 mg, Oral, Daily, Mikey College T, MD, 5 mg at 03/05/22 1026 ? ?Imaging ?I have reviewed images in epic and the results pertinent to this consultation are: ? ?CT-scan of the brain 5/5: ?No acute process  ? ?MRI Brain 5/8: ?1. No acute intracranial abnormality. ?2. Focus of magnetic susceptibility effect at the paramedian undersurface of the right tentorial leaflet, likely an area of remote hemorrhage. ? ?Assessment:  ?56 y.o.  male with history of HTN, HLD, aseptic meningitis in 2014-who developed shingles in his right flank area of his torso-started on Valtrex 3 days prior to this hospitalization-subsequently developed fever 1 day prior to this hospitalization-he was at his PCPs office when he was noted to have a generalized tonic-clonic seizure. He was sitting in a chair when per wife he suddenly started staring blankly, eyes deviated to the  right and limbs then started jerking, with generalized tonic-clonic seizure activity. The seizure lasted for about 2 minutes, followed by an approximately 10 minute long postictal period. 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 He was loaded with 3000 mg IV Keppra. ? ?CSF samples sent : ?CSF cell count 17,250 RBC ? 34 WBC's ? Lymphs 82 ?HSV negative  ?VZV PCR positive ? 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 ?Recommendations: ?- Will need PICC line placed prior to discharge to continue acyclovir IV  ?- able to be discharged home from neurology standpoint ?- continue Keppra 500mg  BID  ?- Continue Acyclovir IV for a total of 14 days ?- Will need outpatient follow up with neurology ?- Seizure precautions ?- Neurology will sign off. Please call with questions or concerns ?SEIZURE PRECAUTIONS ?Per [facility] statutes, patients with seizures are not allowed to drive until they have been seizure-free for six months.  ? ?Use caution when using heavy equipment or power tools. Avoid working on ladders or at heights. Take showers instead of baths. Ensure the water temperature is not too high on the home water heater. Do not go swimming alone. Do not lock yourself in a room alone (i.e. bathroom). When caring for infants or small children, sit down when holding, feeding, or changing them to minimize risk of injury to the child in the event you have a seizure. Maintain good sleep hygiene. Avoid alcohol.  ?  ?If patient has another seizure, call 911 and bring them back to the ED if: ?A.  The seizure lasts longer than 5 minutes.      ?B.  The patient doesn't wake shortly after the seizure or has new problems such as difficulty seeing, speaking or moving following the seizure ?C.  The patient was injured during the seizure ?D.  The patient has a temperature over 102 F (39C) ?E.  The patient vomited during the seizure and now is having  trouble breathing  ? ?Beulah Gandy DNP, ACNPC-AG  ? ?Neurology Attending Attestation ? ?I discussed the plan with Ms. Rogers Blocker NP. Above note reflects my findings and recommendations. CSF (+) for VZV encephalitis. Plan to discharge with PICC on acyclovir for total course of 14 days. Seizure precautions per above. I will arrange outpatient f/u. Neuro to sign off, but please re-engage if additional neurologic concerns arise.  ? ?Su Monks, MD ?[facility] ?[phone] ? ?If 7pm- 7am, please page neurology on call as listed in [place]. ? ?

## 2022-03-06 NOTE — Progress Notes (Signed)
Peripherally Inserted Central Catheter Placement ? ?The IV Nurse has discussed with the patient and/or persons authorized to consent for the patient, the purpose of this procedure and the potential benefits and risks involved with this procedure.  The benefits include less needle sticks, lab draws from the catheter, and the patient may be discharged home with the catheter. Risks include, but not limited to, infection, bleeding, blood clot (thrombus formation), and puncture of an artery; nerve damage and irregular heartbeat and possibility to perform a PICC exchange if needed/ordered by physician.  Alternatives to this procedure were also discussed.  Bard Power PICC patient education guide, fact sheet on infection prevention and patient information card has been provided to patient /or left at bedside.   ? ?PICC Placement Documentation  ?PICC Single Lumen 03/06/22 Left Cephalic 51 cm 0 cm (Active)  ?Indication for Insertion or Continuance of Line Home intravenous therapies (PICC only) 03/06/22 1400  ?Exposed Catheter (cm) 0 cm 03/06/22 1400  ?Site Assessment Clean, Dry, Intact 03/06/22 1400  ?Line Status Flushed;Saline locked;Blood return noted 03/06/22 1400  ?Dressing Type Transparent;Securing device 03/06/22 1400  ?Dressing Status Antimicrobial disc in place 03/06/22 1400  ?Safety Lock Not Applicable 03/06/22 1400  ?Line Care Connections checked and tightened 03/06/22 1400  ?Dressing Intervention New dressing 03/06/22 1400  ?Dressing Change Due 03/13/22 03/06/22 1400  ? ? ? ? ? ?Franne Grip Renee ?03/06/2022, 2:07 PM ? ?

## 2022-03-06 NOTE — Progress Notes (Addendum)
Occupational Therapy Treatment ?Patient Details ?Name: Darrell King ?MRN: [id number] ?DOB: 12-06-1965 ?Today's Date: 03/06/2022 ? ? 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), when he developed fever, and experienced seizure-like activity whiel at PCP's office, and was taken to the emergency dept, undergoing workup for meningitis;  has a past medical history of Acute gout (12/31/2016), Anxiety, Arthritis, Complication of anesthesia, Dyslipidemia (03/11/2016), Encounter for hepatitis C screening test for low risk patient (02/02/2021), Essential hypertension, benign (02/23/2016), New onset seizure (HCC) (03/02/2022), and Refusal of blood transfusions as patient is Jehovah's Witness.Pt's wife reports he has also been experiencing syncopal symptoms with some medical workup, possibly related to postural hypotension ?  ?OT comments ? Pt progressing towards goals, able to complete toileting and in room ambulation with min guard A. Pt with mild LOB x1, however able to self correct. Pt keeping eyes closed frequently during session, reporting due to headache pain. Pt presenting with impairments listed below, will follow acutely. Continue to recommend HHOT at d/c.  ? ?Recommendations for follow up therapy are one component of a multi-disciplinary discharge planning process, led by the attending physician.  Recommendations may be updated based on patient status, additional functional criteria and insurance authorization. ?   ?Follow Up Recommendations ? Home health OT  ?  ?Assistance Recommended at Discharge Intermittent Supervision/Assistance  ?Patient can return home with the following ? A little help with walking and/or transfers;A little help with bathing/dressing/bathroom;Assistance with cooking/housework;Assist for transportation ?  ?Equipment Recommendations ? Tub/shower seat  ?  ?Recommendations for Other Services   ? ?  ?Precautions / Restrictions  Precautions ?Precautions: Fall ?Precaution Comments: + orthostatic, watch BP ?Restrictions ?Weight Bearing Restrictions: No  ? ? ?  ? ?Mobility Bed Mobility ?Overal bed mobility: Modified Independent ?  ?  ?  ?  ?  ?  ?General bed mobility comments: slow moving due to pain/headache ?  ? ?Transfers ?Overall transfer level: Needs assistance ?Equipment used: None ?Transfers: Sit to/from Stand ?Sit to Stand: Min guard ?  ?  ?  ?  ?  ?General transfer comment: min guard, mild unsteadiness/LOB x1 able to self correct ?  ?  ?Balance Overall balance assessment: Mild deficits observed, not formally tested ?  ?  ?  ?  ?  ?  ?  ?  ?  ?  ?  ?  ?  ?  ?  ?  ?  ?  ?   ? ?ADL either performed or assessed with clinical judgement  ? ?ADL Overall ADL's : Needs assistance/impaired ?  ?  ?  ?  ?  ?  ?  ?  ?  ?  ?  ?  ?Toilet Transfer: Min guard;Ambulation;Regular Toilet ?Toilet Transfer Details (indicate cue type and reason): pt ambulating to/from bathroom and in room distance multiple times during session ?  ?  ?  ?  ?Functional mobility during ADLs: Min guard ?General ADL Comments: min guard for safety, mild LOB x1 wtih self correction ?  ? ?Extremity/Trunk Assessment Upper Extremity Assessment ?Upper Extremity Assessment: Overall WFL for tasks assessed ?  ?Lower Extremity Assessment ?Lower Extremity Assessment: Defer to PT evaluation ?  ?  ?  ? ?Vision   ?Vision Assessment?: No apparent visual deficits ?  ?Perception Perception ?Perception: Not tested ?  ?Praxis Praxis ?Praxis: Not tested ?  ? ?Cognition Arousal/Alertness: Awake/alert ?Behavior During Therapy: [facility] for tasks assessed/performed ?Overall Cognitive Status: Within Functional Limits for  tasks assessed ?  ?  ?  ?  ?  ?  ?  ?  ?  ?  ?  ?  ?  ?  ?  ?  ?  ?  ?  ?   ?Exercises   ? ?  ?Shoulder Instructions   ? ? ?  ?General Comments wife present and supportive during session  ? ? ?Pertinent Vitals/ Pain       Pain Assessment ?Pain Assessment: Faces ?Pain Score: 6  ?Faces Pain  Scale: Hurts even more ?Pain Location: headache ?Pain Descriptors / Indicators: Discomfort, Constant, Headache ?Pain Intervention(s): Limited activity within patient's tolerance, Monitored during session, Repositioned ? ?Home Living   ?  ?  ?  ?  ?  ?  ?  ?  ?  ?  ?  ?  ?  ?  ?  ?  ?  ?  ? ?  ?Prior Functioning/Environment    ?  ?  ?  ?   ? ?Frequency ? Min 3X/week  ? ? ? ? ?  ?Progress Toward Goals ? ?OT Goals(current goals can now be found in the care plan section) ? Progress towards OT goals: Progressing toward goals ? ?Acute Rehab OT Goals ?Patient Stated Goal: to go home ?OT Goal Formulation: With patient ?Time For Goal Achievement: 03/18/22 ?Potential to Achieve Goals: Good ?ADL Goals ?Pt Will Perform Grooming: with modified independence;standing ?Pt Will Perform Upper Body Dressing: with supervision;sitting;standing ?Pt Will Perform Lower Body Dressing: with supervision;sitting/lateral leans;sit to/from stand ?Pt Will Transfer to Toilet: with supervision;ambulating;regular height toilet ?Pt Will Perform Tub/Shower Transfer: Tub transfer;Shower transfer;shower seat;ambulating  ?Plan Discharge plan remains appropriate;Frequency remains appropriate   ? ?Co-evaluation ? ? ?   ?  ?  ?  ?  ? ?  ?AM-PAC OT "6 Clicks" Daily Activity     ?Outcome Measure ? ? Help from another person eating meals?: None ?Help from another person taking care of personal grooming?: A Little ?Help from another person toileting, which includes using toliet, bedpan, or urinal?: A Little ?Help from another person bathing (including washing, rinsing, drying)?: A Lot ?Help from another person to put on and taking off regular upper body clothing?: A Little ?Help from another person to put on and taking off regular lower body clothing?: A Lot ?6 Click Score: 17 ? ?  ?End of Session   ? ?OT Visit Diagnosis: Unsteadiness on feet (R26.81);Other abnormalities of gait and mobility (R26.89);Muscle weakness (generalized) (M62.81) ?  ?Activity Tolerance  Patient tolerated treatment well;Patient limited by pain ?  ?Patient Left in bed;with call bell/phone within reach;with family/visitor present ?  ?Nurse Communication Mobility status ?  ? ?   ? ?Time: 5003-7048 ?OT Time Calculation (min): 15 min ? ?Charges: OT General Charges ?$OT Visit: 1 Visit ?OT Treatments ?$Self Care/Home Management : 8-22 mins ? ?Alfonzo Beers, OTD, OTR/L ?Acute Rehab ?[phone] ? ? ?Mayer Masker ?03/06/2022, 5:07 PM ?

## 2022-03-06 NOTE — TOC Initial Note (Addendum)
Transition of Care (TOC) - Initial/Assessment Note  ? ? ?Patient Details  ?Name: Darrell King ?MRN: [id number] ?Date of Birth: 08-Jun-1966 ? ?Transition of Care (TOC) CM/SW Contact:    ?Lawerance Sabal, RN ?Phone Number: ?03/06/2022, 9:55 AM ? ?Clinical Narrative:            ?Notified Pam w [facility] of OPAT consult for acyclovir Q8 hrs until 5/19      ?[facility] unable to service, Pam arranged with [facility] for [facility] services. PICC and education today, will plan for DC in the AM ? ? ?Expected Discharge Plan: Home w Home Health Services ?Barriers to Discharge: Continued Medical Work up ? ? ?Patient Goals and CMS Choice ?  ?  ?  ? ?Expected Discharge Plan and Services ?Expected Discharge Plan: Home w Home Health Services ?  ?Discharge Planning Services: CM Consult ?  ?  ?                ?  ?  ?  ?  ?  ?HH Arranged: RN ?  ?  ?  ?  ? ?Prior Living Arrangements/Services ?  ?  ?  ?       ?  ?  ?  ?  ? ?Activities of Daily Living ?Home Assistive Devices/Equipment: None ?ADL Screening (condition at time of admission) ?[name] cognitive ability adequate to safely complete daily activities?: Yes ?Is the patient deaf or have difficulty hearing?: No ?Does the patient have difficulty seeing, even when wearing glasses/contacts?: No ?Does the patient have difficulty concentrating, remembering, or making decisions?: No ?Patient able to express need for assistance with ADLs?: Yes ?Does the patient have difficulty dressing or bathing?: No ?Independently performs ADLs?: Yes (appropriate for developmental age) ?Does the patient have difficulty walking or climbing stairs?: No ?Weakness of Legs: None ?Weakness of Arms/Hands: None ? ?Permission Sought/Granted ?  ?  ?   ?   ?   ?   ? ?Emotional Assessment ?  ?  ?  ?  ?  ?  ? ?Admission diagnosis:  New onset seizure (HCC) [R56.9] ?Viral meningitis, unspecified [A87.9] ?Encephalitis [G04.90] ?Patient Active Problem List  ? Diagnosis Date Noted  ? Varicella encephalitis   ? Abdominal pain  03/02/2022  ? Herpes zoster 03/02/2022  ? 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  ? Encephalitis 03/02/2022  ? Family history of malignant neoplasm of digestive organ 02/05/2022  ? Wellness examination 02/05/2022  ? Left knee pain 12/20/2021  ? Low back pain 10/16/2021  ? Healthcare maintenance 10/16/2021  ? Dermatitis 04/19/2021  ? Obesity (BMI 30-39.9) 02/02/2021  ? Melanonychia 09/22/2019  ? Arthritis of right acromioclavicular joint   ? S/P arthroscopy of right shoulder 06/02/2019  ? Chronic Right Ankle Pain  12/31/2016  ? Dyslipidemia 03/11/2016  ? Essential hypertension, benign 02/23/2016  ? Tendinopathy of right rotator cuff 02/02/2016  ? Numbness and tingling of right upper extremity 11/29/2014  ? Patient is Jehovah's Witness 01/04/2014  ? Left inguinal hernia 11/11/2013  ? ?PCP:  de [place], Raymond J, MD ?Pharmacy:   ?[id number] - [place], [place] - [address] AT [address] ?[address] ?[place] [address] ?Phone: [phone] Fax: [phone] ? ? ? ? ?Social Determinants of Health (SDOH) Interventions ?  ? ?Readmission Risk Interventions ?   ? View : No data to display.  ?  ?  ?  ? ? ? ?

## 2022-03-06 NOTE — Progress Notes (Signed)
?      ?                 PROGRESS NOTE ? ?      ?PATIENT DETAILS ?Name: Darrell King ?Age: 56 y.o. ?Sex: male ?Date of Birth: [birth year] ?Admit Date: 03/02/2022 ?Admitting Physician Emeline General, MD ?PCP:[place], Buren Kos, MD ? ?Brief Summary: ?Patient is a 56 y.o.  male with history of HTN, HLD, aseptic meningitis in 2014-who developed shingles in his right flank area of his torso-started on Valtrex 3 days prior to this hospitalization-subsequently developed fever 1 day prior to this hospitalization-he was at his PCPs office when he was noted to have a generalized tonic-clonic seizure.  He was subsequently sent to the emergency room-and admitted to [facility] service. ? ? ?Significant events: ?5/5>> admit to [facility]-fever/altered mental status/headache-seizure at PCPs office.  Neuro consulted-IV Keppra/IV acyclovir-LP planned under fluoroscopy. ? ?Significant studies: ?5/5>> CT head: No acute abnormalities. ?5/5>> CXR: No PNA ? ?Significant microbiology data: ?5/5>> blood culture: No growth ?5/5>> COVID/influenza: Negative ? ?Procedures: ?None ? ?Consults: ?Neurology, IR ? ?Subjective:  Patient in bed, appears comfortable, denies any headache, no fever, no chest pain or pressure, no shortness of breath , no abdominal pain. No new focal weakness. ? ? ?Objective: ?Vitals: ?Blood pressure (!) 133/94, pulse 78, temperature 98.3 ?F (36.8 ?C), temperature source Oral, resp. rate 15, SpO2 97 %.  ? ?Exam: ? ?Awake Alert, No new F.N deficits, Normal affect ?[place].AT,PERRAL ?Supple Neck, No JVD,   ?Symmetrical Chest wall movement, Good air movement bilaterally, CTAB ?RRR,No Gallops, Rubs or new Murmurs,  ?+ve B.Sounds, Abd Soft, No tenderness,   ?Right T7 dermatome rash has almost dried up. ? ? ?Assessment/Plan: ? ?Fever/headache/seizures/confusion: Suspicion for VZV encephalitis-given ongoing active shingles in his right T7 dermatome.  Rash has dried up on IV acyclovir started on 03/02/2022 with gentle IV fluids, stable EEG and MRI.   CSF serology positive for VZV, PICC line requested on 03/06/2022, will request ID to evaluate as well most likely total 2 weeks of IV acyclovir.  If stable likely discharge on 03/07/2022. ? ?Right T7 dermatome shingles: On IV acyclovir-on gabapentin for neuropathic pain.  Overall improving. ? ?AKI: Mild hemodynamically mediated kidney injury also on acyclovir, hydrate and monitor ? ?Hypokalemia: Replete and recheck. ? ?HTN: Stable-continue amlodipine. ? ?HLD: Continue statin ? ?Obesity: ?Estimated body mass index is 34.71 kg/m? as calculated from the following: ?  Height as of an earlier encounter on 03/02/22: 6\' 7"  (2.007 m). ?  Weight as of an earlier encounter on 03/02/22: 139.8 kg.  ? ?Code status: ?  Code Status: Full Code  ? ?DVT Prophylaxis: ?enoxaparin (LOVENOX) injection 40 mg Start: 03/04/22 1000 ?  ?Family Communication: Spouse at bedside on 03/04/2022, 03/05/2022, 03/06/2022 ? ? 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 ?  ?Planned Discharge Destination:Home ? ? ?Diet: ?Diet Order   ? ?       ?  Diet Heart Room service appropriate? Yes; Fluid consistency: Thin  Diet effective now       ?  ? ?  ?  ? ?  ?  ? ?MEDICATIONS: ?Scheduled Meds: ? amLODipine  5 mg Oral Daily  ? enoxaparin (LOVENOX) injection  40 mg Subcutaneous Q24H  ? gabapentin  300 mg Oral TID  ? rosuvastatin  5 mg Oral Daily  ? ?Continuous Infusions: ? acyclovir 1,000 mg (03/06/22 0258)  ? lactated  ringers    ? levETIRAcetam 500 mg (03/05/22 2148)  ? ?PRN Meds:.acetaminophen **OR** acetaminophen, butalbital-acetaminophen-caffeine, cyclobenzaprine, ketorolac, LORazepam ? ? ?I have personally reviewed following labs and imaging studies ? ?LABORATORY DATA: ? ?Recent Labs  ?Lab 03/02/22 ?0924 03/03/22 ?0110 03/04/22 ?0054 03/05/22 ?0134 03/06/22 ?9357  ?WBC 4.9  6.3 4.6 4.2 4.3 4.9  ?HGB 14.3  14.6 13.2 12.8* 12.5* 12.1*  ?HCT 42.3   42.0 38.6* 37.9* 38.3* 36.9*  ?PLT 178  177 160 160 184 191  ?MCV 83.9  84 84.8 85.0 86.8 86.6  ?MCH 29.1  28.4 29.0 28.7 28.3 28.4  ?MCHC 34.8  33.8 34.2 33.8 32.6 32.8  ?RDW 13.9  13.5 13.7 13.7 13.7 13.7  ?LYMPHSABS 1.5  2.2  --   --  1.8 2.1  ?MONOABS 0.9  --   --  0.4 0.4  ?EOSABS 0.1  0.1  --   --  0.2 0.2  ?BASOSABS 0.0  0.0  --   --  0.0 0.0  ? ? ?Recent Labs  ?Lab 03/02/22 ?0177 03/02/22 ?9390 03/02/22 ?1133 03/03/22 ?0110 03/04/22 ?3009 03/05/22 ?0134 03/06/22 ?2330  ?NA  --  137  135  --  137 140 140 140  ?K  --  4.1  3.6  --  3.2* 3.7 3.6 3.9  ?CL  --  101  103  --  106 108 108 109  ?CO2  --  22  23  --  26 25 26 25   ?GLUCOSE  --  126*  99  --  89 100* 108* 95  ?BUN  --  10  12  --  8 9 6 9   ?CREATININE  --  1.40*  1.35*  --  1.31* 1.30* 1.30* 1.15  ?CALCIUM  --  9.0  9.2  --  8.4* 8.5* 8.8* 8.7*  ?AST  --  22  20  --   --   --  21 17  ?ALT  --  26  26  --   --   --  28 25  ?ALKPHOS  --  61  51  --   --   --  44 45  ?BILITOT  --  2.0*  1.7*  --   --   --  0.9 0.5  ?ALBUMIN  --  4.0  4.3  --   --   --  3.2* 3.1*  ?MG  --   --   --   --  2.1 2.0 2.0  ?CRP  --   --   --  2.4*  --  2.2* 2.5*  ?LATICACIDVEN 1.5  --  1.2  --   --   --   --   ?INR  --  1.0  --   --   --   --   --   ?BNP  --   --   --   --   --  8.8 14.2  ? ? ? ?RADIOLOGY STUDIES/RESULTS: ?MR BRAIN W WO CONTRAST ? ?Result Date: 03/04/2022 ?CLINICAL DATA:  Meningitis/CNS infection suspected encephalitis. EXAM: MRI HEAD WITHOUT AND WITH CONTRAST TECHNIQUE: Multiplanar, multiecho pulse sequences of the brain and surrounding structures were obtained without and with intravenous contrast. CONTRAST:  57mL GADAVIST GADOBUTROL 1 MMOL/ML IV SOLN COMPARISON:  None Available. FINDINGS: Brain: No acute infarct, mass effect or extra-axial collection. Focus of chronic microhemorrhage in the left frontal lobe. Normal white matter signal. Generalized volume loss. The midline structures are normal. There is an area magnetic susceptibility  effect  at the medial undersurface of the right tentorial leaflet. There is no abnormal contrast enhancement. Vascular: Major flow voids are preserved. Skull and upper cervical spine: Normal calvarium and skull base. Visualized upper cervical spine and soft tissues are normal. Sinuses/Orbits:No paranasal sinus fluid levels or advanced mucosal thickening. No mastoid or middle ear effusion. Normal orbits. IMPRESSION: 1. No acute intracranial abnormality. 2. Focus of magnetic susceptibility effect at the paramedian undersurface of the right tentorial leaflet, likely an area of remote hemorrhage. Electronically Signed   By: Deatra RobinsonKevin  Herman M.D.   On: 03/04/2022 22:24  ? ?EEG adult ? ?Result Date: 03/04/2022 ?Charlsie QuestYadav, Priyanka O, MD     03/04/2022  7:24 PM Patient Name: Jearld ShinesCharles B Barraco MRN: [id number] Epilepsy Attending: Charlsie QuestPriyanka O Yadav Referring Physician/Provider: Caryl PinaLindzen, Eric, MD Date: 03/04/2022 Duration: 21.38 mins Patient history: 56 y.o.  male with history of HTN, HLD, aseptic meningitis in 2014-who developed shingles in his right flank area of his torso-started on Valtrex 3 days prior to this hospitalization-subsequently developed fever 1 day prior to this hospitalization-he was at his PCPs office when he was noted to have a generalized tonic-clonic seizure. EEG to evaluate for seizure Level of alertness: Awake, asleep AEDs during EEG study: LEV, GBP Technical aspects: This EEG study was done with scalp electrodes positioned according to the 10-20 International system of electrode placement. Electrical activity was acquired at a sampling rate of 500Hz  and reviewed with a high frequency filter of 70Hz  and a low frequency filter of 1Hz . EEG data were recorded continuously and digitally stored. Description: No clear posterior dominant rhythm was seen. Sleep was characterized by vertex waves, sleep spindles (12 to 14 Hz), maximal frontocentral region.  There is an excessive amount of 15 to 18 Hz beta activity distributed  symmetrically and diffusely. Hyperventilation and photic stimulation were not performed.   ABNORMALITY - Excessive beta, generalized IMPRESSION: This study is within normal limits. The excessive beta activity seen in the background is m

## 2022-03-07 ENCOUNTER — Other Ambulatory Visit (HOSPITAL_COMMUNITY): Payer: Self-pay

## 2022-03-07 DIAGNOSIS — B0111 Varicella encephalitis and encephalomyelitis: Secondary | ICD-10-CM | POA: Diagnosis not present

## 2022-03-07 DIAGNOSIS — B029 Zoster without complications: Secondary | ICD-10-CM | POA: Diagnosis not present

## 2022-03-07 DIAGNOSIS — A86 Unspecified viral encephalitis: Secondary | ICD-10-CM | POA: Diagnosis not present

## 2022-03-07 DIAGNOSIS — G0490 Encephalitis and encephalomyelitis, unspecified: Secondary | ICD-10-CM | POA: Diagnosis not present

## 2022-03-07 LAB — CULTURE, BLOOD (ROUTINE X 2)
Culture: NO GROWTH
Culture: NO GROWTH
Special Requests: ADEQUATE
Special Requests: ADEQUATE

## 2022-03-07 LAB — CBC WITH DIFFERENTIAL/PLATELET
Abs Immature Granulocytes: 0.02 10*3/uL (ref 0.00–0.07)
Basophils Absolute: 0 10*3/uL (ref 0.0–0.1)
Basophils Relative: 0 %
Eosinophils Absolute: 0.2 10*3/uL (ref 0.0–0.5)
Eosinophils Relative: 4 %
HCT: 37.8 % — ABNORMAL LOW (ref 39.0–52.0)
Hemoglobin: 12.4 g/dL — ABNORMAL LOW (ref 13.0–17.0)
Immature Granulocytes: 0 %
Lymphocytes Relative: 46 %
Lymphs Abs: 2.2 10*3/uL (ref 0.7–4.0)
MCH: 28.4 pg (ref 26.0–34.0)
MCHC: 32.8 g/dL (ref 30.0–36.0)
MCV: 86.5 fL (ref 80.0–100.0)
Monocytes Absolute: 0.3 10*3/uL (ref 0.1–1.0)
Monocytes Relative: 6 %
Neutro Abs: 2.1 10*3/uL (ref 1.7–7.7)
Neutrophils Relative %: 44 %
Platelets: 226 10*3/uL (ref 150–400)
RBC: 4.37 MIL/uL (ref 4.22–5.81)
RDW: 13.3 % (ref 11.5–15.5)
WBC: 4.8 10*3/uL (ref 4.0–10.5)
nRBC: 0 % (ref 0.0–0.2)

## 2022-03-07 LAB — MAGNESIUM: Magnesium: 1.9 mg/dL (ref 1.7–2.4)

## 2022-03-07 LAB — COMPREHENSIVE METABOLIC PANEL
ALT: 30 U/L (ref 0–44)
AST: 24 U/L (ref 15–41)
Albumin: 3.1 g/dL — ABNORMAL LOW (ref 3.5–5.0)
Alkaline Phosphatase: 58 U/L (ref 38–126)
Anion gap: 5 (ref 5–15)
BUN: 10 mg/dL (ref 6–20)
CO2: 28 mmol/L (ref 22–32)
Calcium: 8.8 mg/dL — ABNORMAL LOW (ref 8.9–10.3)
Chloride: 108 mmol/L (ref 98–111)
Creatinine, Ser: 1.18 mg/dL (ref 0.61–1.24)
GFR, Estimated: >60 mL/min (ref >60)
Glucose, Bld: 101 mg/dL — ABNORMAL HIGH (ref 70–99)
Potassium: 3.5 mmol/L (ref 3.5–5.1)
Sodium: 141 mmol/L (ref 135–145)
Total Bilirubin: 0.5 mg/dL (ref 0.3–1.2)
Total Protein: 6.7 g/dL (ref 6.5–8.1)

## 2022-03-07 LAB — BRAIN NATRIURETIC PEPTIDE: B Natriuretic Peptide: 13.7 pg/mL (ref 0.0–100.0)

## 2022-03-07 LAB — C-REACTIVE PROTEIN: CRP: 2.7 mg/dL — ABNORMAL HIGH (ref <1.0)

## 2022-03-07 MED ORDER — DEXTROSE 5 % IV SOLN
1000.0000 mg | Freq: Three times a day (TID) | INTRAVENOUS | Status: DC
  Filled 2022-03-07: qty 20

## 2022-03-07 MED ORDER — LEVETIRACETAM 500 MG PO TABS
500.0000 mg | ORAL_TABLET | Freq: Two times a day (BID) | ORAL | 0 refills | Status: DC
  Filled 2022-03-07: qty 60, 30d supply, fill #0

## 2022-03-07 MED ORDER — PANTOPRAZOLE SODIUM 40 MG PO TBEC
40.0000 mg | DELAYED_RELEASE_TABLET | Freq: Every day | ORAL | Status: DC
  Administered 2022-03-07: 40 mg via ORAL
  Filled 2022-03-07: qty 1

## 2022-03-07 MED ORDER — OMEPRAZOLE 40 MG PO CPDR
40.0000 mg | DELAYED_RELEASE_CAPSULE | Freq: Every day | ORAL | 0 refills | Status: DC
Start: 2022-03-08 — End: 2022-03-21
  Filled 2022-03-07: qty 30, 30d supply, fill #0

## 2022-03-07 MED ORDER — VALACYCLOVIR HCL 1 G PO TABS: 1000.0000 mg | ORAL_TABLET | Freq: Three times a day (TID) | ORAL | 0 refills | Status: AC

## 2022-03-07 MED ORDER — BUTALBITAL-APAP-CAFFEINE 50-325-40 MG PO TABS
1.0000 | ORAL_TABLET | Freq: Two times a day (BID) | ORAL | 0 refills | Status: DC | PRN
  Filled 2022-03-07: qty 10, 5d supply, fill #0

## 2022-03-07 NOTE — Discharge Summary (Signed)
?                                                                                ? ?Darrell King TGG:[id number] DOB: [birth year] DOA: 03/02/2022 ? ?PCP: de [place], Raymond J, MD ? ?Admit date: 03/02/2022  Discharge date: 03/07/2022 ? ?Admitted From: Home   Disposition:  Home ? ? ?Recommendations for Outpatient Follow-up:  ? ?Follow up with PCP in 1-2 weeks ? ?PCP Please obtain BMP/CBC, 2 view CXR in 1week,  (see Discharge instructions)  ? ?PCP Please follow up on the following pending results:  ? ? ?Home Health: RN   ?Equipment/Devices: PICC  ?Consultations: Neurology, IR, ID ?Discharge Condition: Stable    ?CODE STATUS: Full    ?Diet Recommendation: Heart Healthy  ? ?Chief Complaint  ?Patient presents with  ? Altered Mental Status  ?  ? ?Brief history of present illness from the day of admission and additional interim summary   ? ?Patient is a 56 y.o.  male with history of HTN, HLD, aseptic meningitis in 2014-who developed shingles in his right flank area of his torso-started on Valtrex 3 days prior to this hospitalization-subsequently developed fever 1 day prior to this hospitalization-he was at his PCPs office when he was noted to have a generalized tonic-clonic seizure.  He was subsequently sent to the emergency room-and admitted to [facility] service. ?  ?  ?Significant events: ?5/5>> admit to [facility]-fever/altered mental status/headache-seizure at PCPs office.  Neuro consulted-IV Keppra/IV acyclovir-LP planned under fluoroscopy. ?  ?Significant studies: ?5/5>> CT head: No acute abnormalities. ?5/5>> CXR: No PNA ?  ?Significant microbiology data: ?5/5>> blood culture: No growth ?5/5>> COVID/influenza: Negative ?  ?Procedures: ?Right arm PICC line placed 03/06/2022. ?  ?Consults: ?Neurology, IR ? ?                                                               Hospital Course  ? ?Varicella-zoster encephalitis/meningitis: Headache, neck  stiffness and seizures, also had active shingles in his right T7 dermatome.  Rash has dried up has been placed on IV acyclovir started on 03/02/2022 of [redacted] weeks along with with gentle IV fluids, stable EEG and MRI.  CSF serology positive for VZV, PICC line was placed on 03/06/2022 he was acyclovir stop date is 03/16/2022, he was seen by neurology along with ID and IR.  Headaches much better flank discomfort much improved, no further seizure activity.  Currently on Keppra as well.  Will follow outpatient with neurology and ID postdischarge along with PCP. ?  ?Right T7 dermatome shingles: On IV acyclovir-on gabapentin for neuropathic pain.  Overall improving. ?  ?AKI: Mild hemodynamically mediated kidney injury also on acyclovir, called after hydration PCP to monitor. ?  ?HTN: Stable-continue amlodipine. ? ?HLD: Continue statin. ?  ?Obesity: Estimated body mass index is 3, follow-up with PCP for weight loss. ? ? ?Discharge diagnosis   ? ? ?Principal Problem: ?  New onset seizure ([place]) ?Active Problems: ?  Essential hypertension, benign ?  Herpes zoster ?  Viral encephalitis ?  Encephalitis ?  Varicella encephalitis ? ? ? ?Discharge instructions   ? ?Discharge Instructions   ? ? [facility] pharmacist to adjust dose for Vancomycin, Aminoglycosides and other anti-infective therapies as requested by physician.   Complete by: As directed ?  ? [facility] to provide Cath Flo 2mg    Complete by: As directed ?  ? Administer for PICC line occlusion and as ordered by physician for other access device issues.  ? Ambulatory referral to Neurology   Complete by: As directed ?  ? An appointment is requested in approximately: 6 wks  ? Anaphylaxis Kit: Provided to treat any anaphylactic reaction to the medication being provided to the patient if First Dose or when requested by physician   Complete by: As directed ?  ? Epinephrine 1mg /ml vial / amp: Administer 0.3mg  (0.37ml) subcutaneously once for moderate to severe  anaphylaxis, nurse to call physician and pharmacy when reaction occurs and call 911 if needed for immediate care  ? Diphenhydramine 50mg /ml IV vial: Administer 25-50mg  IV/IM PRN for first dose reaction, rash, itching, mild reaction, nurse to call physician and pharmacy when reaction occurs  ? Sodium Chloride 0.9% NS 525ml IV: Administer if needed for hypovolemic blood pressure drop or as ordered by physician after call to physician with anaphylactic reaction  ? Change dressing on IV access line weekly and PRN   Complete by: As directed ?  ? Diet - low sodium heart healthy   Complete by: As directed ?  ? Discharge instructions   Complete by: As directed ?  ? Do not drive, operate heavy machinery, perform activities at heights, swimming or participation in water activities or provide baby sitting services until you have seen by Primary MD or a Neurologist and advised to do so again. ? ?Follow with Primary MD de [place], Raymond J, MD in 7 days  ? ?Get CBC, CMP, 2 view Chest X ray -  checked next visit within 1 week by Primary MD   ? ?Activity: As tolerated with Full fall precautions use walker/cane & assistance as needed ? ?Disposition Home  ? ?Diet: Heart Healthy   ? ?Special Instructions: If you have smoked or chewed Tobacco  in the last 2 yrs please stop smoking, stop any regular Alcohol  and or any Recreational drug use. ? ?On your next visit with your primary care physician please Get Medicines reviewed and adjusted. ? ?Please request your Prim.MD to go over all Hospital Tests and Procedure/Radiological results at the follow up, please get all Hospital records sent to your Prim MD by signing hospital release before you go home. ? ?If you experience worsening of your admission symptoms, develop shortness of breath, life threatening emergency, suicidal or homicidal thoughts you must seek medical attention immediately by calling 911 or calling your MD immediately  if symptoms less severe. ? ?You Must read complete  instructions/literature along with all the possible adverse reactions/side effects for all the Medicines you take and that have been prescribed to you. Take any new Medicines after you have completely understood and accpet all the possible adverse reactions/side effects.  ? Flush IV access with Sodium Chloride 0.9% and Heparin 10 units/ml or 100 units/ml   Complete by: As directed ?  ? Home infusion instructions - [facility]   Complete by: As directed ?  ? Instructions: Flush IV access with Sodium Chloride 0.9% and Heparin 10units/ml or 100units/ml  ?  Change dressing on IV access line: Weekly and PRN  ? Instructions Cath Flo 55m: Administer for PICC Line occlusion and as ordered by physician for other access device  ? [facility] pharmacist to adjust dose for: Vancomycin, Aminoglycosides and other anti-infective therapies as requested by physician  ? Increase activity slowly   Complete by: As directed ?  ? Method of administration may be changed at the discretion of home infusion pharmacist based upon assessment of the patient and/or caregiver?s ability to self-administer the medication ordered   Complete by: As directed ?  ? No wound care   Complete by: As directed ?  ? ?  ? ? ?Discharge Medications  ? ?Allergies as of 03/07/2022   ?No Known Allergies ?  ? ?  ?Medication List  ?  ? ?STOP taking these medications   ? ?predniSONE 10 MG tablet ?Commonly known as: DELTASONE ?  ? ?  ? ?TAKE these medications   ? ?acetaminophen 500 MG tablet ?Commonly known as: TYLENOL ?Take 1,000 mg by mouth every 6 (six) hours as needed for mild pain. ?  ?acyclovir 50 MG/ML injection ?Commonly known as: ZOVIRAX ?Inject 20 mLs (1,000 mg total) into the vein every 8 (eight) hours for 10 days. Indication:  VZV encephalitis  ?First Dose: Yes ?Last Day of Therapy:  03/16/22 ?Labs - Sunday/Monday:  CBC/D, BMP ?Labs - Thursday:  BMP ?Labs - Every other week:  ESR and CRP ?Method of administration:Elastomeric ?Method of  administration may be changed at the discretion of the patient and/or caregiver's ability to self-administer the medication ordered. ?  ?amLODipine 5 MG tablet ?Commonly known as: NORVASC ?Take 1 tablet (5 mg total

## 2022-03-07 NOTE — TOC Transition Note (Signed)
Transition of Care (TOC) - CM/SW Discharge Note ? ? ?Patient Details  ?Name: ABDULLA POOLEY ?MRN: [id number] ?Date of Birth: [birth year] ? ?Transition of Care (TOC) CM/SW Contact:  ?Harriet Masson, RN ?Phone Number: ?03/07/2022, 1:22 PM ? ? ?Clinical Narrative:    ?Patient stable for discharge. Pam with [facility] notified of discharge. Wife at bedside to transport home.  ? ? ? ?Final next level of care: Home w Home Health Services ?Barriers to Discharge: Barriers Resolved ? ? ?Patient Goals and CMS Choice ?Patient states their goals for this hospitalization and ongoing recovery are:: to go home ?  ?  ? ?Discharge Placement ?  ?           ?  ?  ?  ? home ? ?Discharge Plan and Services ?  ?Discharge Planning Services: CM Consult ?           ?  ?  ?  ?  ?  ?HH Arranged: RN ?  ?  ?  ?  ? ?Social Determinants of Health (SDOH) Interventions ?  ? ? ?Readmission Risk Interventions ? ?  03/07/2022  ?  1:21 PM  ?Readmission Risk Prevention Plan  ?Post Dischage Appt Complete  ?Medication Screening Complete  ?Transportation Screening Complete  ? ? ? ? ? ?

## 2022-03-07 NOTE — TOC Transition Note (Signed)
Transition of Care (TOC) - CM/SW Discharge Note ? ? ?Patient Details  ?Name: Darrell King ?MRN: [id number] ?Date of Birth: 1966/03/06 ? ?Transition of Care (TOC) CM/SW Contact:  ?Harriet Masson, RN ?Phone Number: ?03/07/2022, 4:23 PM ? ? ?Clinical Narrative:    ?Patient stable for discharge. Home health ordered. Patient active with [facility]. Spoke to [place] and notified her of resumption orders for PT/OT/RN/SW. ? ? ? ?Final next level of care: Home w Home Health Services ?Barriers to Discharge: Barriers Resolved ? ? ?Patient Goals and CMS Choice ?Patient states their goals for this hospitalization and ongoing recovery are:: to go home ?  ?  ? ?Discharge Placement ?  ?           ? home ?  ?  ?  ? ?Discharge Plan and Services ?  ?Discharge Planning Services: CM Consult ?Post Acute Care Choice: Home Health          ?  ?  ?  ?  ?  ?HH Arranged: RN, PT, OT, Social Work ?HH Agency: [facility] ?Date HH Agency Contacted: 03/07/22 ?Time HH Agency Contacted: 1622 ?Representative spoke with at [facility] Agency: Elnita Maxwell ? ?Social Determinants of Health (SDOH) Interventions ?  ? ? ?Readmission Risk Interventions ? ?  03/07/2022  ?  1:21 PM  ?Readmission Risk Prevention Plan  ?Post Dischage Appt Complete  ?Medication Screening Complete  ?Transportation Screening Complete  ? ? ? ? ? ?

## 2022-03-07 NOTE — Plan of Care (Signed)
?  ?                                    [facility] ?                           [address] [place], [place] [address] ?  ? ? ? ?Darrell King was admitted to the Hospital on 03/02/2022 and Discharged  03/07/2022 and should be excused from work/school  ? ?for 14  days starting from date -  03/02/2022 , may return to work/school without any restrictions. ? ?Call Susa Raring MD, [facility]  [phone] with questions. ? ?Susa Raring M.D on 03/07/2022,at 9:55 AM ? ?[facility]   ?Office  [phone] ? ?

## 2022-03-07 NOTE — Discharge Instructions (Signed)
Do not drive, operate heavy machinery, perform activities at heights, swimming or participation in water activities or provide baby sitting services until you have seen by Primary MD or a Neurologist and advised to do so again. ? ?Follow with Primary MD [place], Raymond J, MD in 7 days  ? ?Get CBC, CMP, 2 view Chest X ray -  checked next visit within 1 week by Primary MD   ? ?Activity: As tolerated with Full fall precautions use walker/cane & assistance as needed ? ?Disposition Home  ? ?Diet: Heart Healthy   ? ?Special Instructions: If you have smoked or chewed Tobacco  in the last 2 yrs please stop smoking, stop any regular Alcohol  and or any Recreational drug use. ? ?On your next visit with your primary care physician please Get Medicines reviewed and adjusted. ? ?Please request your Prim.MD to go over all Hospital Tests and Procedure/Radiological results at the follow up, please get all Hospital records sent to your Prim MD by signing hospital release before you go home. ? ?If you experience worsening of your admission symptoms, develop shortness of breath, life threatening emergency, suicidal or homicidal thoughts you must seek medical attention immediately by calling 911 or calling your MD immediately  if symptoms less severe. ? ?You Must read complete instructions/literature along with all the possible adverse reactions/side effects for all the Medicines you take and that have been prescribed to you. Take any new Medicines after you have completely understood and accpet all the possible adverse reactions/side effects.  ? ? ? ?  ? ?

## 2022-03-07 NOTE — Plan of Care (Signed)
?  Problem: Education: ?Goal: Knowledge of General Education information will improve ?Description: Including pain rating scale, medication(s)/side effects and non-pharmacologic comfort measures ?Outcome: Adequate for Discharge ?  ?Problem: Health Behavior/Discharge Planning: ?Goal: Ability to manage health-related needs will improve ?Outcome: Adequate for Discharge ?  ?Problem: Clinical Measurements: ?Goal: Ability to maintain clinical measurements within normal limits will improve ?Outcome: Adequate for Discharge ?Goal: Will remain free from infection ?Outcome: Adequate for Discharge ?Goal: Diagnostic test results will improve ?Outcome: Adequate for Discharge ?Goal: Respiratory complications will improve ?Outcome: Adequate for Discharge ?Goal: Cardiovascular complication will be avoided ?Outcome: Adequate for Discharge ?  ?Problem: Coping: ?Goal: Level of anxiety will decrease ?Outcome: Adequate for Discharge ?  ?Problem: Nutrition: ?Goal: Adequate nutrition will be maintained ?Outcome: Adequate for Discharge ?  ?Problem: Elimination: ?Goal: Will not experience complications related to bowel motility ?Outcome: Adequate for Discharge ?Goal: Will not experience complications related to urinary retention ?Outcome: Adequate for Discharge ?  ?Problem: Skin Integrity: ?Goal: Risk for impaired skin integrity will decrease ?Outcome: Adequate for Discharge ?  ?Problem: Safety: ?Goal: Ability to remain free from injury will improve ?Outcome: Adequate for Discharge ?  ?

## 2022-03-08 DIAGNOSIS — G0490 Encephalitis and encephalomyelitis, unspecified: Secondary | ICD-10-CM | POA: Diagnosis not present

## 2022-03-08 DIAGNOSIS — B0111 Varicella encephalitis and encephalomyelitis: Secondary | ICD-10-CM | POA: Diagnosis not present

## 2022-03-08 DIAGNOSIS — A86 Unspecified viral encephalitis: Secondary | ICD-10-CM | POA: Diagnosis not present

## 2022-03-09 DIAGNOSIS — A86 Unspecified viral encephalitis: Secondary | ICD-10-CM | POA: Diagnosis not present

## 2022-03-09 DIAGNOSIS — B0111 Varicella encephalitis and encephalomyelitis: Secondary | ICD-10-CM | POA: Diagnosis not present

## 2022-03-09 DIAGNOSIS — G0490 Encephalitis and encephalomyelitis, unspecified: Secondary | ICD-10-CM | POA: Diagnosis not present

## 2022-03-10 DIAGNOSIS — G0490 Encephalitis and encephalomyelitis, unspecified: Secondary | ICD-10-CM | POA: Diagnosis not present

## 2022-03-10 DIAGNOSIS — A86 Unspecified viral encephalitis: Secondary | ICD-10-CM | POA: Diagnosis not present

## 2022-03-10 DIAGNOSIS — B0111 Varicella encephalitis and encephalomyelitis: Secondary | ICD-10-CM | POA: Diagnosis not present

## 2022-03-11 DIAGNOSIS — A86 Unspecified viral encephalitis: Secondary | ICD-10-CM | POA: Diagnosis not present

## 2022-03-11 DIAGNOSIS — B0111 Varicella encephalitis and encephalomyelitis: Secondary | ICD-10-CM | POA: Diagnosis not present

## 2022-03-11 DIAGNOSIS — G0490 Encephalitis and encephalomyelitis, unspecified: Secondary | ICD-10-CM | POA: Diagnosis not present

## 2022-03-12 ENCOUNTER — Telehealth: Payer: Self-pay

## 2022-03-12 DIAGNOSIS — G0490 Encephalitis and encephalomyelitis, unspecified: Secondary | ICD-10-CM | POA: Diagnosis not present

## 2022-03-12 DIAGNOSIS — B0111 Varicella encephalitis and encephalomyelitis: Secondary | ICD-10-CM | POA: Diagnosis not present

## 2022-03-12 DIAGNOSIS — A86 Unspecified viral encephalitis: Secondary | ICD-10-CM | POA: Diagnosis not present

## 2022-03-12 NOTE — Telephone Encounter (Signed)
[facility] requesting pull PICC orders. End date is 5/19, patient is not scheduled to follow up with Dr. [place] until 5/22. Will route to provider.  ? ?Beryle Flock, RN ? ?

## 2022-03-12 NOTE — Telephone Encounter (Signed)
Notified Jeri Modena with [facility] that okay to pull PICC after last dose on 03/16/22 per Dr. Elinor Parkinson.  ? ?Sandie Ano, RN ? ?

## 2022-03-13 DIAGNOSIS — A86 Unspecified viral encephalitis: Secondary | ICD-10-CM | POA: Diagnosis not present

## 2022-03-13 DIAGNOSIS — G0490 Encephalitis and encephalomyelitis, unspecified: Secondary | ICD-10-CM | POA: Diagnosis not present

## 2022-03-13 DIAGNOSIS — B0111 Varicella encephalitis and encephalomyelitis: Secondary | ICD-10-CM | POA: Diagnosis not present

## 2022-03-14 DIAGNOSIS — A86 Unspecified viral encephalitis: Secondary | ICD-10-CM | POA: Diagnosis not present

## 2022-03-14 DIAGNOSIS — G0490 Encephalitis and encephalomyelitis, unspecified: Secondary | ICD-10-CM | POA: Diagnosis not present

## 2022-03-14 DIAGNOSIS — B0111 Varicella encephalitis and encephalomyelitis: Secondary | ICD-10-CM | POA: Diagnosis not present

## 2022-03-15 ENCOUNTER — Ambulatory Visit (HOSPITAL_BASED_OUTPATIENT_CLINIC_OR_DEPARTMENT_OTHER): Payer: BC Managed Care – PPO | Admitting: Family Medicine

## 2022-03-15 ENCOUNTER — Encounter (HOSPITAL_BASED_OUTPATIENT_CLINIC_OR_DEPARTMENT_OTHER): Payer: Self-pay | Admitting: Family Medicine

## 2022-03-15 VITALS — BP 135/99 | HR 109 | Ht 79.0 in | Wt 312.6 lb

## 2022-03-15 DIAGNOSIS — G0490 Encephalitis and encephalomyelitis, unspecified: Secondary | ICD-10-CM | POA: Diagnosis not present

## 2022-03-15 DIAGNOSIS — R39198 Other difficulties with micturition: Secondary | ICD-10-CM

## 2022-03-15 DIAGNOSIS — B0111 Varicella encephalitis and encephalomyelitis: Secondary | ICD-10-CM | POA: Diagnosis not present

## 2022-03-15 DIAGNOSIS — B029 Zoster without complications: Secondary | ICD-10-CM

## 2022-03-15 DIAGNOSIS — R7989 Other specified abnormal findings of blood chemistry: Secondary | ICD-10-CM

## 2022-03-15 DIAGNOSIS — D649 Anemia, unspecified: Secondary | ICD-10-CM | POA: Diagnosis not present

## 2022-03-15 DIAGNOSIS — A86 Unspecified viral encephalitis: Secondary | ICD-10-CM | POA: Diagnosis not present

## 2022-03-15 HISTORY — DX: Anemia, unspecified: D64.9

## 2022-03-15 MED ORDER — GABAPENTIN 300 MG PO CAPS: 300.0000 mg | ORAL_CAPSULE | Freq: Three times a day (TID) | ORAL | 0 refills | Status: DC

## 2022-03-15 NOTE — Assessment & Plan Note (Signed)
Patient Darrell King that for about a year, he has been having trouble with urinating.  Reports that it primarily is related to initiating urination.  Denies any pain or burning with urination.  Has not necessarily noticed changes to urinary flow.  Denies any problems related to urgency or incontinence. We will refer to urology for further evaluation and recommendations

## 2022-03-15 NOTE — Assessment & Plan Note (Signed)
Anemia noted during recent hospitalization.  Patient has been having labs checked by home health nurse, will defer on checking labs in office today

## 2022-03-15 NOTE — Progress Notes (Signed)
    Procedures performed today:    None.  Independent interpretation of notes and tests performed by another provider:   None.  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  Patient was seen in the office last week presenting for recently diagnosed shingles, abdominal pain.  While getting labs, patient experienced seizure and was transported to emergency department.  CSF was positive for VZV PCR.  Patient was managed on IV acyclovir which he has continued at discharge via PICC line.  Generally he has been doing well since discharge.  He does have follow-up scheduled with infectious disease and neurology.  Due to patient having seizure activity, he has been managed on Keppra and is restricted from driving.  Discharge note recommended checking CBC and BMP as well as chest x-ray.  Patient reports that he has had labs drawn by home health nurse.  Chest x-ray completed in the ER was unremarkable and patient does not have any cardiopulmonary symptoms.  Due to this, will hold off on the studies for now Recommend continued follow-up with infectious disease and neurology.  In regards to any specific work restrictions particularly related to use of heavy machinery, will defer to neurology given seizure experienced  Elevated serum creatinine Noted on labs during admission, did improve during course of admission.  Recommendation was for repeat BMP, however patient has been having labs checked with home health nurse, will hold off on checking today  Anemia Anemia noted during recent hospitalization.  Patient has been having labs checked by home health nurse, will defer on checking labs in office today  Slow urinary stream Patient that for about a year, he has been having trouble with  urinating.  Reports that it primarily is related to initiating urination.  Denies any pain or burning with urination.  Has not necessarily noticed changes to urinary flow.  Denies any problems related to urgency or incontinence. We will refer to urology for further evaluation and recommendations  Spent 30 minutes on this patient encounter, including preparation, chart review, face-to-face counseling with patient and coordination of care, and documentation of encounter  Plan for follow-up in about 6 weeks or sooner as needed.  Primarily will be following up on hypertension, progress related to shingles   ___________________________________________ Kariel Skillman de Isabella Stalling, MD, ABFM, [facility]

## 2022-03-15 NOTE — Assessment & Plan Note (Signed)
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  Patient was seen in the office last week presenting for recently diagnosed shingles, abdominal pain.  While getting labs, patient experienced seizure and was transported to emergency department.  CSF was positive for VZV PCR.  Patient was managed on IV acyclovir which he has continued at discharge via PICC line.  Generally he has been doing well since discharge.  He does have follow-up scheduled with infectious disease and neurology.  Due to patient having seizure activity, he has been managed on Keppra and is restricted from driving.  Discharge note recommended checking CBC and BMP as well as chest x-ray.  Patient reports that he has had labs drawn by home health nurse.  Chest x-ray completed in the ER was unremarkable and patient does not have any cardiopulmonary symptoms.  Due to this, will hold off on the studies for now Recommend continued follow-up with infectious disease and neurology.  In regards to any specific work restrictions particularly related to use of heavy machinery, will defer to neurology given seizure experienced

## 2022-03-15 NOTE — Assessment & Plan Note (Signed)
Noted on labs during admission, did improve during course of admission.  Recommendation was for repeat BMP, however patient has been having labs checked with home health nurse, will hold off on checking today

## 2022-03-16 DIAGNOSIS — A86 Unspecified viral encephalitis: Secondary | ICD-10-CM | POA: Diagnosis not present

## 2022-03-16 DIAGNOSIS — B0111 Varicella encephalitis and encephalomyelitis: Secondary | ICD-10-CM | POA: Diagnosis not present

## 2022-03-16 DIAGNOSIS — G0490 Encephalitis and encephalomyelitis, unspecified: Secondary | ICD-10-CM | POA: Diagnosis not present

## 2022-03-17 DIAGNOSIS — G0490 Encephalitis and encephalomyelitis, unspecified: Secondary | ICD-10-CM | POA: Diagnosis not present

## 2022-03-17 DIAGNOSIS — A86 Unspecified viral encephalitis: Secondary | ICD-10-CM | POA: Diagnosis not present

## 2022-03-17 DIAGNOSIS — B0111 Varicella encephalitis and encephalomyelitis: Secondary | ICD-10-CM | POA: Diagnosis not present

## 2022-03-19 ENCOUNTER — Ambulatory Visit: Payer: BC Managed Care – PPO | Admitting: Neurology

## 2022-03-19 ENCOUNTER — Ambulatory Visit (HOSPITAL_BASED_OUTPATIENT_CLINIC_OR_DEPARTMENT_OTHER): Payer: BC Managed Care – PPO | Admitting: Family Medicine

## 2022-03-19 ENCOUNTER — Encounter: Payer: Self-pay | Admitting: Infectious Diseases

## 2022-03-19 ENCOUNTER — Encounter: Payer: Self-pay | Admitting: Neurology

## 2022-03-19 ENCOUNTER — Other Ambulatory Visit: Payer: Self-pay

## 2022-03-19 ENCOUNTER — Ambulatory Visit (INDEPENDENT_AMBULATORY_CARE_PROVIDER_SITE_OTHER): Payer: BC Managed Care – PPO | Admitting: Infectious Diseases

## 2022-03-19 VITALS — BP 116/84 | HR 118 | Ht 79.0 in | Wt 312.5 lb

## 2022-03-19 VITALS — BP 117/88 | HR 109 | Temp 97.6°F | Wt 312.0 lb

## 2022-03-19 DIAGNOSIS — A86 Unspecified viral encephalitis: Secondary | ICD-10-CM | POA: Diagnosis not present

## 2022-03-19 DIAGNOSIS — B0229 Other postherpetic nervous system involvement: Secondary | ICD-10-CM | POA: Diagnosis not present

## 2022-03-19 DIAGNOSIS — B020 Zoster encephalitis: Secondary | ICD-10-CM

## 2022-03-19 DIAGNOSIS — B0111 Varicella encephalitis and encephalomyelitis: Secondary | ICD-10-CM | POA: Diagnosis not present

## 2022-03-19 DIAGNOSIS — Z452 Encounter for adjustment and management of vascular access device: Secondary | ICD-10-CM | POA: Diagnosis not present

## 2022-03-19 DIAGNOSIS — Z5181 Encounter for therapeutic drug level monitoring: Secondary | ICD-10-CM | POA: Diagnosis not present

## 2022-03-19 DIAGNOSIS — B028 Zoster with other complications: Secondary | ICD-10-CM

## 2022-03-19 MED ORDER — LIDOCAINE 3 % EX CREA: 1.0000 | TOPICAL_CREAM | CUTANEOUS | 0 refills | Status: AC | PRN

## 2022-03-19 MED ORDER — BUTALBITAL-APAP-CAFFEINE 50-325-40 MG PO TABS: 1.0000 | ORAL_TABLET | Freq: Four times a day (QID) | ORAL | 0 refills | Status: DC | PRN

## 2022-03-19 MED ORDER — LEVETIRACETAM 500 MG PO TABS: 500.0000 mg | ORAL_TABLET | Freq: Two times a day (BID) | ORAL | 6 refills | Status: DC

## 2022-03-19 NOTE — Patient Instructions (Signed)
Increase gabapentin to 600 mg 3 times daily Continue with Fioricet as needed for headaches Continue with hydration and rest Follow-up with your primary care doctor Follow-up in 75-months or sooner if worse.

## 2022-03-19 NOTE — Progress Notes (Signed)
[facility]  PATIENT: Darrell King DOB: 05-Oct-1966  REQUESTING CLINICIAN: Jefferson Fuel, MD HISTORY FROM: Patient and spouse  REASON FOR VISIT: Seizure in the setting of viral encephalitis    HISTORICAL  CHIEF COMPLAINT:  Chief Complaint  Patient presents with   New Patient (Initial Visit)    Rm 13. Accompanied by wife, French Ana. NP ED referral for new onset seizure, viral encephalitis. C/o headache, requesting refills of Fioricet.    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  Patient reports presenting to his primary care doctor on May 5, for shingle, at that time they were drawing blood and patient starts complaining of not feeling well, he was on the wheelchair, wife noted that patient eyes rolled back with a blank stare and he went limp.  There was report patient being confused after the events.  Because of the seizure-like activity he was referred to [facility] health for admission.  His MRI did not show any acute abnormalities EEG was normal, his LP was positive for VZV.  He was started on Keppra and antiviral.  Since discharge from the hospital he is complaining of headache and also post herpetic neuralgia.  He denies any additional seizures and denies any seizure risk factors.  He is compliant with the Keppra medication and denies any side effects.      Hospital summary  Patient is a 56 y.o.  male with history of HTN, HLD, aseptic meningitis in 2014-who developed shingles in his right flank area of his torso-started on Valtrex 3 days prior to this hospitalization-subsequently developed fever 1 day prior to this hospitalization-he was at his PCPs office when he was noted to have a generalized tonic-clonic seizure.  He was subsequently  sent to the emergency room-and admitted to [facility] service.  Varicella-zoster encephalitis/meningitis: Headache, neck stiffness and seizures, also had active shingles in his right T7 dermatome.  Rash has dried up has been placed on IV acyclovir started on 03/02/2022 of [redacted] weeks along with with gentle IV fluids, stable EEG and MRI.  CSF serology positive for VZV, PICC line was placed on 03/06/2022 he was acyclovir stop date is 03/16/2022, he was seen by neurology along with ID and IR.  Headaches much better flank discomfort much improved, no further seizure activity.  Currently on Keppra as well.  Will follow outpatient with neurology and ID postdischarge along with PCP.   Right T7 dermatome shingles: On IV acyclovir-on gabapentin for neuropathic pain.  Overall improving.    Handedness: Right handed   Onset: 03/02/22  Seizure Type: Unclear, possibly generalized  Current frequency: Only once  Any injuries from seizures: Denies  Seizure risk factors: Viral encephalitis  Previous ASMs: None  Currenty ASMs: Levetiracetam  ASMs side effects: Denies  Brain Images: Normal MRI brain  Previous EEGs: Normal EEG   OTHER MEDICAL CONDITIONS: Hypertension, Hyperlipidemia, Gout   REVIEW OF SYSTEMS: Full 14 system review of systems performed and negative with exception of: As noted in the HPI  ALLERGIES: No Known Allergies  HOME MEDICATIONS: Outpatient Medications Prior to Visit  Medication Sig Dispense Refill   acetaminophen (TYLENOL) 500 MG tablet Take 1,000 mg by mouth every 6 (six) hours as needed for mild pain.     amLODipine (NORVASC) 5 MG tablet Take 1 tablet (5 mg total)  by mouth daily. 90 tablet 1   cyclobenzaprine (FLEXERIL) 10 MG tablet Take 1 tablet (10 mg total) by mouth 3 (three) times daily as needed for muscle spasms. 90 tablet 1   diclofenac Sodium (VOLTAREN) 1 % GEL Apply 4 g topically 4 (four) times daily. 350 g 1   gabapentin (NEURONTIN) 300 MG capsule Take 1 capsule (300 mg  total) by mouth 3 (three) times daily. 60 capsule 0   rosuvastatin (CRESTOR) 5 MG tablet Take 1 tablet (5 mg total) by mouth daily. 90 tablet 1   valACYclovir (VALTREX) 1000 MG tablet Take 1 tablet (1,000 mg total) by mouth 3 (three) times daily for 7 days. 21 tablet 0   butalbital-acetaminophen-caffeine (FIORICET) 50-325-40 MG tablet Take 1 tablet by mouth every 12 (twelve) hours as needed for headache. 10 tablet 0   levETIRAcetam (KEPPRA) 500 MG tablet Take 1 tablet (500 mg total) by mouth 2 (two) times daily. 60 tablet 0   omeprazole (PRILOSEC) 40 MG capsule Take 1 capsule (40 mg total) by mouth daily. (Patient not taking: Reported on 03/19/2022) 30 capsule 0   No facility-administered medications prior to visit.    PAST MEDICAL HISTORY: Past Medical History:  Diagnosis Date   Acute gout 12/31/2016   Anemia 03/15/2022   Anxiety    "had taken Paxil; back in 1996; nothing since" (10/21/2013)   Arthritis    Complication of anesthesia    SLOW TO WAKE UP AFTER SURGERY IN 2019   Dyslipidemia 03/11/2016   12.5% 10-year risk of heart disease or stroke, should be on a moderate/high intensity statin.     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   Refusal of blood transfusions as patient is Jehovah's Witness     PAST SURGICAL HISTORY: Past Surgical History:  Procedure Laterality Date   COLONOSCOPY W/ POLYPECTOMY     INGUINAL HERNIA REPAIR Right 1995   INGUINAL HERNIA REPAIR Left 11/12/2013   Procedure: HERNIA REPAIR INGUINAL INCARCERATED;  Surgeon: Adolph Pollack, MD;  Location: [facility] OR;  Service: General;  Laterality: Left;   SHOULDER ARTHROSCOPY WITH OPEN ROTATOR CUFF REPAIR AND DISTAL CLAVICLE ACROMINECTOMY Right 06/02/2019   Procedure: right shoulder arthroscopy , mini open rotator cuff repair;  Surgeon: Cammy Copa, MD;  Location: [facility] OR;  Service: Orthopedics;   Laterality: Right;   SHOULDER ARTHROSCOPY WITH SUBACROMIAL DECOMPRESSION, ROTATOR CUFF REPAIR AND BICEP TENDON REPAIR Right 07/14/2018   Procedure: RIGHT SHOULDER ARTHROSCOPY, DEBRIDEMENT, BICEPS TENODESIS, DISTAL CLAVICLE EXCISION AND MINI OPEN ROTATOR CUFF TEAR REPAIR;  Surgeon: Cammy Copa, MD;  Location: [facility] OR;  Service: Orthopedics;  Laterality: Right;    FAMILY HISTORY: Family History  Problem Relation Age of Onset   Colon cancer Mother        colon   Cancer Mother    Cancer Sister        dont know what type    SOCIAL HISTORY: Social History   Socioeconomic History   Marital status: Married    Spouse name: Not on file   Number of children: 3   Years of education: Not on file   Highest education level: Not on file  Occupational History   Occupation: Maintenance Tech  Tobacco Use   Smoking status: Never   Smokeless tobacco: Never  Vaping Use   Vaping Use: Never used  Substance and Sexual Activity   Alcohol use: Yes  Alcohol/week: 1.0 - 2.0 standard drink    Types: 1 - 2 Shots of liquor per week    Comment: occasionally.   Drug use: No   Sexual activity: Yes    Birth control/protection: None  Other Topics Concern   Not on file  Social History Narrative   Enjoys bowling   Works as a Cabin crew Strain: Not on Ship broker Insecurity: Not on file  Transportation Needs: Not on file  Physical Activity: Not on file  Stress: Not on file  Social Connections: Not on file  Intimate Partner Violence: Not on file    PHYSICAL EXAM  GENERAL EXAM/CONSTITUTIONAL: Vitals:  Vitals:   03/19/22 1445  BP: 116/84  Pulse: (!) 118  Weight: (!) 312 lb 8 oz (141.7 kg)  Height:  (2.007 m)   Body mass index is 35.2 kg/m. Wt Readings from Last 3 Encounters:  03/19/22 (!) 312 lb 8 oz (141.7 kg)  03/19/22 (!) 312 lb (141.5 kg)  03/15/22 (!) 312 lb 9.6 oz (141.8 kg)   Patient is in no distress; well  developed, nourished and groomed; neck is supple  EYES: Pupils round and reactive to light, Visual fields full to confrontation, Extraocular movements intacts,  No results found.  MUSCULOSKELETAL: Gait, strength, tone, movements noted in Neurologic exam below  NEUROLOGIC: MENTAL STATUS:      View : No data to display.         awake, alert, oriented to person, place and time recent and remote memory intact normal attention and concentration language fluent, comprehension intact, naming intact fund of knowledge appropriate  CRANIAL NERVE:  2nd, 3rd, 4th, 6th - pupils equal and reactive to light, visual fields full to confrontation, extraocular muscles intact, no nystagmus 5th - facial sensation symmetric 7th - facial strength symmetric 8th - hearing intact 9th - palate elevates symmetrically, uvula midline 11th - shoulder shrug symmetric 12th - tongue protrusion midline  MOTOR:  normal bulk and tone, full strength in the BUE, BLE  SENSORY:  Large amount of healing vesicles on the right side of back and also right flank  COORDINATION:  finger-nose-finger, fine finger movements normal  REFLEXES:  deep tendon reflexes present and symmetric  GAIT/STATION:  normal   DIAGNOSTIC DATA (LABS, IMAGING, TESTING) - I reviewed patient records, labs, notes, testing and imaging myself where available.  Lab Results  Component Value Date   WBC 4.8 03/07/2022   HGB 12.4 (L) 03/07/2022   HCT 37.8 (L) 03/07/2022   MCV 86.5 03/07/2022   PLT 226 03/07/2022      Component Value Date/Time   NA 141 03/07/2022 0434   NA 137 03/02/2022 0924   K 3.5 03/07/2022 0434   CL 108 03/07/2022 0434   CO2 28 03/07/2022 0434   GLUCOSE 101 (H) 03/07/2022 0434   BUN 10 03/07/2022 0434   BUN 10 03/02/2022 0924   CREATININE 1.18 03/07/2022 0434   CREATININE 1.10 10/30/2013 1138   CALCIUM 8.8 (L) 03/07/2022 0434   PROT 6.7 03/07/2022 0434   PROT 7.0 03/02/2022 0924   ALBUMIN 3.1 (L)  03/07/2022 0434   ALBUMIN 4.0 03/02/2022 0924   AST 24 03/07/2022 0434   ALT 30 03/07/2022 0434   ALKPHOS 58 03/07/2022 0434   BILITOT 0.5 03/07/2022 0434   BILITOT 1.7 (H) 03/02/2022 0924   GFRNONAA >60 03/07/2022 0434   GFRNONAA 80 10/30/2013 1138   GFRAA >60 06/02/2019 1145  GFRAA >89 10/30/2013 1138   Lab Results  Component Value Date   CHOL 222 (H) 02/05/2022   HDL 31 (L) 02/05/2022   LDLCALC 127 (H) 02/05/2022   TRIG 361 (H) 02/05/2022   Lab Results  Component Value Date   HGBA1C 5.5 02/05/2022   Lab Results  Component Value Date   VITAMINB12 367 12/31/2018   Lab Results  Component Value Date   TSH 2.010 02/05/2022    MRI Brain 03/04/22 1. No acute intracranial abnormality. 2. Focus of magnetic susceptibility effect at the paramedian undersurface of the right tentorial leaflet, likely an area of remote hemorrhage.   EEG 03/04/22 This study is within normal limits. The excessive beta activity seen in the background is most likely due to the effect of benzodiazepine and is a benign EEG pattern. No seizures or epileptiform discharges were seen throughout the recording.   ASSESSMENT AND PLAN  56 y.o. year old male  with hypertension, hyperlipidemia, obesity, and gout who is presenting after recent discharge from the hospital for viral encephalitis and herpes Zoster re-activation.  Now patient is complaining of severe postherpetic neuralgia and headache.  He is on gabapentin 300 mg twice daily but the pain is not controlled.  I recommend patient to increase the gabapentin to 600 mg 3 times daily and to use the Fioricet as needed for headaches.  Also encourage lots of fluid. In terms of his viral encephalitis, he completed 2 weeks of IV antiviral and now is back to p.o. antiviral.  He still experiencing headaches.  Denies any seizure-like activity.  For his seizures he is on Keppra 500 mg twice daily, I recommend patient to continue Keppra for at least 6 months.  Also  discussed driving restriction for the next 6 months.  He does drive for his job, I provided him a letter stating that he cannot drive for the next 6 months and if possible to find modified work duties for the next 6 months. Also due to his ongoing herpetic pain I will take him off work until pain better controlled.    1. Viral encephalitis   2. Zoster encephalitis   3. Post herpetic neuralgia   4. Herpes zoster with other complication     Patient Instructions  Increase gabapentin to 600 mg 3 times daily Continue with Fioricet as needed for headaches Continue with hydration and rest Follow-up with your primary care doctor Follow-up in 406-months or sooner if worse.   [place], patients with seizures are not allowed to drive until they have been seizure-free for six months.  Other recommendations include using caution when using heavy equipment or power tools. Avoid working on ladders or at heights. Take showers instead of baths.  Do not swim alone.  Ensure the water temperature is not too high on the home water heater. Do not go swimming alone. Do not lock yourself in a room alone (i.e. bathroom). When caring for infants or small children, sit down when holding, feeding, or changing them to minimize risk of injury to the child in the event you have a seizure. Maintain good sleep hygiene. Avoid alcohol.  Also recommend adequate sleep, hydration, good diet and minimize stress.   During the Seizure  - First, ensure adequate ventilation and place patients on the floor on their left side  Loosen clothing around the neck and ensure the airway is patent. If the patient is clenching the teeth, do not force the mouth open with any object as this  can cause severe damage - Remove all items from the surrounding that can be hazardous. The patient may be oblivious to what's happening and may not even know what he or she is doing. If the patient is confused and wandering, either gently  guide him/her away and block access to outside areas - Reassure the individual and be comforting - Call 911. In most cases, the seizure ends before EMS arrives. However, there are cases when seizures may last over 3 to 5 minutes. Or the individual may have developed breathing difficulties or severe injuries. If a pregnant patient or a person with diabetes develops a seizure, it is prudent to call an ambulance. - Finally, if the patient does not regain full consciousness, then call EMS. Most patients will remain confused for about 45 to 90 minutes after a seizure, so you must use judgment in calling for help. - Avoid restraints but make sure the patient is in a bed with padded side rails - Place the individual in a lateral position with the neck slightly flexed; this will help the saliva drain from the mouth and prevent the tongue from falling backward - Remove all nearby furniture and other hazards from the area - Provide verbal assurance as the individual is regaining consciousness - Provide the patient with privacy if possible - Call for help and start treatment as ordered by the caregiver   After the Seizure (Postictal Stage)  After a seizure, most patients experience confusion, fatigue, muscle pain and/or a headache. Thus, one should permit the individual to sleep. For the next few days, reassurance is essential. Being calm and helping reorient the person is also of importance.  Most seizures are painless and end spontaneously. Seizures are not harmful to others but can lead to complications such as stress on the lungs, brain and the heart. Individuals with prior lung problems may develop labored breathing and respiratory distress.     No orders of the defined types were placed in this encounter.   Meds ordered this encounter  Medications   butalbital-acetaminophen-caffeine (FIORICET) 50-325-40 MG tablet    Sig: Take 1 tablet by mouth every 6 (six) hours as needed for headache.     Dispense:  20 tablet    Refill:  0   levETIRAcetam (KEPPRA) 500 MG tablet    Sig: Take 1 tablet (500 mg total) by mouth 2 (two) times daily.    Dispense:  60 tablet    Refill:  6   Lidocaine 3 % CREA    Sig: Apply 1 Bottle topically as needed for up to 15 days (For pain and itchiness).    Dispense:  28 g    Refill:  0    Return in about 6 months (around 09/19/2022).  I have spent a total of 65 minutes dedicated to this patient today, preparing to see patient, performing a medically appropriate examination and evaluation, ordering tests and/or medications and procedures, and counseling and educating the patient/family/caregiver; independently interpreting result and communicating results to the family/patient/caregiver; and documenting clinical information in the electronic medical record.   Windell Norfolk, MD 03/19/2022, 5:34 PM  [facility] [address] [place], [place] [address] [phone]

## 2022-03-19 NOTE — Progress Notes (Incomplete)
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   Encephalitis 03/02/2022   Family history of malignant neoplasm of digestive organ 02/05/2022   Wellness examination 02/05/2022   Left knee pain 12/20/2021   Low back pain 10/16/2021   Healthcare maintenance 10/16/2021   Dermatitis 04/19/2021   Obesity (BMI 30-39.9) 02/02/2021   Melanonychia 09/22/2019   Arthritis of right acromioclavicular joint    S/P arthroscopy of right shoulder 06/02/2019   Chronic Right Ankle Pain  12/31/2016   Dyslipidemia 03/11/2016   Essential hypertension, benign 02/23/2016   Tendinopathy of right rotator cuff 02/02/2016   Numbness and tingling of right upper extremity 11/29/2014   Patient is Jehovah's Witness 01/04/2014   Left inguinal hernia 11/11/2013   Current Outpatient Medications on File Prior to Visit  Medication Sig Dispense Refill   acetaminophen (TYLENOL) 500 MG tablet Take 1,000 mg by mouth every 6 (six) hours as needed for mild pain.     amLODipine (NORVASC) 5 MG tablet Take 1 tablet (5 mg total) by mouth daily. 90 tablet 1   cyclobenzaprine (FLEXERIL) 10 MG tablet Take 1 tablet (10 mg total) by mouth 3 (three) times daily as needed for muscle spasms. 90 tablet 1   diclofenac Sodium (VOLTAREN) 1 % GEL Apply 4 g topically 4 (four) times daily. 350 g 1   gabapentin (NEURONTIN) 300 MG capsule Take 1 capsule (300 mg total) by mouth 3 (three) times daily. 60 capsule 0   rosuvastatin (CRESTOR) 5 MG tablet Take 1 tablet (5 mg total) by mouth daily. 90 tablet 1   valACYclovir (VALTREX) 1000 MG tablet Take 1 tablet (1,000 mg total) by mouth 3 (three) times daily for 7 days. 21 tablet 0   No current facility-administered medications on file prior to  visit.   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 He has completed IV acyclovir on 5/19 and PICC line has been removed. Rashes in the Rt lower back and flank has faded with only hyperpigmented patch remaining. Complains of sharp and shooting pain noted in the rt lower back and flank. He is on Gabapentin three times a day. Had seen PCP 5/18. Has a fu with Neurology this afternoon and where will discuss about when he can go back to work given he also had seizures. He is cleared to go back to work from [place]. He reported to nursing staff he is also taking valtrex three times daily.   Review of Systems: ROS all systems reviewed with pertinent positives and negatives as listed above  Past Medical History:  Diagnosis Date   Acute gout 12/31/2016   Anemia 03/15/2022   Anxiety    "had taken Paxil; back in 1996; nothing since" (10/21/2013)   Arthritis    Complication of anesthesia    SLOW TO WAKE UP AFTER SURGERY IN 2019   Dyslipidemia 03/11/2016   12.5% 10-year risk of heart disease or stroke, should be on a moderate/high intensity statin.     Encounter for hepatitis C screening test for low risk patient 02/02/2021   Essential hypertension, benign 02/23/2016   New onset seizure ([place]) 03/02/2022   Refusal of blood transfusions as patient is Jehovah's Witness    Past Surgical History:  Procedure Laterality Date   COLONOSCOPY W/ POLYPECTOMY     INGUINAL HERNIA REPAIR Right 1995   INGUINAL HERNIA REPAIR Left 11/12/2013   Procedure: HERNIA REPAIR INGUINAL INCARCERATED;  Surgeon: Odis Hollingshead, MD;  Location: [facility];  Service: General;  Laterality: Left;   SHOULDER ARTHROSCOPY WITH OPEN ROTATOR CUFF REPAIR AND DISTAL CLAVICLE ACROMINECTOMY Right 06/02/2019   Procedure: right shoulder arthroscopy , mini open rotator cuff repair;  Surgeon: Meredith Pel, MD;  Location: [facility];  Service: Orthopedics;  Laterality: Right;   SHOULDER ARTHROSCOPY WITH SUBACROMIAL  DECOMPRESSION, ROTATOR CUFF REPAIR AND BICEP TENDON REPAIR Right 07/14/2018   Procedure: RIGHT SHOULDER ARTHROSCOPY, DEBRIDEMENT, BICEPS TENODESIS, DISTAL CLAVICLE EXCISION AND MINI OPEN ROTATOR CUFF TEAR REPAIR;  Surgeon: Meredith Pel, MD;  Location: [facility];  Service: Orthopedics;  Laterality: Right;     Social History   Tobacco Use   Smoking status: Never   Smokeless tobacco: Never  Vaping Use   Vaping Use: Never used  Substance Use Topics   Alcohol use: Yes    Alcohol/week: 1.0 - 2.0 standard drink    Types: 1 - 2 Shots of liquor per week    Comment: occasionally.   Drug use: No    Family History  Problem Relation Age of Onset   Colon cancer Mother        colon   Cancer Mother    Cancer Sister        dont know what type    No Known Allergies  Health Maintenance  Topic Date Due   COVID-19 Vaccine (1) Never done   Zoster Vaccines- Shingrix (1 of 2) Never done   INFLUENZA VACCINE  05/29/2022   TETANUS/TDAP  02/02/2027   COLONOSCOPY (Pts 45-66yrs Insurance coverage will need to be confirmed)  12/28/2027   Hepatitis C Screening  Completed   HIV Screening  Completed   HPV VACCINES  Aged Out    Objective: BP 117/88   Pulse (!) 109   Temp 97.6 F (36.4 C) (Oral)   Wt (!) 312 lb (141.5 kg)   BMI 35.15 kg/m    Physical Exam Constitutional:      Appearance: Normal appearance. Obese  HENT:     Head: Normocephalic and atraumatic.      Mouth: Mucous membranes are moist.  Eyes:    Conjunctiva/sclera: Conjunctivae normal.     Pupils:  Cardiovascular:     Rate and Rhythm: Normal rate and regular rhythm.     Heart sounds:  Pulmonary:     Effort: Pulmonary effort is normal.     Breath sounds: Normal breath sounds.   Abdominal:     General: Non distended     Palpations: soft.   Musculoskeletal:        General: Normal range of motion.   Skin:    General: Skin is warm and dry.     Comments: Rashes seen in the rt lower back and rt flank are fading  with only hyperpigmented patch. No new rashes   Neurological:     General: grossly non focal     Mental Status: awake, alert and oriented to person, place, and time.   Psychiatric:        Mood and Affect: Mood normal.   Lab Results Lab Results  Component Value Date   WBC 4.8 03/07/2022   HGB 12.4 (L) 03/07/2022   HCT 37.8 (L) 03/07/2022   MCV 86.5 03/07/2022   PLT 226 03/07/2022    Lab  Results  Component Value Date   CREATININE 1.18 03/07/2022   BUN 10 03/07/2022   NA 141 03/07/2022   K 3.5 03/07/2022   CL 108 03/07/2022   CO2 28 03/07/2022    Lab Results  Component Value Date   ALT 30 03/07/2022   AST 24 03/07/2022   ALKPHOS 58 03/07/2022   BILITOT 0.5 03/07/2022    Lab Results  Component Value Date   CHOL 222 (H) 02/05/2022   HDL 31 (L) 02/05/2022   LDLCALC 127 (H) 02/05/2022   TRIG 361 (H) 02/05/2022   CHOLHDL 7.2 (H) 02/05/2022   No results found for: LABRPR, RPRTITER No results found for: HIV1RNAQUANT, HIV1RNAVL, CD4TABS  Problem List Items Addressed This Visit       Nervous and Auditory   Varicella encephalitis   Zoster encephalitis - Primary   Post herpetic neuralgia     Other   Medication monitoring encounter   PICC (peripherally inserted central catheter) in place   Assessment/Plan # VZV Encephalitis w seizures  - completed 14 days course of IV acyclovir.   - on po valacyclovir 1g po tid for 7 days   - Fu with Neurology   - Fu as needed  # Shingles  - rashes has been crusted with no new rashes, cleared from ID standpoint for work.  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  # Post Herpetic Neuralgia - on gabapentin   # PICC  - already removed   # Medication Monitoring 03/12/22 Cr 1.09  I have personally spent 45 minutes involved in face-to-face and non-face-to-face activities for this patient on the day of the visit. Professional time spent includes the following activities:  Preparing to see the patient (review of tests), Obtaining and/or reviewing separately obtained history (admission/discharge record), Performing a medically appropriate examination and/or evaluation , Ordering medications/tests/procedures, referring and communicating with other health care professionals, Documenting clinical information in the EMR, Independently interpreting results (not separately reported), Communicating results to the patient/family/caregiver, Counseling and educating the patient/family/caregiver and Care coordination (not separately reported).   Wilber Oliphant, [facility] 03/19/2022, 9:26 AM

## 2022-03-21 DIAGNOSIS — B020 Zoster encephalitis: Secondary | ICD-10-CM | POA: Insufficient documentation

## 2022-03-21 DIAGNOSIS — B0229 Other postherpetic nervous system involvement: Secondary | ICD-10-CM | POA: Insufficient documentation

## 2022-03-22 ENCOUNTER — Telehealth: Payer: Self-pay

## 2022-03-22 DIAGNOSIS — Z0289 Encounter for other administrative examinations: Secondary | ICD-10-CM

## 2022-03-22 NOTE — Telephone Encounter (Signed)
Form fee paid, dropped off with POD 2

## 2022-03-22 NOTE — Telephone Encounter (Signed)
Received short term disability form from [facility]. LVM for patient to pay towards form fee.

## 2022-03-26 ENCOUNTER — Other Ambulatory Visit (HOSPITAL_BASED_OUTPATIENT_CLINIC_OR_DEPARTMENT_OTHER): Payer: Self-pay | Admitting: Family Medicine

## 2022-03-26 DIAGNOSIS — B029 Zoster without complications: Secondary | ICD-10-CM

## 2022-03-27 ENCOUNTER — Other Ambulatory Visit (HOSPITAL_BASED_OUTPATIENT_CLINIC_OR_DEPARTMENT_OTHER): Payer: Self-pay | Admitting: Neurology

## 2022-03-27 ENCOUNTER — Encounter (HOSPITAL_BASED_OUTPATIENT_CLINIC_OR_DEPARTMENT_OTHER): Payer: Self-pay | Admitting: Family Medicine

## 2022-03-27 DIAGNOSIS — B029 Zoster without complications: Secondary | ICD-10-CM

## 2022-03-27 MED ORDER — GABAPENTIN 300 MG PO CAPS: 300.0000 mg | ORAL_CAPSULE | Freq: Three times a day (TID) | ORAL | 0 refills | Status: DC

## 2022-03-28 ENCOUNTER — Encounter: Payer: Self-pay | Admitting: Neurology

## 2022-03-28 MED ORDER — GABAPENTIN 300 MG PO CAPS: 600.0000 mg | ORAL_CAPSULE | Freq: Three times a day (TID) | ORAL | 1 refills | Status: DC

## 2022-04-19 ENCOUNTER — Encounter: Payer: Self-pay | Admitting: Neurology

## 2022-04-24 ENCOUNTER — Telehealth: Payer: Self-pay | Admitting: *Deleted

## 2022-04-24 NOTE — Telephone Encounter (Signed)
Pt attending phy form faxed on 04/24/2022 [place] [employer] [phone]

## 2022-04-26 ENCOUNTER — Encounter (HOSPITAL_BASED_OUTPATIENT_CLINIC_OR_DEPARTMENT_OTHER): Payer: Self-pay | Admitting: Family Medicine

## 2022-04-26 ENCOUNTER — Ambulatory Visit (INDEPENDENT_AMBULATORY_CARE_PROVIDER_SITE_OTHER): Payer: BC Managed Care – PPO | Admitting: Family Medicine

## 2022-04-26 VITALS — BP 144/93 | HR 103 | Ht 79.0 in | Wt 313.8 lb

## 2022-04-26 DIAGNOSIS — R39198 Other difficulties with micturition: Secondary | ICD-10-CM | POA: Diagnosis not present

## 2022-04-26 DIAGNOSIS — B0229 Other postherpetic nervous system involvement: Secondary | ICD-10-CM | POA: Diagnosis not present

## 2022-04-26 DIAGNOSIS — I10 Essential (primary) hypertension: Secondary | ICD-10-CM

## 2022-04-26 NOTE — Assessment & Plan Note (Signed)
Previously was referred to urology, has not heard from their office.  Generally indicating that this is not as much for concern for him at present.  He also discusses some erectile dysfunction concerns today.  Indicates that this was going on prior to most recent medical issue, hospitalization.  Certainly new medical problems, stress related to these problems and recent hospitalization cannot certainly exacerbate underlying erectile dysfunction.  Discussed options with patient, he plans to monitor at this time.  Recommend evaluation with urology should symptoms persist or become more problematic for patient

## 2022-04-26 NOTE — Assessment & Plan Note (Signed)
Blood pressure borderline in office today, continues with amlodipine, denies any issues with medication at this time.  Not having any current issues related to chest pain or headaches. Recommend intermittent monitoring of blood pressure at home, DASH diet Continue with amlodipine 5 mg daily

## 2022-04-26 NOTE — Assessment & Plan Note (Signed)
Continues to have some neuralgia over area of right trunk where shingles rash was present.  He has been using gabapentin as prescribed by neurology.  Was using some lidocaine in the past, however does not necessarily feel like this provided significant benefit. Did discuss with patient that symptoms can persist for months.  In rare cases it can persist longer than this. Recommend continuing with current medication including gabapentin.  Could consider alternative topical therapies such as capsaicin.  Additional therapies are available as well if symptoms or not adequately controlled He is scheduled for follow-up with neurology in about 5 months.  Advised that if continuing to have notable symptoms, could also request to see them for follow-up sooner

## 2022-04-26 NOTE — Progress Notes (Signed)
    Procedures performed today:    None.  Independent interpretation of notes and tests performed by another provider:   None.  Brief History, Exam, Impression, and Recommendations:    BP (!) 144/93   Pulse (!) 103   Ht 6\' 7"  (2.007 m)   Wt (!) 313 lb 12.8 oz (142.3 kg)   SpO2 97%   BMI 35.35 kg/m   Essential hypertension, benign Blood pressure borderline in office today, continues with amlodipine, denies any issues with medication at this time.  Not having any current issues related to chest pain or headaches. Recommend intermittent monitoring of blood pressure at home, DASH diet Continue with amlodipine 5 mg daily  Post herpetic neuralgia Continues to have some neuralgia over area of right trunk where shingles rash was present.  He has been using gabapentin as prescribed by neurology.  Was using some lidocaine in the past, however does not necessarily feel like this provided significant benefit. Did discuss with patient that symptoms can persist for months.  In rare cases it can persist longer than this. Recommend continuing with current medication including gabapentin.  Could consider alternative topical therapies such as capsaicin.  Additional therapies are available as well if symptoms or not adequately controlled He is scheduled for follow-up with neurology in about 5 months.  Advised that if continuing to have notable symptoms, could also request to see them for follow-up sooner  Slow urinary stream Previously was referred to urology, has not heard from their office.  Generally indicating that this is not as much for concern for him at present.  He also discusses some erectile dysfunction concerns today.  Indicates that this was going on prior to most recent medical issue, hospitalization.  Certainly new medical problems, stress related to these problems and recent hospitalization cannot certainly exacerbate underlying erectile dysfunction.  Discussed options with patient, he  plans to monitor at this time.  Recommend evaluation with urology should symptoms persist or become more problematic for patient  Return in about 6 months (around 10/26/2022) for HTN.   ___________________________________________ Jazyiah Yiu de 10/28/2022, MD, ABFM, CAQSM Primary Care and Sports Medicine [facility]

## 2022-04-26 NOTE — Patient Instructions (Signed)
  Medication Instructions:  Your physician recommends that you continue on your current medications as directed. Please refer to the Current Medication list given to you today. --If you need a refill on any your medications before your next appointment, please call your pharmacy first. If no refills are authorized on file call the office.-- Lab Work: Your physician has recommended that you have lab work today: Yes If you have labs (blood work) drawn today and your tests are completely normal, you will receive your results via MyChart message OR a phone call from our staff.  Please ensure you check your voicemail in the event that you authorized detailed messages to be left on a delegated number. If you have any lab test that is abnormal or we need to change your treatment, we will call you to review the results.  Referrals/Procedures/Imaging: No  Follow-Up: Your next appointment:   Your physician recommends that you schedule a follow-up appointment in: 6 months with Dr. de Cuba.  You will receive a text message or e-mail with a link to a survey about your care and experience with us today! We would greatly appreciate your feedback!   Thanks for letting us be apart of your health journey!!  Primary Care and Sports Medicine   Dr. Raymond de Cuba   We encourage you to activate your patient portal called "MyChart".  Sign up information is provided on this After Visit Summary.  MyChart is used to connect with patients for Virtual Visits (Telemedicine).  Patients are able to view lab/test results, encounter notes, upcoming appointments, etc.  Non-urgent messages can be sent to your provider as well. To learn more about what you can do with MyChart, please visit --  [email or web address].    

## 2022-04-27 LAB — BASIC METABOLIC PANEL
BUN/Creatinine Ratio: 10 (ref 9–20)
BUN: 12 mg/dL (ref 6–24)
CO2: 23 mmol/L (ref 20–29)
Calcium: 9.9 mg/dL (ref 8.7–10.2)
Chloride: 104 mmol/L (ref 96–106)
Creatinine, Ser: 1.22 mg/dL (ref 0.76–1.27)
Glucose: 97 mg/dL (ref 70–99)
Potassium: 4.1 mmol/L (ref 3.5–5.2)
Sodium: 143 mmol/L (ref 134–144)
eGFR: 70 mL/min/{1.73_m2} (ref >59)

## 2022-04-27 LAB — CBC WITH DIFFERENTIAL/PLATELET
Basophils Absolute: 0 10*3/uL (ref 0.0–0.2)
Basos: 1 %
EOS (ABSOLUTE): 0.5 10*3/uL — ABNORMAL HIGH (ref 0.0–0.4)
Eos: 7 %
Hematocrit: 44.5 % (ref 37.5–51.0)
Hemoglobin: 15.6 g/dL (ref 13.0–17.7)
Immature Grans (Abs): 0 10*3/uL (ref 0.0–0.1)
Immature Granulocytes: 0 %
Lymphocytes Absolute: 2.7 10*3/uL (ref 0.7–3.1)
Lymphs: 43 %
MCH: 29.7 pg (ref 26.6–33.0)
MCHC: 35.1 g/dL (ref 31.5–35.7)
MCV: 85 fL (ref 79–97)
Monocytes Absolute: 0.6 10*3/uL (ref 0.1–0.9)
Monocytes: 9 %
Neutrophils Absolute: 2.5 10*3/uL (ref 1.4–7.0)
Neutrophils: 40 %
Platelets: 218 10*3/uL (ref 150–450)
RBC: 5.26 x10E6/uL (ref 4.14–5.80)
RDW: 14.3 % (ref 11.6–15.4)
WBC: 6.3 10*3/uL (ref 3.4–10.8)

## 2022-05-09 ENCOUNTER — Encounter: Payer: Self-pay | Admitting: Neurology

## 2022-05-15 ENCOUNTER — Ambulatory Visit (INDEPENDENT_AMBULATORY_CARE_PROVIDER_SITE_OTHER): Payer: BC Managed Care – PPO | Admitting: Neurology

## 2022-05-15 ENCOUNTER — Encounter: Payer: Self-pay | Admitting: Neurology

## 2022-05-15 VITALS — BP 154/95 | HR 97 | Ht 79.0 in | Wt 315.0 lb

## 2022-05-15 DIAGNOSIS — A86 Unspecified viral encephalitis: Secondary | ICD-10-CM | POA: Diagnosis not present

## 2022-05-15 DIAGNOSIS — G40909 Epilepsy, unspecified, not intractable, without status epilepticus: Secondary | ICD-10-CM

## 2022-05-15 DIAGNOSIS — B020 Zoster encephalitis: Secondary | ICD-10-CM | POA: Diagnosis not present

## 2022-05-15 DIAGNOSIS — R519 Headache, unspecified: Secondary | ICD-10-CM

## 2022-05-15 DIAGNOSIS — Z5181 Encounter for therapeutic drug level monitoring: Secondary | ICD-10-CM | POA: Diagnosis not present

## 2022-05-15 MED ORDER — BUTALBITAL-APAP-CAFFEINE 50-325-40 MG PO TABS: 1.0000 | ORAL_TABLET | Freq: Four times a day (QID) | ORAL | 0 refills | Status: DC | PRN

## 2022-05-15 NOTE — Progress Notes (Signed)
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 Accompanied by wife. Head pain.   INTERVAL HISTORY 05/15/22: Patient presents today for follow-up, he is accompanied by his wife.  Since last visit in May, he has been experiencing episodes of severe headaches and unresponsiveness.  He reported during these episodes, he can feel heat coming under his feet all the way up to his head followed by severe headache then he will be unconscious; this can last for 15 minutes up to 45 minutes.  During this time wife reported she is trying to talk to him but he is unresponsive or he will mumble.  After each episode, patient does not remember the episode at all.  In total he has happened 3 times.  They have not been to the hospital after each episode.  Feels heat coming up and feels very weak, feeling dizzy. He denies any other symptoms associated with the headaches. He still experiences the post herpetic neuralgia.     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  Patient reports presenting to his primary care doctor on May 5, for shingle, at that time they were drawing blood and patient starts complaining of not feeling well, he was on the wheelchair, wife noted that patient eyes rolled back with a blank stare and he went limp.  There was report patient being confused after the events.  Because of the seizure-like activity he was referred to [facility] health for admission.  His MRI did not show any acute abnormalities EEG was  normal, his LP was positive for VZV.  He was started on Keppra and antiviral.  Since discharge from the hospital he is complaining of headache and also post herpetic neuralgia.  He denies any additional seizures and denies any seizure risk factors.  He is compliant with the Keppra medication and denies any side effects.     Hospital summary  Patient is a 56 y.o.  male with history of HTN, HLD, aseptic meningitis in 2014-who developed shingles in his right flank area of his torso-started on Valtrex 3 days prior to this hospitalization-subsequently developed fever 1 day prior to this hospitalization-he was at his PCPs office when he was noted to have a generalized tonic-clonic seizure.  He was subsequently sent to the emergency room-and admitted to [facility] service.   Varicella-zoster encephalitis/meningitis: Headache, neck stiffness and seizures, also had active shingles in his right T7 dermatome.  Rash has dried up has been placed on IV acyclovir started on 03/02/2022 of [redacted] weeks along with with gentle IV fluids, stable EEG and MRI.  CSF serology positive for VZV, PICC line was placed on 03/06/2022 he was acyclovir stop date is 03/16/2022, he was seen by neurology along with ID and IR.  Headaches much better flank discomfort much improved, no further seizure activity. Currently on Keppra as well.  Will follow outpatient with neurology and ID postdischarge along with PCP.   Right T7 dermatome shingles: On IV acyclovir-on gabapentin for neuropathic pain.  Overall improving.    Handedness: Right handed  Onset: 03/02/22  Seizure Type: Unclear, possibly generalized  Current frequency: Only once  Any injuries from seizures: Denies  Seizure risk factors: Viral encephalitis  Previous ASMs: None  Currenty ASMs: Levetiracetam  ASMs side effects: Denies  Brain Images: Normal MRI brain  Previous EEGs: Normal EEG   OTHER MEDICAL CONDITIONS: Hypertension, Hyperlipidemia, Gout   REVIEW OF SYSTEMS: Full 14  system review of systems performed and negative with exception of: As noted in the HPI  ALLERGIES: No Known Allergies  HOME MEDICATIONS: Outpatient Medications Prior to Visit  Medication Sig Dispense Refill   acetaminophen (TYLENOL) 500 MG tablet Take 1,000 mg by mouth every 6 (six) hours as needed for mild pain.     amLODipine (NORVASC) 5 MG tablet Take 1 tablet (5 mg total) by mouth daily. 90 tablet 1   cyclobenzaprine (FLEXERIL) 10 MG tablet Take 1 tablet (10 mg total) by mouth 3 (three) times daily as needed for muscle spasms. 90 tablet 1   gabapentin (NEURONTIN) 300 MG capsule Take 2 capsules (600 mg total) by mouth 3 (three) times daily. 180 capsule 1   levETIRAcetam (KEPPRA) 500 MG tablet Take 1 tablet (500 mg total) by mouth 2 (two) times daily. 60 tablet 6   rosuvastatin (CRESTOR) 5 MG tablet Take 1 tablet (5 mg total) by mouth daily. 90 tablet 1   diclofenac Sodium (VOLTAREN) 1 % GEL Apply 4 g topically 4 (four) times daily. (Patient not taking: Reported on 05/15/2022) 350 g 1   No facility-administered medications prior to visit.    PAST MEDICAL HISTORY: Past Medical History:  Diagnosis Date   Acute gout 12/31/2016   Anemia 03/15/2022   Anxiety    "had taken Paxil; back in 1996; nothing since" (10/21/2013)   Arthritis    Complication of anesthesia    SLOW TO WAKE UP AFTER SURGERY IN 2019   Dyslipidemia 03/11/2016   12.5% 10-year risk of heart disease or stroke, should be on a moderate/high intensity statin.     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   Refusal of blood transfusions as patient is Jehovah's Witness     PAST SURGICAL HISTORY: Past Surgical History:  Procedure Laterality Date   COLONOSCOPY W/ POLYPECTOMY     INGUINAL HERNIA REPAIR Right 1995   INGUINAL HERNIA REPAIR Left 11/12/2013   Procedure: HERNIA REPAIR INGUINAL INCARCERATED;  Surgeon:  Adolph Pollackodd J Rosenbower, MD;  [facility];  Service: General;  Laterality: Left;   SHOULDER ARTHROSCOPY WITH OPEN ROTATOR CUFF REPAIR AND DISTAL CLAVICLE ACROMINECTOMY Right 06/02/2019   Procedure: right shoulder arthroscopy , mini open rotator cuff repair;  Surgeon: Cammy Copaean, Gregory Scott, MD;  [facility];  Service: Orthopedics;  Laterality: Right;   SHOULDER ARTHROSCOPY WITH SUBACROMIAL DECOMPRESSION, ROTATOR CUFF REPAIR AND BICEP TENDON REPAIR Right 07/14/2018   Procedure: RIGHT SHOULDER ARTHROSCOPY, DEBRIDEMENT, BICEPS TENODESIS, DISTAL CLAVICLE EXCISION AND MINI OPEN ROTATOR CUFF TEAR REPAIR;  Surgeon: Cammy Copaean, Gregory Scott, MD;  Location: [facility] OR;  Service: Orthopedics;  Laterality: Right;    FAMILY HISTORY: Family History  Problem Relation Age of Onset   Colon cancer Mother        colon   Cancer Mother    Cancer Sister        dont know what type    SOCIAL HISTORY: Social History   Socioeconomic History   Marital status: Married    Spouse name: Not  on file   Number of children: 3   Years of education: Not on file   Highest education level: Not on file  Occupational History   Occupation: Maintenance Tech  Tobacco Use   Smoking status: Never   Smokeless tobacco: Never  Vaping Use   Vaping Use: Never used  Substance and Sexual Activity   Alcohol use: Yes    Alcohol/week: 1.0 - 2.0 standard drink of alcohol    Types: 1 - 2 Shots of liquor per week    Comment: occasionally.   Drug use: No   Sexual activity: Yes    Birth control/protection: None  Other Topics Concern   Not on file  Social History Narrative   Enjoys bowling   Works as a Cabin crew Strain: Not on Ship broker Insecurity: Not on file  Transportation Needs: Not on file  Physical Activity: Not on file  Stress: Not on file  Social Connections: Not on file  Intimate Partner Violence: Not on file    PHYSICAL EXAM  GENERAL EXAM/CONSTITUTIONAL: Vitals:   Vitals:   05/15/22 1117  BP: (!) 154/95  Pulse: 97  Weight: (!) 315 lb (142.9 kg)  Height: 6\' 7"  (2.007 m)   Body mass index is 35.49 kg/m. Wt Readings from Last 3 Encounters:  05/15/22 (!) 315 lb (142.9 kg)  04/26/22 (!) 313 lb 12.8 oz (142.3 kg)  03/19/22 (!) 312 lb 8 oz (141.7 kg)   Patient is in no distress; well developed, nourished and groomed; neck is supple  EYES: Pupils round and reactive to light, Visual fields full to confrontation, Extraocular movements intacts,  No results found.  MUSCULOSKELETAL: Gait, strength, tone, movements noted in Neurologic exam below  NEUROLOGIC: MENTAL STATUS:      No data to display         awake, alert, oriented to person, place and time recent and remote memory intact normal attention and concentration language fluent, comprehension intact, naming intact fund of knowledge appropriate  CRANIAL NERVE:  2nd, 3rd, 4th, 6th - pupils equal and reactive to light, visual fields full to confrontation, extraocular muscles intact, no nystagmus 5th - facial sensation symmetric 7th - facial strength symmetric 8th - hearing intact 9th - palate elevates symmetrically, uvula midline 11th - shoulder shrug symmetric 12th - tongue protrusion midline  MOTOR:  normal bulk and tone, full strength in the BUE, BLE  SENSORY:  Large amount of healing vesicles on the right side of back and also right flank  COORDINATION:  finger-nose-finger, fine finger movements normal  REFLEXES:  deep tendon reflexes present and symmetric  GAIT/STATION:  normal   DIAGNOSTIC DATA (LABS, IMAGING, TESTING) - I reviewed patient records, labs, notes, testing and imaging myself where available.  Lab Results  Component Value Date   WBC 6.3 04/26/2022   HGB 15.6 04/26/2022   HCT 44.5 04/26/2022   MCV 85 04/26/2022   PLT 218 04/26/2022      Component Value Date/Time   NA 143 04/26/2022 1019   K 4.1 04/26/2022 1019   CL 104 04/26/2022 1019   CO2  23 04/26/2022 1019   GLUCOSE 97 04/26/2022 1019   GLUCOSE 101 (H) 03/07/2022 0434   BUN 12 04/26/2022 1019   CREATININE 1.22 04/26/2022 1019   CREATININE 1.10 10/30/2013 1138   CALCIUM 9.9 04/26/2022 1019   PROT 6.7 03/07/2022 0434   PROT 7.0 03/02/2022 0924   ALBUMIN 3.1 (L) 03/07/2022 0434  ALBUMIN 4.0 03/02/2022 0924   AST 24 03/07/2022 0434   ALT 30 03/07/2022 0434   ALKPHOS 58 03/07/2022 0434   BILITOT 0.5 03/07/2022 0434   BILITOT 1.7 (H) 03/02/2022 0924   GFRNONAA >60 03/07/2022 0434   GFRNONAA 80 10/30/2013 1138   GFRAA >60 06/02/2019 1145   GFRAA >89 10/30/2013 1138   Lab Results  Component Value Date   CHOL 222 (H) 02/05/2022   HDL 31 (L) 02/05/2022   LDLCALC 127 (H) 02/05/2022   TRIG 361 (H) 02/05/2022   Lab Results  Component Value Date   HGBA1C 5.5 02/05/2022   Lab Results  Component Value Date   VITAMINB12 367 12/31/2018   Lab Results  Component Value Date   TSH 2.010 02/05/2022    MRI Brain 03/04/22 1. No acute intracranial abnormality. 2. Focus of magnetic susceptibility effect at the paramedian undersurface of the right tentorial leaflet, likely an area of remote hemorrhage.   EEG 03/04/22 This study is within normal limits. The excessive beta activity seen in the background is most likely due to the effect of benzodiazepine and is a benign EEG pattern. No seizures or epileptiform discharges were seen throughout the recording.   ASSESSMENT AND PLAN  56 y.o. 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  Patient has completed treatment for viral encephalitis but now is complaining of severe episodic headache.  Since last visit in May he had 3 severe headache when he reported symptoms started with feeling hot and his feet going all the way up to his head followed by severe headache mainly right-sided then he will be unconscious  for 15 all the way up to 45 minutes.  During this time he has not tried any other medications.  At this time it is unclear what his symptoms are but I will start by getting an MRI of his head to rule out any reactivation of his viral encephalitis, also obtain an MRA head to rule out vasospasm. I will also repeat a routine EEG. I will contact the patient to go over the result, if they are normal, then will treat headaches as tension vs. TAC vs. Other type of headaches. They are both comfortable with plans. I will see him as scheduled in November.    1. Viral encephalitis   2. Zoster encephalitis   3. Seizure disorder (HCC)   4. Therapeutic drug monitoring   5. Increased severity of headaches      Patient Instructions  MRI Brain with and without contrast  MRA Head  Routine EEG  I will contact you after the completion of these tests  Follow up in November as scheduled or sooner if worse    Per [facility] statutes, patients with seizures are not allowed to drive until they have been seizure-free for six months.  Other recommendations include using caution when using heavy equipment or power tools. Avoid working on ladders or at heights. Take showers instead of baths.  Do not swim alone.  Ensure the water temperature is not too high on the home water heater. Do not go swimming alone. Do not lock yourself in a room alone (i.e. bathroom). When caring for infants or small children, sit down when holding, feeding, or changing them to minimize risk of injury to the child in the event you have a seizure. Maintain good sleep hygiene. Avoid alcohol.  Also recommend adequate sleep, hydration,  good diet and minimize stress.   During the Seizure  - First, ensure adequate ventilation and place patients on the floor on their left side  Loosen clothing around the neck and ensure the airway is patent. If the patient is clenching the teeth, do not force the mouth open with any object as this can cause  severe damage - Remove all items from the surrounding that can be hazardous. The patient may be oblivious to what's happening and may not even know what he or she is doing. If the patient is confused and wandering, either gently guide him/her away and block access to outside areas - Reassure the individual and be comforting - Call 911. In most cases, the seizure ends before EMS arrives. However, there are cases when seizures may last over 3 to 5 minutes. Or the individual may have developed breathing difficulties or severe injuries. If a pregnant patient or a person with diabetes develops a seizure, it is prudent to call an ambulance. - Finally, if the patient does not regain full consciousness, then call EMS. Most patients will remain confused for about 45 to 90 minutes after a seizure, so you must use judgment in calling for help. - Avoid restraints but make sure the patient is in a bed with padded side rails - Place the individual in a lateral position with the neck slightly flexed; this will help the saliva drain from the mouth and prevent the tongue from falling backward - Remove all nearby furniture and other hazards from the area - Provide verbal assurance as the individual is regaining consciousness - Provide the patient with privacy if possible - Call for help and start treatment as ordered by the caregiver   After the Seizure (Postictal Stage)  After a seizure, most patients experience confusion, fatigue, muscle pain and/or a headache. Thus, one should permit the individual to sleep. For the next few days, reassurance is essential. Being calm and helping reorient the person is also of importance.  Most seizures are painless and end spontaneously. Seizures are not harmful to others but can lead to complications such as stress on the lungs, brain and the heart. Individuals with prior lung problems may develop labored breathing and respiratory distress.     Orders Placed This Encounter   Procedures   MR BRAIN W WO CONTRAST   MR ANGIO HEAD WO W CONTRAST   Levetiracetam level   EEG adult    Meds ordered this encounter  Medications   butalbital-acetaminophen-caffeine (FIORICET) 50-325-40 MG tablet    Sig: Take 1 tablet by mouth every 6 (six) hours as needed for headache.    Dispense:  30 tablet    Refill:  0    No follow-ups on file.  I have spent a total of 50 minutes dedicated to this patient today, preparing to see patient, performing a medically appropriate examination and evaluation, ordering tests and/or medications and procedures, and counseling and educating the patient/family/caregiver; independently interpreting result and communicating results to the family/patient/caregiver; and documenting clinical information in the electronic medical record.    Windell Norfolk, MD 05/15/2022, 6:14 PM  [facility] [address] [place], [place] [address] [phone]

## 2022-05-15 NOTE — Patient Instructions (Addendum)
MRI Brain with and without contrast  MRA Head  Routine EEG  I will contact you after the completion of these tests  Follow up in November as scheduled or sooner if worse

## 2022-05-16 LAB — LEVETIRACETAM LEVEL: Levetiracetam Lvl: 11.5 ug/mL (ref 10.0–40.0)

## 2022-05-17 ENCOUNTER — Telehealth: Payer: Self-pay | Admitting: Neurology

## 2022-05-17 NOTE — Telephone Encounter (Signed)
MRA head BC[employer] auth: [id number] exp. 05/16/22-06/14/22 sent to GI  MRI BCBS auth: [id number] exp. 05/16/22-06/14/21 sent to GI due to patient's weight

## 2022-05-24 ENCOUNTER — Ambulatory Visit: Payer: BC Managed Care – PPO | Admitting: Neurology

## 2022-05-24 DIAGNOSIS — G40909 Epilepsy, unspecified, not intractable, without status epilepticus: Secondary | ICD-10-CM | POA: Diagnosis not present

## 2022-05-24 NOTE — Procedures (Signed)
    History:  56 year old man with episodes of altered mental status   EEG classification:  Awake and asleep  Description of the recording: The background rhythms was symmetric with no clear PDR, patient spent most of the time in drowsiness and sleep. As the record progresses, the patient initially is in the waking state, but appears to enter the early stage II sleep during the recording, with rudimentary sleep spindles and vertex sharp wave activity seen. During the wakeful state, photic stimulation is performed, and no abnormal responses were seen. Hyperventilation was also performed, no abnormal response seen. No epileptiform discharges seen during this recording. There was no focal slowing. EKG monitor shows no evidence of cardiac rhythm abnormalities with a heart rate of 60.  Abnormality: None   Impression: This is a normal EEG recording in the waking and sleeping state. No evidence interictal epileptiform discharges were seen at any time during the recording.  A normal EEG does not exclude a diagnosis of epilepsy.    Windell Norfolk, MD [facility]

## 2022-05-31 ENCOUNTER — Other Ambulatory Visit: Payer: BC Managed Care – PPO

## 2022-06-13 ENCOUNTER — Telehealth: Payer: Self-pay

## 2022-06-13 NOTE — Telephone Encounter (Signed)
Received medical release form to have last office note paxed to [facility]. Faxed office note from 5/22. P: [phone] [id number] F: [phone] Juanita Laster, RMA

## 2022-07-03 ENCOUNTER — Other Ambulatory Visit: Payer: Self-pay | Admitting: Neurology

## 2022-07-05 NOTE — Telephone Encounter (Signed)
Last visit: 05/15/2022 Next visit: 09/25/2022

## 2022-07-08 MED ORDER — BUTALBITAL-APAP-CAFFEINE 50-325-40 MG PO TABS: 1.0000 | ORAL_TABLET | Freq: Four times a day (QID) | ORAL | 0 refills | Status: AC | PRN

## 2022-07-09 ENCOUNTER — Other Ambulatory Visit (HOSPITAL_BASED_OUTPATIENT_CLINIC_OR_DEPARTMENT_OTHER): Payer: Self-pay | Admitting: Family Medicine

## 2022-07-09 DIAGNOSIS — B029 Zoster without complications: Secondary | ICD-10-CM

## 2022-07-09 DIAGNOSIS — I10 Essential (primary) hypertension: Secondary | ICD-10-CM

## 2022-07-09 DIAGNOSIS — E782 Mixed hyperlipidemia: Secondary | ICD-10-CM

## 2022-07-16 ENCOUNTER — Encounter: Payer: Self-pay | Admitting: Neurology

## 2022-08-10 ENCOUNTER — Other Ambulatory Visit: Payer: Self-pay | Admitting: Neurology

## 2022-08-10 ENCOUNTER — Other Ambulatory Visit (HOSPITAL_BASED_OUTPATIENT_CLINIC_OR_DEPARTMENT_OTHER): Payer: Self-pay | Admitting: Family Medicine

## 2022-08-10 ENCOUNTER — Other Ambulatory Visit: Payer: Self-pay

## 2022-08-10 MED ORDER — ACETAMINOPHEN 500 MG PO TABS
1000.0000 mg | ORAL_TABLET | Freq: Four times a day (QID) | ORAL | 1 refills | Status: AC | PRN
  Filled 2022-08-10: qty 90, 12d supply, fill #0

## 2022-08-13 NOTE — Telephone Encounter (Signed)
Pt last appt was on 7/18 and has a upcoming appt on 11/28. Pt last refill in the registry was on 03/07/22. Please advise

## 2022-08-13 NOTE — Telephone Encounter (Signed)
Please inform patient that the Fioricet was just a one time prescription, advise him to take the Gabapentin for headache until I see him next month.

## 2022-09-06 ENCOUNTER — Ambulatory Visit (INDEPENDENT_AMBULATORY_CARE_PROVIDER_SITE_OTHER): Payer: Commercial Managed Care - HMO | Admitting: Family Medicine

## 2022-09-06 ENCOUNTER — Encounter (HOSPITAL_BASED_OUTPATIENT_CLINIC_OR_DEPARTMENT_OTHER): Payer: Self-pay | Admitting: Family Medicine

## 2022-09-06 VITALS — BP 143/87 | HR 107 | Temp 97.7°F | Ht 79.0 in | Wt 326.6 lb

## 2022-09-06 DIAGNOSIS — M25511 Pain in right shoulder: Secondary | ICD-10-CM | POA: Insufficient documentation

## 2022-09-06 DIAGNOSIS — I10 Essential (primary) hypertension: Secondary | ICD-10-CM | POA: Diagnosis not present

## 2022-09-06 DIAGNOSIS — G8929 Other chronic pain: Secondary | ICD-10-CM | POA: Diagnosis not present

## 2022-09-06 MED ORDER — HYDROCHLOROTHIAZIDE 12.5 MG PO TABS: 12.5000 mg | ORAL_TABLET | Freq: Every day | ORAL | 1 refills | Status: DC

## 2022-09-06 NOTE — Patient Instructions (Signed)
  Medication Instructions:  Your physician recommends that you continue on your current medications as directed. Please refer to the Current Medication list given to you today. --If you need a refill on any your medications before your next appointment, please call your pharmacy first. If no refills are authorized on file call the office.-- Lab Work: Your physician has recommended that you have lab work today: No If you have labs (blood work) drawn today and your tests are completely normal, you will receive your results via MyChart message OR a phone call from our staff.  Please ensure you check your voicemail in the event that you authorized detailed messages to be left on a delegated number. If you have any lab test that is abnormal or we need to change your treatment, we will call you to review the results.  Referrals/Procedures/Imaging: No  Follow-Up: Your next appointment:   Your physician recommends that you schedule a follow-up appointment in: 2-3 months with Dr. de Cuba.  You will receive a text message or e-mail with a link to a survey about your care and experience with us today! We would greatly appreciate your feedback!   Thanks for letting us be apart of your health journey!!  Primary Care and Sports Medicine   Dr. Raymond de Cuba   We encourage you to activate your patient portal called "MyChart".  Sign up information is provided on this After Visit Summary.  MyChart is used to connect with patients for Virtual Visits (Telemedicine).  Patients are able to view lab/test results, encounter notes, upcoming appointments, etc.  Non-urgent messages can be sent to your provider as well. To learn more about what you can do with MyChart, please visit --  [email or web address].    

## 2022-09-06 NOTE — Assessment & Plan Note (Addendum)
Blood pressure has been borderline elevated at recent office visits as well as at home.  Patient feels it could be related to his underlying pain, although has had some elevated readings previously.  At this time he continues with amlodipine.  Denies any current issues with chest pain or headaches. Given continued elevations, discussed consideration for adjustment of pharmacotherapy to aid in better control of blood pressure.  Patient is amenable to this, we will continue with amlodipine and we will add hydrochlorothiazide to current regimen.  Recommend intermittent monitoring of blood pressure at home, DASH diet plan for close follow-up to monitor response to blood pressure Recommend BMP in about 1 to 2 weeks to monitor electrolytes and kidney function with starting thiazide diuretic

## 2022-09-06 NOTE — Progress Notes (Signed)
    Procedures performed today:    None.  Independent interpretation of notes and tests performed by another provider:   None.  Brief History, Exam, Impression, and Recommendations:    BP (!) 143/87   Pulse (!) 107   Temp 97.7 F (36.5 C) (Oral)   Ht 6\' 7"  (2.007 m)   Wt (!) 326 lb 9.6 oz (148.1 kg)   SpO2 100%   BMI 36.79 kg/m   Right shoulder pain Patient presenting today for evaluation of right shoulder pain.  This has been an ongoing, chronic intermittent issue for patient.  He previously has had rotator cuff surgery, distal clavicle excision, biceps tenodesis.  More recently, he did have redo of rotator cuff repair, revision AC joint resection.  Currently he is having pain in the area of distal clavicle excision over shoulder.  He also reports occasionally having some tingling down his arm.  He primarily indicates some the tingling being near the elbow and extending down into the hand. On exam, patient does have some tenderness to palpation over prior surgical sites, resected and of distal clavicle.  He does have some restrictions in active and passive range of motion.  Pain is elicited with empty can testing, Hawkins, Neer's.  Has some restriction in abduction with some guarding present.  He does have positive Tinel's over cubital tunnel. Discussed options with patient today, he would like referral to alternative orthopedic surgeon for further evaluation, second opinion.  Referral placed to Dr. downstairs.  We also discussed possible aggravation of ulnar nerve at cubital tunnel and consideration of wrapping towel around the elbow to help with limiting symptoms while sleeping.  Essential hypertension, benign Blood pressure has been borderline elevated at recent office visits as well as at home.  Patient feels it could be related to his underlying pain, although has had some elevated readings previously.  At this time he continues with amlodipine.  Denies any current issues  with chest pain or headaches. Given continued elevations, discussed consideration for adjustment of pharmacotherapy to aid in better control of blood pressure.  Patient is amenable to this, we will continue with amlodipine and we will add hydrochlorothiazide to current regimen.  Recommend intermittent monitoring of blood pressure at home, DASH diet plan for close follow-up to monitor response to blood pressure Recommend BMP in about 1 to 2 weeks to monitor electrolytes and kidney function with starting thiazide diuretic  Return in about 3 months (around 12/07/2022) for HTN.   ___________________________________________ Marc Sivertsen de 02/05/2023, MD, ABFM, CAQSM Primary Care and Sports Medicine [facility]

## 2022-09-06 NOTE — Assessment & Plan Note (Signed)
Patient presenting today for evaluation of right shoulder pain.  This has been an ongoing, chronic intermittent issue for patient.  He previously has had rotator cuff surgery, distal clavicle excision, biceps tenodesis.  More recently, he did have redo of rotator cuff repair, revision AC joint resection.  Currently he is having pain in the area of distal clavicle excision over shoulder.  He also reports occasionally having some tingling down his arm.  He primarily indicates some the tingling being near the elbow and extending down into the hand. On exam, patient does have some tenderness to palpation over prior surgical sites, resected and of distal clavicle.  He does have some restrictions in active and passive range of motion.  Pain is elicited with empty can testing, Hawkins, Neer's.  Has some restriction in abduction with some guarding present.  He does have positive Tinel's over cubital tunnel. Discussed options with patient today, he would like referral to alternative orthopedic surgeon for further evaluation, second opinion.  Referral placed to Dr. Steward Drone downstairs.  We also discussed possible aggravation of ulnar nerve at cubital tunnel and consideration of wrapping towel around the elbow to help with limiting symptoms while sleeping.

## 2022-09-07 ENCOUNTER — Ambulatory Visit (INDEPENDENT_AMBULATORY_CARE_PROVIDER_SITE_OTHER): Payer: Commercial Managed Care - HMO

## 2022-09-07 ENCOUNTER — Other Ambulatory Visit (HOSPITAL_BASED_OUTPATIENT_CLINIC_OR_DEPARTMENT_OTHER): Payer: Self-pay

## 2022-09-07 ENCOUNTER — Ambulatory Visit (INDEPENDENT_AMBULATORY_CARE_PROVIDER_SITE_OTHER): Payer: Commercial Managed Care - HMO | Admitting: Orthopaedic Surgery

## 2022-09-07 DIAGNOSIS — G8929 Other chronic pain: Secondary | ICD-10-CM

## 2022-09-07 DIAGNOSIS — M25511 Pain in right shoulder: Secondary | ICD-10-CM

## 2022-09-07 DIAGNOSIS — M5412 Radiculopathy, cervical region: Secondary | ICD-10-CM

## 2022-09-07 MED ORDER — METHOCARBAMOL 500 MG PO TABS
500.0000 mg | ORAL_TABLET | Freq: Three times a day (TID) | ORAL | 3 refills | Status: AC
  Filled 2022-09-07: qty 30, 10d supply, fill #0
  Filled 2022-09-11: qty 30, 10d supply, fill #1

## 2022-09-07 NOTE — Progress Notes (Signed)
Chief Complaint: Right shoulder pain     History of Present Illness:    Darrell King is a 56 y.o. male presents today with ongoing right shoulder pain in the setting of previous rotator cuff repair.  This was done by Dr. August Saucer in 2019 2021.  Today symptoms are more involving the upper trapezius area and radiating down the right arm to the small 3 fingers.  He states that he has pain with overhead lifting of the arm.  He is right-hand dominant.  He is currently on disability.  He is here today for assessment.    Surgical History:   As above  PMH/PSH/Family History/Social History/Meds/Allergies:    Past Medical History:  Diagnosis Date   Acute gout 12/31/2016   Anemia 03/15/2022   Anxiety    "had taken Paxil; back in 1996; nothing since" (10/21/2013)   Arthritis    Complication of anesthesia    SLOW TO WAKE UP AFTER SURGERY IN 2019   Dyslipidemia 03/11/2016   12.5% 10-year risk of heart disease or stroke, should be on a moderate/high intensity statin.     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   Refusal of blood transfusions as patient is Jehovah's Witness    Past Surgical History:  Procedure Laterality Date   COLONOSCOPY W/ POLYPECTOMY     INGUINAL HERNIA REPAIR Right 1995   INGUINAL HERNIA REPAIR Left 11/12/2013   Procedure: HERNIA REPAIR INGUINAL INCARCERATED;  Surgeon: Adolph Pollack, MD;  Location: [facility] OR;  Service: General;  Laterality: Left;   SHOULDER ARTHROSCOPY WITH OPEN ROTATOR CUFF REPAIR AND DISTAL CLAVICLE ACROMINECTOMY Right 06/02/2019   Procedure: right shoulder arthroscopy , mini open rotator cuff repair;  Surgeon: Cammy Copa, MD;  Location: [facility] OR;  Service: Orthopedics;  Laterality: Right;   SHOULDER ARTHROSCOPY WITH SUBACROMIAL DECOMPRESSION, ROTATOR CUFF REPAIR AND BICEP TENDON REPAIR Right  07/14/2018   Procedure: RIGHT SHOULDER ARTHROSCOPY, DEBRIDEMENT, BICEPS TENODESIS, DISTAL CLAVICLE EXCISION AND MINI OPEN ROTATOR CUFF TEAR REPAIR;  Surgeon: Cammy Copa, MD;  Location: [facility] OR;  Service: Orthopedics;  Laterality: Right;   Social History   Socioeconomic History   Marital status: Married    Spouse name: Not on file   Number of children: 3   Years of education: Not on file   Highest education level: Not on file  Occupational History   Occupation: Maintenance Tech  Tobacco Use   Smoking status: Never   Smokeless tobacco: Never  Vaping Use   Vaping Use: Never used  Substance and Sexual Activity   Alcohol use: Yes    Alcohol/week: 1.0 - 2.0 standard drink of alcohol    Types: 1 - 2 Shots of liquor per week    Comment: occasionally.   Drug use: No   Sexual activity: Yes    Birth control/protection: None  Other Topics Concern   Not on file  Social History Narrative   Enjoys bowling   Works as a Cabin crew Strain: Not on file  Food Insecurity: Not on file  Transportation Needs: Not on file  Physical Activity: Not on file  Stress: Not on file  Social Connections: Not on file  Family History  Problem Relation Age of Onset   Colon cancer Mother        colon   Cancer Mother    Cancer Sister        dont know what type   No Known Allergies Current Outpatient Medications  Medication Sig Dispense Refill   methocarbamol (ROBAXIN) 500 MG tablet Take 1 tablet (500 mg total) by mouth 3 (three) times daily. 30 tablet 3   acetaminophen (TYLENOL) 500 MG tablet Take 2 tablets (1,000 mg total) by mouth every 6 (six) hours as needed for mild pain. 90 tablet 1   amLODipine (NORVASC) 5 MG tablet TAKE 1 TABLET (5 MG TOTAL) BY MOUTH DAILY. 90 tablet 1   butalbital-acetaminophen-caffeine (FIORICET) 50-325-40 MG tablet Take 1 tablet by mouth every 6 (six) hours as needed for headache. 30 tablet 0   cyclobenzaprine  (FLEXERIL) 10 MG tablet TAKE 1 TABLET BY MOUTH THREE TIMES A DAY AS NEEDED FOR MUSCLE SPASMS 90 tablet 1   diclofenac Sodium (VOLTAREN) 1 % GEL Apply 4 g topically 4 (four) times daily. 350 g 1   gabapentin (NEURONTIN) 300 MG capsule TAKE 2 CAPSULES BY MOUTH 3 TIMES DAILY. 180 capsule 1   hydrochlorothiazide (HYDRODIURIL) 12.5 MG tablet Take 1 tablet (12.5 mg total) by mouth daily. 90 tablet 1   levETIRAcetam (KEPPRA) 500 MG tablet Take 1 tablet (500 mg total) by mouth 2 (two) times daily. 60 tablet 6   rosuvastatin (CRESTOR) 5 MG tablet TAKE 1 TABLET (5 MG TOTAL) BY MOUTH DAILY. 90 tablet 1   No current facility-administered medications for this visit.   No results found.  Review of Systems:   A ROS was performed including pertinent positives and negatives as documented in the HPI.  Physical Exam :   Constitutional: NAD and appears stated age Neurological: Alert and oriented Psych: Appropriate affect and cooperative There were no vitals taken for this visit.   Comprehensive Musculoskeletal Exam:    Musculoskeletal Exam    Inspection Right Left  Skin No atrophy or winging No atrophy or winging  Palpation    Tenderness Upper trapezius None  Range of Motion    Flexion (passive) 170 170  Flexion (active) 140 170  Abduction 100 170  ER at the side 50 1 70  Can reach behind back to T12 T12  Strength     Full with pain Full  Special Tests    Pseudoparalytic No No  Neurologic    Fires PIN, radial, median, ulnar, musculocutaneous, axillary, suprascapular, long thoracic, and spinal accessory innervated muscles. No abnormal sensibility  Vascular/Lymphatic    Radial Pulse 2+ 2+  Cervical Exam    Patient has symmetric cervical range of motion with negative Spurling's test.  Special Test: none     Imaging:     I personally reviewed and interpreted the radiographs.   Assessment:   56 y.o. male presents today with right upper neck pain radiating down the arm.  I did describe  that on my exam today it appears that at least 80% or greater of his symptoms appear to be emanating from his neck.  This has been going on for course for several years.  He has previously had physical therapy without improvement.  That affected believe that an MRI of the cervical spine is necessary to rule out any type of disc herniation.  In the meantime I would like him to continue with physical therapy as I do believe that he could benefit from some  dry needling and work on his upper trap and some of the elevators of his arm.  We will plan to proceed with this.  I will see him back following MRI discuss results  Plan :    -Return to clinic following MRI to discuss results     I personally saw and evaluated the patient, and participated in the management and treatment plan.  Huel Cote, MD Attending Physician, Orthopedic Surgery  This document was dictated using Dragon voice recognition software. A reasonable attempt at proof reading has been made to minimize errors.

## 2022-09-11 ENCOUNTER — Other Ambulatory Visit (HOSPITAL_BASED_OUTPATIENT_CLINIC_OR_DEPARTMENT_OTHER): Payer: Self-pay

## 2022-09-12 ENCOUNTER — Telehealth: Payer: Self-pay | Admitting: Orthopaedic Surgery

## 2022-09-12 NOTE — Telephone Encounter (Signed)
Maricela Curet Fields (notification rep) from [employer] called with notification for pt. Procedure was denied. If need to call with questions call [phone] press option 4 reference number [id number]

## 2022-09-13 ENCOUNTER — Other Ambulatory Visit (HOSPITAL_BASED_OUTPATIENT_CLINIC_OR_DEPARTMENT_OTHER): Payer: Self-pay | Admitting: Orthopaedic Surgery

## 2022-09-13 DIAGNOSIS — M5412 Radiculopathy, cervical region: Secondary | ICD-10-CM

## 2022-09-13 NOTE — Telephone Encounter (Signed)
RC to [employer] who stated MRI was denied due to not having directed physical therapy

## 2022-09-25 ENCOUNTER — Encounter: Payer: Self-pay | Admitting: Neurology

## 2022-09-25 ENCOUNTER — Ambulatory Visit: Payer: Commercial Managed Care - HMO | Admitting: Neurology

## 2022-09-25 VITALS — BP 160/106 | HR 88 | Ht 79.0 in | Wt 328.0 lb

## 2022-09-25 DIAGNOSIS — B0229 Other postherpetic nervous system involvement: Secondary | ICD-10-CM

## 2022-09-25 DIAGNOSIS — R55 Syncope and collapse: Secondary | ICD-10-CM

## 2022-09-25 DIAGNOSIS — G40909 Epilepsy, unspecified, not intractable, without status epilepticus: Secondary | ICD-10-CM | POA: Diagnosis not present

## 2022-09-25 DIAGNOSIS — Z8661 Personal history of infections of the central nervous system: Secondary | ICD-10-CM | POA: Diagnosis not present

## 2022-09-25 DIAGNOSIS — I10 Essential (primary) hypertension: Secondary | ICD-10-CM | POA: Diagnosis not present

## 2022-09-25 MED ORDER — AMLODIPINE BESYLATE 10 MG PO TABS: 10.0000 mg | ORAL_TABLET | Freq: Every day | ORAL | 11 refills | Status: DC

## 2022-09-25 MED ORDER — PREGABALIN 75 MG PO CAPS: 75.0000 mg | ORAL_CAPSULE | Freq: Two times a day (BID) | ORAL | 5 refills | Status: DC

## 2022-09-25 NOTE — Patient Instructions (Addendum)
Continue Keppra 500 mg twice daily, advised wife to video record the next event Will also request a 30-day cardiac monitor to rule out arrhythmia causing these events Discontinue gabapentin and start pregabalin 75 mg twice daily, will increase to 150 mg twice daily as needed.  Also provided patient with a sample of ZTLido, he will contact us to update [place] if it is helpful Increase amlodipine to 10 mg daily and continue to follow with PCP for better blood pressure management Follow-up in 6 months or sooner if worse.

## 2022-09-25 NOTE — Progress Notes (Signed)
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 Accompanied by wife. Discuss medication reduction.    INTERVAL HISTORY 09/25/22:  Patient presents today for follow-up, he is accompanied by wife.  At last visit in July, we had plan to repeat his MRI/MRA  but he lost his insurance and was not able to complete the tests. He reports the shingle pain is still present.  Currently he is on gabapentin 600 mg 3 times a day.  Due to side effect he is interested in coming down on the dose. When he comes to the seizures, wife reported 3 events where he is unresponsive and have mild tremor.  The last event was 3 weeks ago when he was holding his grandchild.  Patient does not remember these events.  She denies any generalized tonic-clonic seizure like the one that happened in the hospital. He is still out of work due to ongoing symptoms.    INTERVAL HISTORY 05/15/22: Patient presents today for follow-up, he is accompanied by his wife.  Since last visit in May, he has been experiencing episodes of severe headaches and unresponsiveness.  He reported during these episodes, he can feel heat coming under his feet all the way up to his head followed by severe headache then he will be unconscious; this can last for 15 minutes up to 45 minutes.  During this time wife reported she is trying to talk to him but he is unresponsive or he will mumble.  After each episode, patient does not remember the episode at all.  In total he has happened 3 times.  They have not been to the hospital after each episode.  Feels heat coming up and feels very weak, feeling dizzy. He denies any other symptoms associated with the headaches. He still experiences the post  herpetic neuralgia.    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  Patient reports presenting to his primary care doctor on May 5, for shingle, at that time they were drawing blood and patient starts complaining of not feeling well, he was on the wheelchair, wife noted that patient eyes rolled back with a blank stare and he went limp.  There was report patient being confused after the events.  Because of the seizure-like activity he was referred to [facility] for admission.  His MRI did not show any acute abnormalities EEG was normal, his LP was positive for VZV.  He was started on Keppra and antiviral.  Since discharge from the hospital he is complaining of headache and also post herpetic neuralgia.  He denies any additional seizures and denies any seizure risk factors.  He is compliant with the Keppra medication and denies any side effects.     Hospital summary  Patient is a 56 y.o.  male with history of HTN, HLD, aseptic meningitis in 2014-who developed shingles in his right flank area of his torso-started on Valtrex 3 days prior to this hospitalization-subsequently developed fever 1 day prior to this hospitalization-he was at his PCPs office when he was noted to  have a generalized tonic-clonic seizure.  He was subsequently sent to the emergency room-and admitted to [facility] service.   Varicella-zoster encephalitis/meningitis: Headache, neck stiffness and seizures, also had active shingles in his right T7 dermatome.  Rash has dried up has been placed on IV acyclovir started on 03/02/2022 of [redacted] weeks along with with gentle IV fluids, stable EEG and MRI.  CSF serology positive for VZV, PICC line was placed on 03/06/2022 he was acyclovir stop date is 03/16/2022, he was seen by neurology  along with ID and IR.  Headaches much better flank discomfort much improved, no further seizure activity. Currently on Keppra as well.  Will follow outpatient with neurology and ID postdischarge along with PCP.   Right T7 dermatome shingles: On IV acyclovir-on gabapentin for neuropathic pain.  Overall improving.  Handedness: Right handed   Onset: 03/02/22  Seizure Type: Unclear, possibly generalized  Current frequency: Only once  Any injuries from seizures: Denies  Seizure risk factors: Viral encephalitis  Previous ASMs: None  Currenty ASMs: Levetiracetam 500 mg BID  ASMs side effects: Denies  Brain Images: Normal MRI brain  Previous EEGs: Normal EEG   OTHER MEDICAL CONDITIONS: Hypertension, Hyperlipidemia, Gout   REVIEW OF SYSTEMS: Full 14 system review of systems performed and negative with exception of: As noted in the HPI  ALLERGIES: No Known Allergies  HOME MEDICATIONS: Outpatient Medications Prior to Visit  Medication Sig Dispense Refill   acetaminophen (TYLENOL) 500 MG tablet Take 2 tablets (1,000 mg total) by mouth every 6 (six) hours as needed for mild pain. 90 tablet 1   butalbital-acetaminophen-caffeine (FIORICET) 50-325-40 MG tablet Take 1 tablet by mouth every 6 (six) hours as needed for headache. 30 tablet 0   cyclobenzaprine (FLEXERIL) 10 MG tablet TAKE 1 TABLET BY MOUTH THREE TIMES A DAY AS NEEDED FOR MUSCLE SPASMS 90 tablet 1   diclofenac Sodium (VOLTAREN) 1 % GEL Apply 4 g topically 4 (four) times daily. 350 g 1   hydrochlorothiazide (HYDRODIURIL) 12.5 MG tablet Take 1 tablet (12.5 mg total) by mouth daily. 90 tablet 1   levETIRAcetam (KEPPRA) 500 MG tablet Take 1 tablet (500 mg total) by mouth 2 (two) times daily. 60 tablet 6   methocarbamol (ROBAXIN) 500 MG tablet Take 1 tablet (500 mg total) by mouth 3 (three) times daily. 30 tablet 3   rosuvastatin (CRESTOR) 5 MG tablet TAKE 1 TABLET (5 MG TOTAL) BY MOUTH DAILY. 90 tablet 1   amLODipine (NORVASC) 5 MG  tablet TAKE 1 TABLET (5 MG TOTAL) BY MOUTH DAILY. 90 tablet 1   gabapentin (NEURONTIN) 300 MG capsule TAKE 2 CAPSULES BY MOUTH 3 TIMES DAILY. 180 capsule 1   No facility-administered medications prior to visit.    PAST MEDICAL HISTORY: Past Medical History:  Diagnosis Date   Acute gout 12/31/2016   Anemia 03/15/2022   Anxiety    "had taken Paxil; back in 1996; nothing since" (10/21/2013)   Arthritis    Complication of anesthesia    SLOW TO WAKE UP AFTER SURGERY IN 2019   Dyslipidemia 03/11/2016   12.5% 10-year risk of heart disease or stroke, should be on a moderate/high intensity statin.     Encounter for hepatitis C screening test for low risk patient 02/02/2021   Essential hypertension, benign 02/23/2016   New onset seizure ([place]) 03/02/2022   Refusal of blood transfusions as patient is Jehovah's Witness     PAST SURGICAL HISTORY: Past Surgical History:  Procedure Laterality Date   COLONOSCOPY W/ POLYPECTOMY  INGUINAL HERNIA REPAIR Right 1995   INGUINAL HERNIA REPAIR Left 11/12/2013   Procedure: HERNIA REPAIR INGUINAL INCARCERATED;  Surgeon: Odis Hollingshead, MD;  Location: [place];  Service: General;  Laterality: Left;   SHOULDER ARTHROSCOPY WITH OPEN ROTATOR CUFF REPAIR AND DISTAL CLAVICLE ACROMINECTOMY Right 06/02/2019   Procedure: right shoulder arthroscopy , mini open rotator cuff repair;  Surgeon: Meredith Pel, MD;  Location: [place];  Service: Orthopedics;  Laterality: Right;   SHOULDER ARTHROSCOPY WITH SUBACROMIAL DECOMPRESSION, ROTATOR CUFF REPAIR AND BICEP TENDON REPAIR Right 07/14/2018   Procedure: RIGHT SHOULDER ARTHROSCOPY, DEBRIDEMENT, BICEPS TENODESIS, DISTAL CLAVICLE EXCISION AND MINI OPEN ROTATOR CUFF TEAR REPAIR;  Surgeon: Meredith Pel, MD;  Location: [place];  Service: Orthopedics;  Laterality: Right;    FAMILY HISTORY: Family History  Problem Relation Age of Onset   Colon cancer Mother        colon   Cancer Mother    Cancer Sister        dont  know what type    SOCIAL HISTORY: Social History   Socioeconomic History   Marital status: Married    Spouse name: Not on file   Number of children: 3   Years of education: Not on file   Highest education level: Not on file  Occupational History   Occupation: Maintenance Tech  Tobacco Use   Smoking status: Never   Smokeless tobacco: Never  Vaping Use   Vaping Use: Never used  Substance and Sexual Activity   Alcohol use: Yes    Alcohol/week: 1.0 - 2.0 standard drink of alcohol    Types: 1 - 2 Shots of liquor per week    Comment: occasionally.   Drug use: No   Sexual activity: Yes    Birth control/protection: None  Other Topics Concern   Not on file  Social History Narrative   Enjoys bowling   Works as a Games developer Strain: Not on Art therapist Insecurity: Not on file  Transportation Needs: Not on file  Physical Activity: Not on file  Stress: Not on file  Social Connections: Not on file  Intimate Partner Violence: Not on file    PHYSICAL EXAM  GENERAL EXAM/CONSTITUTIONAL: Vitals:  Vitals:   09/25/22 1347  BP: (!) 160/106  Pulse: 88  Weight: (!) 328 lb (148.8 kg)  Height: 6\' 7"  (2.007 m)   Body mass index is 36.95 kg/m. Wt Readings from Last 3 Encounters:  09/25/22 (!) 328 lb (148.8 kg)  09/06/22 (!) 326 lb 9.6 oz (148.1 kg)  05/15/22 (!) 315 lb (142.9 kg)   Patient is in no distress; well developed, nourished and groomed; neck is supple  EYES: Visual fields full to confrontation, Extraocular movements intacts,  No results found.  MUSCULOSKELETAL: Gait, strength, tone, movements noted in Neurologic exam below  NEUROLOGIC: MENTAL STATUS:      No data to display         awake, alert, oriented to person, place and time recent and remote memory intact normal attention and concentration language fluent, comprehension intact, naming intact fund of knowledge appropriate  CRANIAL NERVE:  2nd,  3rd, 4th, 6th - visual fields full to confrontation, extraocular muscles intact, no nystagmus 5th - facial sensation symmetric 7th - facial strength symmetric 8th - hearing intact 9th - palate elevates symmetrically, uvula midline 11th - shoulder shrug symmetric 12th - tongue protrusion midline  MOTOR:  normal bulk and tone, full strength in  the BUE, BLE  SENSORY:  Large amount of healing vesicles on the right side of back and also right flank  COORDINATION:  finger-nose-finger, fine finger movements normal  REFLEXES:  deep tendon reflexes present and symmetric  GAIT/STATION:  normal   DIAGNOSTIC DATA (LABS, IMAGING, TESTING) - I reviewed patient records, labs, notes, testing and imaging myself where available.  Lab Results  Component Value Date   WBC 6.3 04/26/2022   HGB 15.6 04/26/2022   HCT 44.5 04/26/2022   MCV 85 04/26/2022   PLT 218 04/26/2022      Component Value Date/Time   NA 143 04/26/2022 1019   K 4.1 04/26/2022 1019   CL 104 04/26/2022 1019   CO2 23 04/26/2022 1019   GLUCOSE 97 04/26/2022 1019   GLUCOSE 101 (H) 03/07/2022 0434   BUN 12 04/26/2022 1019   CREATININE 1.22 04/26/2022 1019   CREATININE 1.10 10/30/2013 1138   CALCIUM 9.9 04/26/2022 1019   PROT 6.7 03/07/2022 0434   PROT 7.0 03/02/2022 0924   ALBUMIN 3.1 (L) 03/07/2022 0434   ALBUMIN 4.0 03/02/2022 0924   AST 24 03/07/2022 0434   ALT 30 03/07/2022 0434   ALKPHOS 58 03/07/2022 0434   BILITOT 0.5 03/07/2022 0434   BILITOT 1.7 (H) 03/02/2022 0924   GFRNONAA >60 03/07/2022 0434   GFRNONAA 80 10/30/2013 1138   GFRAA >60 06/02/2019 1145   GFRAA >89 10/30/2013 1138   Lab Results  Component Value Date   CHOL 222 (H) 02/05/2022   HDL 31 (L) 02/05/2022   LDLCALC 127 (H) 02/05/2022   TRIG 361 (H) 02/05/2022   Lab Results  Component Value Date   HGBA1C 5.5 02/05/2022   Lab Results  Component Value Date   VITAMINB12 367 12/31/2018   Lab Results  Component Value Date   TSH 2.010  02/05/2022    MRI Brain 03/04/22 1. No acute intracranial abnormality. 2. Focus of magnetic susceptibility effect at the paramedian undersurface of the right tentorial leaflet, likely an area of remote hemorrhage.   EEG 03/04/22 This study is within normal limits. The excessive beta activity seen in the background is most likely due to the effect of benzodiazepine and is a benign EEG pattern. No seizures or epileptiform discharges were seen throughout the recording.  EEG 05/24/22 This is a normal EEG recording in the waking and sleeping state. No evidence interictal epileptiform discharges were seen at any time during the recording.  A normal EEG does not exclude a diagnosis of epilepsy.    ASSESSMENT AND PLAN  56 y.o. 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  Patient has completed treatment for viral encephalitis but now is complaining of severe episodic headache.  Headaches have improved but he is still complaining of of post herpetic neuralgia. Will give him Pregabalin. Also will give him a sample of ZTLido.  When it comes to the seizure, he is on Keppra 500 mg BID but wife has reported additional episodes where he is unresponsive without recollection of the events. At this time, we continue Keppra but advised wife to video tape the next event and send it to me. I will also obtain a heart monitor to rule out arrhythmia or presyncopal episodes. They voice understand.  His blood pressure was elevated today, he is on Amlodipine 5 mg and HCTZ 12.5, will increase his amlodipine to 10 mg and advised patient to follow up with  PCP for better blood pressure management.    1. Post herpetic neuralgia   2. Seizure disorder ([place])   3. Essential hypertension, benign   4. History of encephalitis   5. Syncope, unspecified syncope type       Patient Instructions  Continue  Keppra 500 mg twice daily, advised wife to video record the next event Will also request a 30-day cardiac monitor to rule out arrhythmia causing these events Discontinue gabapentin and start pregabalin 75 mg twice daily, will increase to 150 mg twice daily as needed.  Also provided patient with a sample of ZTLido, he will contact us to update [place] if it is helpful Increase amlodipine to 10 mg daily and continue to follow with PCP for better blood pressure management Follow-up in 6 months or sooner if worse.   Per [facility] statutes, patients with seizures are not allowed to drive until they have been seizure-free for six months.  Other recommendations include using caution when using heavy equipment or power tools. Avoid working on ladders or at heights. Take showers instead of baths.  Do not swim alone.  Ensure the water temperature is not too high on the home water heater. Do not go swimming alone. Do not lock yourself in a room alone (i.e. bathroom). When caring for infants or small children, sit down when holding, feeding, or changing them to minimize risk of injury to the child in the event you have a seizure. Maintain good sleep hygiene. Avoid alcohol.  Also recommend adequate sleep, hydration, good diet and minimize stress.   During the Seizure  - First, ensure adequate ventilation and place patients on the floor on their left side  Loosen clothing around the neck and ensure the airway is patent. If the patient is clenching the teeth, do not force the mouth open with any object as this can cause severe damage - Remove all items from the surrounding that can be hazardous. The patient may be oblivious to what's happening and may not even know what he or she is doing. If the patient is confused and wandering, either gently guide him/her away and block access to outside areas - Reassure the individual and be comforting - Call 911. In most cases, the seizure ends before EMS arrives.  However, there are cases when seizures may last over 3 to 5 minutes. Or the individual may have developed breathing difficulties or severe injuries. If a pregnant patient or a person with diabetes develops a seizure, it is prudent to call an ambulance. - Finally, if the patient does not regain full consciousness, then call EMS. Most patients will remain confused for about 45 to 90 minutes after a seizure, so you must use judgment in calling for help. - Avoid restraints but make sure the patient is in a bed with padded side rails - Place the individual in a lateral position with the neck slightly flexed; this will help the saliva drain from the mouth and prevent the tongue from falling backward - Remove all nearby furniture and other hazards from the area - Provide verbal assurance as the individual is regaining consciousness - Provide the patient with privacy if possible - Call for help and start treatment as ordered by the caregiver   After the Seizure (Postictal Stage)  After a seizure, most patients experience confusion, fatigue, muscle pain and/or a headache. Thus, one should permit the individual to sleep. For the next few days, reassurance is essential. Being calm and helping reorient the person  is also of importance.  Most seizures are painless and end spontaneously. Seizures are not harmful to others but can lead to complications such as stress on the lungs, brain and the heart. Individuals with prior lung problems may develop labored breathing and respiratory distress.     Orders Placed This Encounter  Procedures   CARDIAC EVENT MONITOR    Meds ordered this encounter  Medications   pregabalin (LYRICA) 75 MG capsule    Sig: Take 1 capsule (75 mg total) by mouth 2 (two) times daily.    Dispense:  60 capsule    Refill:  5   amLODipine (NORVASC) 10 MG tablet    Sig: Take 1 tablet (10 mg total) by mouth daily.    Dispense:  30 tablet    Refill:  11    Return in about 6 months  (around 03/26/2023).  I have spent a total of 50 minutes dedicated to this patient today, preparing to see patient, performing a medically appropriate examination and evaluation, ordering tests and/or medications and procedures, and counseling and educating the patient/family/caregiver; independently interpreting result and communicating results to the family/patient/caregiver; and documenting clinical information in the electronic medical record.    Alric Ran, MD 09/25/2022, 2:48 PM  [facility] [address] [place], [place] [address] [phone]

## 2022-09-26 ENCOUNTER — Encounter (HOSPITAL_BASED_OUTPATIENT_CLINIC_OR_DEPARTMENT_OTHER): Payer: Self-pay

## 2022-09-26 ENCOUNTER — Other Ambulatory Visit (HOSPITAL_BASED_OUTPATIENT_CLINIC_OR_DEPARTMENT_OTHER): Payer: Self-pay

## 2022-09-27 ENCOUNTER — Telehealth: Payer: Self-pay

## 2022-09-27 NOTE — Telephone Encounter (Signed)
Pt's 6 month restriction period is set to expire 10/12/2022. We previously provided restrictions for light duty (no driving, operating heavy machinery, no work on ladders/roof). Reported seizure first part of November.   Will keep light duty recommendation at this time.

## 2022-09-28 ENCOUNTER — Ambulatory Visit: Payer: Commercial Managed Care - HMO | Attending: Neurology

## 2022-09-28 DIAGNOSIS — R55 Syncope and collapse: Secondary | ICD-10-CM | POA: Diagnosis not present

## 2022-09-30 ENCOUNTER — Other Ambulatory Visit: Payer: Commercial Managed Care - HMO

## 2022-10-04 ENCOUNTER — Telehealth: Payer: Self-pay | Admitting: *Deleted

## 2022-10-04 NOTE — Telephone Encounter (Signed)
Pt [place] form faxed on 10/01/2022

## 2022-10-07 ENCOUNTER — Other Ambulatory Visit: Payer: Commercial Managed Care - HMO

## 2022-10-08 ENCOUNTER — Encounter: Payer: Self-pay | Admitting: Neurology

## 2022-10-12 ENCOUNTER — Ambulatory Visit (HOSPITAL_BASED_OUTPATIENT_CLINIC_OR_DEPARTMENT_OTHER): Payer: Commercial Managed Care - HMO | Admitting: Orthopaedic Surgery

## 2022-10-25 ENCOUNTER — Ambulatory Visit (HOSPITAL_BASED_OUTPATIENT_CLINIC_OR_DEPARTMENT_OTHER): Payer: BC Managed Care – PPO | Admitting: Family Medicine

## 2022-10-26 ENCOUNTER — Ambulatory Visit (HOSPITAL_BASED_OUTPATIENT_CLINIC_OR_DEPARTMENT_OTHER): Payer: Commercial Managed Care - HMO | Attending: Orthopaedic Surgery | Admitting: Physical Therapy

## 2022-10-26 ENCOUNTER — Other Ambulatory Visit (HOSPITAL_BASED_OUTPATIENT_CLINIC_OR_DEPARTMENT_OTHER): Payer: Self-pay | Admitting: Family Medicine

## 2022-10-26 DIAGNOSIS — B029 Zoster without complications: Secondary | ICD-10-CM

## 2022-10-26 NOTE — Therapy (Deleted)
OUTPATIENT PHYSICAL THERAPY CERVICAL EVALUATION   Patient Name: Darrell King MRN: [id number] DOB:[birth year], 56 y.o., male Today's Date: 10/26/2022  END OF SESSION:   Past Medical History:  Diagnosis Date   Acute gout 12/31/2016   Anemia 03/15/2022   Anxiety    "had taken Paxil; back in 1996; nothing since" (10/21/2013)   Arthritis    Complication of anesthesia    SLOW TO WAKE UP AFTER SURGERY IN 2019   Dyslipidemia 03/11/2016   12.5% 10-year risk of heart disease or stroke, should be on a moderate/high intensity statin.     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   Refusal of blood transfusions as patient is Jehovah's Witness    Past Surgical History:  Procedure Laterality Date   COLONOSCOPY W/ POLYPECTOMY     INGUINAL HERNIA REPAIR Right 1995   INGUINAL HERNIA REPAIR Left 11/12/2013   Procedure: HERNIA REPAIR INGUINAL INCARCERATED;  Surgeon: Adolph Pollack, MD;  Location: [facility] OR;  Service: General;  Laterality: Left;   SHOULDER ARTHROSCOPY WITH OPEN ROTATOR CUFF REPAIR AND DISTAL CLAVICLE ACROMINECTOMY Right 06/02/2019   Procedure: right shoulder arthroscopy , mini open rotator cuff repair;  Surgeon: Cammy Copa, MD;  Location: [facility] OR;  Service: Orthopedics;  Laterality: Right;   SHOULDER ARTHROSCOPY WITH SUBACROMIAL DECOMPRESSION, ROTATOR CUFF REPAIR AND BICEP TENDON REPAIR Right 07/14/2018   Procedure: RIGHT SHOULDER ARTHROSCOPY, DEBRIDEMENT, BICEPS TENODESIS, DISTAL CLAVICLE EXCISION AND MINI OPEN ROTATOR CUFF TEAR REPAIR;  Surgeon: Cammy Copa, MD;  Location: [facility] OR;  Service: Orthopedics;  Laterality: Right;   Patient Active Problem List   Diagnosis Date Noted   Right shoulder pain 09/06/2022   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   Post herpetic neuralgia 03/21/2022   Medication monitoring encounter 03/19/2022   PICC (peripherally inserted  central catheter) in place 03/19/2022   Elevated serum creatinine 03/15/2022   Anemia 03/15/2022   Slow urinary stream 03/15/2022   Varicella encephalitis    Abdominal pain 03/02/2022   Herpes zoster 03/02/2022   New onset seizure (HCC) 03/02/2022   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   Encephalitis 03/02/2022   Family history of malignant neoplasm of digestive organ 02/05/2022   Wellness examination 02/05/2022   Left knee pain 12/20/2021   Low back pain 10/16/2021   Healthcare maintenance 10/16/2021   Dermatitis 04/19/2021   Obesity (BMI 30-39.9) 02/02/2021   Melanonychia 09/22/2019   Arthritis of right acromioclavicular joint    S/P arthroscopy of right shoulder 06/02/2019   Chronic Right Ankle Pain  12/31/2016   Dyslipidemia 03/11/2016   Essential hypertension, benign 02/23/2016   Tendinopathy of right rotator cuff 02/02/2016   Numbness and tingling of right upper extremity 11/29/2014   Patient is Jehovah's Witness 01/04/2014   Left inguinal hernia 11/11/2013    PCP: ***  REFERRING PROVIDER: ***  REFERRING DIAG: ***  THERAPY DIAG:  No diagnosis found.  Rationale for Evaluation and Treatment: {HABREHAB:27488}  ONSET DATE: ***  SUBJECTIVE:  SUBJECTIVE STATEMENT: ***  PERTINENT HISTORY:  ***  PAIN:  Are you having pain? {OPRCPAIN:27236}  PRECAUTIONS: {Therapy precautions:24002}  WEIGHT BEARING RESTRICTIONS: {Yes ***/No:24003}  FALLS:  Has patient fallen in last 6 months? {fallsyesno:27318}  LIVING ENVIRONMENT: Lives with: {OPRC lives with:25569::"lives with their family"} Lives in: {Lives in:25570} Stairs: {opstairs:27293} Has following equipment at home: {Assistive devices:23999}  OCCUPATION: ***  PLOF: {PLOF:24004}  PATIENT GOALS: ***  NEXT MD VISIT:    OBJECTIVE:   DIAGNOSTIC FINDINGS:  ***  PATIENT SURVEYS:  {rehab surveys:24030}  COGNITION: Overall cognitive status: {cognition:24006}  SENSATION: {sensation:27233}  POSTURE: {posture:25561}  PALPATION: ***   CERVICAL ROM:   {AROM/PROM:27142} ROM A/PROM (deg) eval  Flexion   Extension   Right lateral flexion   Left lateral flexion   Right rotation   Left rotation    (Blank rows = not tested)  UPPER EXTREMITY ROM:  {AROM/PROM:27142} ROM Right eval Left eval  Shoulder flexion    Shoulder extension    Shoulder abduction    Shoulder adduction    Shoulder extension    Shoulder internal rotation    Shoulder external rotation    Elbow flexion    Elbow extension    Wrist flexion    Wrist extension    Wrist ulnar deviation    Wrist radial deviation    Wrist pronation    Wrist supination     (Blank rows = not tested)  UPPER EXTREMITY MMT:  MMT Right eval Left eval  Shoulder flexion    Shoulder extension    Shoulder abduction    Shoulder adduction    Shoulder extension    Shoulder internal rotation    Shoulder external rotation    Middle trapezius    Lower trapezius    Elbow flexion    Elbow extension    Wrist flexion    Wrist extension    Wrist ulnar deviation    Wrist radial deviation    Wrist pronation    Wrist supination    Grip strength     (Blank rows = not tested)  CERVICAL SPECIAL TESTS:  {Cervical special tests:25246}  FUNCTIONAL TESTS:  {Functional tests:24029}  TODAY'S TREATMENT:                                                                                                                              DATE: ***   PATIENT EDUCATION:  Education details: *** Person educated: {Person educated:25204} Education method: {Education Method:25205} Education comprehension: {Education Comprehension:25206}  HOME EXERCISE PROGRAM: ***  ASSESSMENT:  CLINICAL IMPRESSION: Patient is a *** y.o. *** who was seen today for physical  therapy evaluation and treatment for ***.   OBJECTIVE IMPAIRMENTS: {opptimpairments:25111}.   ACTIVITY LIMITATIONS: {activitylimitations:27494}  PARTICIPATION LIMITATIONS: {participationrestrictions:25113}  PERSONAL FACTORS: {Personal factors:25162} are also affecting patient's functional outcome.   REHAB POTENTIAL: {rehabpotential:25112}  CLINICAL DECISION MAKING: {clinical decision making:25114}  EVALUATION COMPLEXITY: {Evaluation complexity:25115}   GOALS: Goals reviewed with patient? {yes/no:20286}  SHORT TERM GOALS: Target date: ***  *** Baseline: *** Goal status: {GOALSTATUS:25110}  2.  *** Baseline: *** Goal status: {GOALSTATUS:25110}  3.  *** Baseline: *** Goal status: {GOALSTATUS:25110}  4.  *** Baseline: *** Goal status: {GOALSTATUS:25110}  5.  *** Baseline: *** Goal status: {GOALSTATUS:25110}  6.  *** Baseline: *** Goal status: {GOALSTATUS:25110}  LONG TERM GOALS: Target date: ***  *** Baseline: *** Goal status: {GOALSTATUS:25110}  2.  *** Baseline: *** Goal status: {GOALSTATUS:25110}  3.  *** Baseline: *** Goal status: {GOALSTATUS:25110}  4.  *** Baseline: *** Goal status: {GOALSTATUS:25110}  5.  *** Baseline: *** Goal status: {GOALSTATUS:25110}  6.  *** Baseline: *** Goal status: {GOALSTATUS:25110}   PLAN:  PT FREQUENCY: {rehab frequency:25116}  PT DURATION: {rehab duration:25117}  PLANNED INTERVENTIONS: {rehab planned interventions:25118::"Therapeutic exercises","Therapeutic activity","Neuromuscular re-education","Balance training","Gait training","Patient/Family education","Self Care","Joint mobilization"}  PLAN FOR NEXT SESSION: ***   Dessie Coma, PT 10/26/2022, 3:47 PM

## 2022-12-03 ENCOUNTER — Ambulatory Visit (INDEPENDENT_AMBULATORY_CARE_PROVIDER_SITE_OTHER): Payer: Medicaid Other | Admitting: Family Medicine

## 2022-12-03 ENCOUNTER — Encounter (HOSPITAL_BASED_OUTPATIENT_CLINIC_OR_DEPARTMENT_OTHER): Payer: Self-pay | Admitting: Family Medicine

## 2022-12-03 VITALS — BP 127/98 | HR 84 | Ht 79.0 in | Wt 322.0 lb

## 2022-12-03 DIAGNOSIS — M25561 Pain in right knee: Secondary | ICD-10-CM | POA: Diagnosis not present

## 2022-12-03 DIAGNOSIS — E782 Mixed hyperlipidemia: Secondary | ICD-10-CM | POA: Diagnosis not present

## 2022-12-03 DIAGNOSIS — I10 Essential (primary) hypertension: Secondary | ICD-10-CM | POA: Diagnosis not present

## 2022-12-03 HISTORY — DX: Mixed hyperlipidemia: E78.2

## 2022-12-03 LAB — COMPREHENSIVE METABOLIC PANEL
ALT: 37 IU/L (ref 0–44)
AST: 30 IU/L (ref 0–40)
Albumin/Globulin Ratio: 1.5 (ref 1.2–2.2)
Albumin: 4.6 g/dL (ref 3.8–4.9)
Alkaline Phosphatase: 78 IU/L (ref 44–121)
BUN/Creatinine Ratio: 13 (ref 9–20)
BUN: 13 mg/dL (ref 6–24)
Bilirubin Total: 0.5 mg/dL (ref 0.0–1.2)
CO2: 20 mmol/L (ref 20–29)
Calcium: 9.7 mg/dL (ref 8.7–10.2)
Chloride: 105 mmol/L (ref 96–106)
Creatinine, Ser: 0.99 mg/dL (ref 0.76–1.27)
Globulin, Total: 3 g/dL (ref 1.5–4.5)
Glucose: 93 mg/dL (ref 70–99)
Potassium: 3.8 mmol/L (ref 3.5–5.2)
Sodium: 144 mmol/L (ref 134–144)
Total Protein: 7.6 g/dL (ref 6.0–8.5)
eGFR: 90 mL/min/{1.73_m2} (ref >59)

## 2022-12-03 MED ORDER — ROSUVASTATIN CALCIUM 5 MG PO TABS: 5.0000 mg | ORAL_TABLET | Freq: Every day | ORAL | 1 refills | Status: DC

## 2022-12-03 MED ORDER — HYDROCHLOROTHIAZIDE 12.5 MG PO TABS: 12.5000 mg | ORAL_TABLET | Freq: Every day | ORAL | 1 refills | Status: DC

## 2022-12-03 MED ORDER — AMLODIPINE BESYLATE 10 MG PO TABS: 10.0000 mg | ORAL_TABLET | Freq: Every day | ORAL | 1 refills | Status: AC

## 2022-12-03 NOTE — Assessment & Plan Note (Addendum)
Right knee pain and swelling present for 3 weeks with decreased ROM.  Denies unusual activity, or injury.  There is obvious swelling over patella, tenderness with palpation.  He is in the care of of a orthopedic specialist currently, he will notify the specialist for an appointment for further evaluation/treatment.  Recommend RICE while awaiting specialist appointment.

## 2022-12-03 NOTE — Assessment & Plan Note (Addendum)
Taking amlodipine 10 mg and hydrochlorothiazide 12.5 mg daily as prescribed.  He denies chest pain, shortness of breath, lower extremity edema, vision changes and headaches.  We will get complete metabolic panel today.  He has not been monitoring his blood pressure at home, he has a cuff, he is willing to start doing this.  Tolerating medication well.  Continue amlodipine 10 mg and hydrochlorothiazide 12.5 mg daily.  Blood pressure not at goal in office today, discussed proper technique for monitoring at home, recommend keeping a log.  Reports that he has not taken his blood pressure medications today.  Refills sent.

## 2022-12-03 NOTE — Progress Notes (Signed)
Established Patient Office Visit  Subjective   Patient ID: Darrell King, male    DOB: 1966/10/04  Age: 57 y.o. MRN: [id number]  Chief Complaint  Patient presents with   Follow-up    Pt here for f/u on HTN, pt stated his only concern is his right knee has been swelling on and off for about three weeks now     HPI  Hypertension Medication compliance: amlodipine 10 mg daily (has not taken today), HCTZ 12.5 mg QD Denies chest pain, shortness of breath, lower extremity edema, vision changes, headaches.  Pertinent lab work: will get CMP today, last BMP done 04/23/22, GFR 70.  Monitoring at home: has not been monitoring at home, has cuff at home.  Tolerating medication well: no side effects reported Continue current medication regimen: amlodipine 10 mg and HCTZ 12.5 mg  Follow-up: 3 months  Knee pain:  Knee pain and swelling for 3 weeks, denies new activities, no injury. Ibuprofen took edge off.  Has history of joint pain in past.  Has ortho specialist.     Review of Systems  Eyes:  Negative for blurred vision and double vision.  Respiratory:  Negative for shortness of breath.   Cardiovascular:  Negative for chest pain.  Musculoskeletal:  Positive for joint pain (right knee pain). Negative for falls.  Neurological:  Negative for dizziness and headaches.      Objective:     BP (!) 127/98 (BP Location: Right Arm, Patient Position: Sitting, Cuff Size: Large)   Pulse 84   Ht 6\' 7"  (2.007 m)   Wt (!) 322 lb (146.1 kg)   SpO2 100%   BMI 36.27 kg/m  BP Readings from Last 3 Encounters:  12/03/22 (!) 127/98  09/25/22 (!) 160/106  09/06/22 (!) 143/87      Physical Exam Vitals and nursing note reviewed.  Constitutional:      General: He is not in acute distress.    Appearance: Normal appearance.  Cardiovascular:     Rate and Rhythm: Normal rate and regular rhythm.     Heart sounds: Normal heart sounds.  Pulmonary:     Effort: Pulmonary effort is normal.     Breath  sounds: Normal breath sounds.  Musculoskeletal:        General: Swelling and tenderness present. No signs of injury.     Right lower leg: No edema.     Left lower leg: No edema.     Comments: Right knee. No calf pain.   Skin:    General: Skin is warm and dry.     Capillary Refill: Capillary refill takes less than 2 seconds.  Neurological:     General: No focal deficit present.     Mental Status: Mental status is at baseline.  Psychiatric:        Mood and Affect: Mood normal.        Behavior: Behavior normal.        Thought Content: Thought content normal.        Judgment: Judgment normal.     No results found for any visits on 12/03/22.  Last metabolic panel Lab Results  Component Value Date   GLUCOSE 97 04/26/2022   NA 143 04/26/2022   K 4.1 04/26/2022   CL 104 04/26/2022   CO2 23 04/26/2022   BUN 12 04/26/2022   CREATININE 1.22 04/26/2022   EGFR 70 04/26/2022   CALCIUM 9.9 04/26/2022   PROT 6.7 03/07/2022   ALBUMIN 3.1 (L) 03/07/2022  LABGLOB 3.0 03/02/2022   AGRATIO 1.3 03/02/2022   BILITOT 0.5 03/07/2022   ALKPHOS 58 03/07/2022   AST 24 03/07/2022   ALT 30 03/07/2022   ANIONGAP 5 03/07/2022      The 10-year ASCVD risk score (Arnett DK, et al., 2019) is: 12.8%    Assessment & Plan:   Problem List Items Addressed This Visit     Essential hypertension, benign - Primary    Taking amlodipine 10 mg and hydrochlorothiazide 12.5 mg daily as prescribed.  He denies chest pain, shortness of breath, lower extremity edema, vision changes and headaches.  We will get complete metabolic panel today.  He has not been monitoring his blood pressure at home, he has a cuff, he is willing to start doing this.  Tolerating medication well.  Continue amlodipine 10 mg and hydrochlorothiazide 12.5 mg daily.  Blood pressure not at goal in office today, discussed proper technique for monitoring at home, recommend keeping a log.  Reports that he has not taken his blood pressure  medications today.  Refills sent.      Relevant Medications   amLODipine (NORVASC) 10 MG tablet   hydrochlorothiazide (HYDRODIURIL) 12.5 MG tablet   rosuvastatin (CRESTOR) 5 MG tablet   Other Relevant Orders   Comprehensive metabolic panel   Acute pain of right knee    Right knee pain and swelling present for 3 weeks with decreased ROM.  Denies unusual activity, or injury.  There is obvious swelling over patella, tenderness with palpation.  He is in the care of of a orthopedic specialist currently, he will notify the specialist for an appointment for further evaluation/treatment.  Recommend RICE while awaiting specialist appointment.       Mixed hyperlipidemia   Relevant Medications   amLODipine (NORVASC) 10 MG tablet   hydrochlorothiazide (HYDRODIURIL) 12.5 MG tablet   rosuvastatin (CRESTOR) 5 MG tablet  Agrees with plan of care discussed.  Questions answered. Refills sent as requested.  Follow-up with PCP in 3 months.  Keep blood pressure log at home. DASH diet.  Moderate exercise as tolerated.    Return in about 3 months (around 03/03/2023) for htn.    Chalmers Guest, FNP

## 2022-12-07 ENCOUNTER — Ambulatory Visit
Admission: RE | Admit: 2022-12-07 | Discharge: 2022-12-07 | Disposition: A | Discharge status: Home / Self Care | Payer: Medicaid Other | Source: Ambulatory Visit | Attending: Orthopaedic Surgery | Admitting: Orthopaedic Surgery

## 2022-12-07 ENCOUNTER — Ambulatory Visit (HOSPITAL_BASED_OUTPATIENT_CLINIC_OR_DEPARTMENT_OTHER): Payer: BC Managed Care – PPO | Admitting: Family Medicine

## 2022-12-07 ENCOUNTER — Telehealth (HOSPITAL_BASED_OUTPATIENT_CLINIC_OR_DEPARTMENT_OTHER): Payer: Self-pay | Admitting: Family Medicine

## 2022-12-07 DIAGNOSIS — M5412 Radiculopathy, cervical region: Secondary | ICD-10-CM

## 2022-12-07 NOTE — Telephone Encounter (Signed)
Please reconcile the Chart for the Flu Vaccine

## 2022-12-13 ENCOUNTER — Ambulatory Visit (HOSPITAL_BASED_OUTPATIENT_CLINIC_OR_DEPARTMENT_OTHER): Payer: Medicaid Other | Admitting: Orthopaedic Surgery

## 2023-01-20 ENCOUNTER — Other Ambulatory Visit: Payer: Self-pay | Admitting: Neurology

## 2023-01-25 ENCOUNTER — Encounter: Payer: Self-pay | Admitting: Neurology

## 2023-01-28 ENCOUNTER — Other Ambulatory Visit: Payer: Self-pay | Admitting: Neurology

## 2023-01-28 MED ORDER — LEVETIRACETAM 750 MG PO TABS: 750.0000 mg | ORAL_TABLET | Freq: Two times a day (BID) | ORAL | 3 refills | Status: DC

## 2023-04-01 ENCOUNTER — Encounter: Payer: Self-pay | Admitting: Neurology

## 2023-04-01 ENCOUNTER — Ambulatory Visit: Payer: Medicaid Other | Admitting: Neurology

## 2023-04-01 VITALS — BP 118/66 | Ht >= 80 in | Wt 316.0 lb

## 2023-04-01 DIAGNOSIS — R569 Unspecified convulsions: Secondary | ICD-10-CM | POA: Diagnosis not present

## 2023-04-01 DIAGNOSIS — G43809 Other migraine, not intractable, without status migrainosus: Secondary | ICD-10-CM | POA: Diagnosis not present

## 2023-04-01 DIAGNOSIS — B0229 Other postherpetic nervous system involvement: Secondary | ICD-10-CM | POA: Diagnosis not present

## 2023-04-01 MED ORDER — TOPIRAMATE 50 MG PO TABS: 50.0000 mg | ORAL_TABLET | Freq: Every evening | ORAL | 6 refills | Status: DC

## 2023-04-01 MED ORDER — RIZATRIPTAN BENZOATE 10 MG PO TBDP: 10.0000 mg | ORAL_TABLET | ORAL | 6 refills | Status: DC | PRN

## 2023-04-01 NOTE — Progress Notes (Signed)
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  Last visit was in November and since then wife reports he has 2 additional events.  He is compliant with his Keppra 750 mg twice daily but events are still occurring.  Wife reports that both events were preceded by severe headache pain then patient will feel weak and complains of dizziness.  The entire episode would last about an hour and then he will  be back to his normal self.  There are no report of convulsion, no tongue biting or urinary incontinence.  During this time also he is aware and able to follow direction.  One event happened at home when he was playing card and he was able to get up and sit on the couch with the help of family member and the other event happened while he was in the grocery store.  He was able to lean over the cart and walk to his car before resting there. In term of his post herpetic Neuralgia, he continues to experience occasional pain but wife reports no active infection, no vesicle formation.    INTERVAL HISTORY 09/25/22:  Patient presents today for follow-up, he is accompanied by wife.  At last visit in July, we had plan to repeat his MRI/MRA  but he lost his insurance and was not able to complete the tests. He reports the shingle pain is still present.  Currently he is on gabapentin 600 mg 3 times a day.  Due to side effect he is interested in coming down on the dose. When he comes to the  seizures, wife reported 3 events where he is unresponsive and have mild tremor.  The last event was 3 weeks ago when he was holding his grandchild.  Patient does not remember these events.  She denies any generalized tonic-clonic seizure like the one that happened in the hospital. He is still out of work due to ongoing symptoms.    INTERVAL HISTORY 05/15/22: Patient presents today for follow-up, he is accompanied by his wife.  Since last visit in May, he has been experiencing episodes of severe headaches and unresponsiveness.  He reported during these episodes, he can feel heat coming under his feet all the way up to his head followed by severe headache then he will be unconscious; this can last for 15 minutes up to 45 minutes.  During this time wife reported she is trying to talk to him but he is unresponsive or he will mumble.  After each episode, patient does not remember the episode at all.  In total he has happened 3 times.  They have not been to the hospital after each episode.  Feels heat coming up and feels very weak, feeling dizzy. He denies any other symptoms associated with the headaches. He still experiences the post herpetic neuralgia.    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  Patient reports presenting to his primary care doctor on May 5, for shingle, at that time they were drawing blood and patient starts complaining of not feeling well, he was on the wheelchair, wife noted that patient eyes rolled back with a blank stare and he went limp.  There was report patient being confused after the events.  Because of the seizure-like activity he was referred to [facility] health for admission.  His MRI did not show any acute abnormalities EEG was normal, his LP was positive for  VZV.  He was started on Keppra and antiviral.  Since discharge from the hospital he is complaining of headache and also post herpetic neuralgia.  He denies any additional seizures and denies any seizure risk factors.  He is compliant with the Keppra medication and denies any side effects.     Hospital summary  Patient is a 57 y.o.  male with history of HTN, HLD, aseptic meningitis in 2014-who developed shingles in his right flank area of his torso-started on Valtrex 3 days prior to this hospitalization-subsequently developed fever 1 day prior to this hospitalization-he was at his PCPs office when he was noted to have a generalized tonic-clonic seizure.  He was subsequently sent to the emergency room-and admitted to [facility] service.   Varicella-zoster encephalitis/meningitis: Headache, neck stiffness and seizures, also had active shingles in his right T7 dermatome.  Rash has dried up has been placed on IV acyclovir started on 03/02/2022 of [redacted] weeks along with with gentle IV fluids, stable EEG and MRI.  CSF serology positive for VZV, PICC line was placed on 03/06/2022 he was acyclovir stop date is 03/16/2022, he was seen by neurology along with ID and IR.  Headaches much better flank discomfort much improved, no further seizure activity. Currently on Keppra as well.  Will follow outpatient with neurology and ID postdischarge along with PCP.   Right T7 dermatome shingles: On IV acyclovir-on gabapentin for neuropathic pain.  Overall improving.  Handedness: Right handed   Onset: 03/02/22  Seizure Type: Unclear, possibly generalized  Current frequency: Only once  Any injuries from seizures: Denies  Seizure risk factors: Viral encephalitis  Previous ASMs: Levetiracetam   Currenty ASMs: Topiramate for migraines prevention   ASMs side effects: Denies  Brain Images: Normal MRI brain  Previous EEGs: Normal EEG   OTHER MEDICAL CONDITIONS: Hypertension, Hyperlipidemia, Gout   REVIEW OF SYSTEMS: Full 14  system review of systems performed and negative with exception of: As noted in the HPI  ALLERGIES: No Known Allergies  HOME MEDICATIONS: Outpatient Medications Prior to Visit  Medication Sig Dispense Refill   acetaminophen (TYLENOL) 500 MG tablet Take 2 tablets (1,000 mg total) by mouth every 6 (six) hours as needed for mild pain. 90 tablet 1   amLODipine (NORVASC) 10 MG tablet Take 1 tablet (10 mg total) by mouth daily. 90 tablet 1   butalbital-acetaminophen-caffeine (FIORICET) 50-325-40 MG tablet Take 1 tablet by mouth every 6 (six) hours as needed for headache. 30 tablet 0   clonazePAM (KLONOPIN) 0.5 MG tablet SMARTSIG:1 Tablet(s) By Mouth     cyclobenzaprine (FLEXERIL) 10 MG tablet TAKE 1 TABLET BY MOUTH THREE TIMES A DAY AS NEEDED FOR MUSCLE SPASMS 90 tablet 1   hydrochlorothiazide (HYDRODIURIL) 12.5 MG tablet Take 1 tablet (12.5 mg total) by mouth daily. 90 tablet 1   rosuvastatin (CRESTOR) 5 MG tablet Take 1  tablet (5 mg total) by mouth daily. 90 tablet 1   levETIRAcetam (KEPPRA) 750 MG tablet Take 1 tablet (750 mg total) by mouth 2 (two) times daily. 180 tablet 3   diclofenac Sodium (VOLTAREN) 1 % GEL Apply 4 g topically 4 (four) times daily. (Patient not taking: Reported on 04/01/2023) 350 g 1   gabapentin (NEURONTIN) 300 MG capsule TAKE 1 CAPSULE BY MOUTH THREE TIMES A DAY 60 capsule 0   pregabalin (LYRICA) 75 MG capsule Take 1 capsule (75 mg total) by mouth 2 (two) times daily. 60 capsule 5   No facility-administered medications prior to visit.    PAST MEDICAL HISTORY: Past Medical History:  Diagnosis Date   Acute gout 12/31/2016   Anemia 03/15/2022   Anxiety    "had taken Paxil; back in 1996; nothing since" (10/21/2013)   Arthritis    Complication of anesthesia    SLOW TO WAKE UP AFTER SURGERY IN 2019   Dyslipidemia 03/11/2016   12.5% 10-year risk of heart disease or stroke, should be on a moderate/high intensity statin.     Encounter for hepatitis C screening test for low  risk patient 02/02/2021   Essential hypertension, benign 02/23/2016   Mixed hyperlipidemia 12/03/2022   New onset seizure (HCC) 03/02/2022   Refusal of blood transfusions as patient is Jehovah's Witness     PAST SURGICAL HISTORY: Past Surgical History:  Procedure Laterality Date   COLONOSCOPY W/ POLYPECTOMY     INGUINAL HERNIA REPAIR Right 1995   INGUINAL HERNIA REPAIR Left 11/12/2013   Procedure: HERNIA REPAIR INGUINAL INCARCERATED;  Surgeon: Adolph Pollack, MD;  Location: [facility] OR;  Service: General;  Laterality: Left;   SHOULDER ARTHROSCOPY WITH OPEN ROTATOR CUFF REPAIR AND DISTAL CLAVICLE ACROMINECTOMY Right 06/02/2019   Procedure: right shoulder arthroscopy , mini open rotator cuff repair;  Surgeon: Cammy Copa, MD;  Location: [facility] OR;  Service: Orthopedics;  Laterality: Right;   SHOULDER ARTHROSCOPY WITH SUBACROMIAL DECOMPRESSION, ROTATOR CUFF REPAIR AND BICEP TENDON REPAIR Right 07/14/2018   Procedure: RIGHT SHOULDER ARTHROSCOPY, DEBRIDEMENT, BICEPS TENODESIS, DISTAL CLAVICLE EXCISION AND MINI OPEN ROTATOR CUFF TEAR REPAIR;  Surgeon: Cammy Copa, MD;  Location: [facility] OR;  Service: Orthopedics;  Laterality: Right;    FAMILY HISTORY: Family History  Problem Relation Age of Onset   Colon cancer Mother        colon   Cancer Mother    Cancer Sister        dont know what type    SOCIAL HISTORY: Social History   Socioeconomic History   Marital status: Married    Spouse name: Not on file   Number of children: 3   Years of education: Not on file   Highest education level: Not on file  Occupational History   Occupation: Maintenance Tech  Tobacco Use   Smoking status: Never   Smokeless tobacco: Never  Vaping Use   Vaping Use: Never used  Substance and Sexual Activity   Alcohol use: Not Currently    Alcohol/week: 1.0 - 2.0 standard drink of alcohol    Types: 1 - 2 Shots of liquor per week    Comment: occasionally.   Drug use: No   Sexual activity: Yes    Birth  control/protection: None  Other Topics Concern   Not on file  Social History Narrative   Enjoys bowling   Works as a Curator   Lives with wife   Right handed   Social Determinants of Corporate investment banker  Strain: Not on file  Food Insecurity: Not on file  Transportation Needs: Not on file  Physical Activity: Not on file  Stress: Not on file  Social Connections: Not on file  Intimate Partner Violence: Not on file    PHYSICAL EXAM  GENERAL EXAM/CONSTITUTIONAL: Vitals:  Vitals:   04/01/23 1020  BP: 118/66  Weight: (!) 316 lb (143.3 kg)  Height: 6\' 8"  (2.032 m)    Body mass index is 34.71 kg/m. Wt Readings from Last 3 Encounters:  04/01/23 (!) 316 lb (143.3 kg)  12/03/22 (!) 322 lb (146.1 kg)  09/25/22 (!) 328 lb (148.8 kg)   Patient is in no distress; well developed, nourished and groomed; neck is supple  MUSCULOSKELETAL: Gait, strength, tone, movements noted in Neurologic exam below  NEUROLOGIC: MENTAL STATUS:      No data to display         awake, alert, oriented to person, place and time recent and remote memory intact normal attention and concentration language fluent, comprehension intact, naming intact fund of knowledge appropriate  CRANIAL NERVE:  2nd, 3rd, 4th, 6th - visual fields full to confrontation, extraocular muscles intact, no nystagmus 5th - facial sensation symmetric 7th - facial strength symmetric 8th - hearing intact 9th - palate elevates symmetrically, uvula midline 11th - shoulder shrug symmetric 12th - tongue protrusion midline  MOTOR:  normal bulk and tone, full strength in the BUE, BLE  SENSORY:  Normal sensation  COORDINATION:  finger-nose-finger, fine finger movements normal  REFLEXES:  deep tendon reflexes present and symmetric  GAIT/STATION:  normal   DIAGNOSTIC DATA (LABS, IMAGING, TESTING) - I reviewed patient records, labs, notes, testing and imaging myself where available.  Lab Results  Component  Value Date   WBC 6.3 04/26/2022   HGB 15.6 04/26/2022   HCT 44.5 04/26/2022   MCV 85 04/26/2022   PLT 218 04/26/2022      Component Value Date/Time   NA 144 12/03/2022 0940   K 3.8 12/03/2022 0940   CL 105 12/03/2022 0940   CO2 20 12/03/2022 0940   GLUCOSE 93 12/03/2022 0940   GLUCOSE 101 (H) 03/07/2022 0434   BUN 13 12/03/2022 0940   CREATININE 0.99 12/03/2022 0940   CREATININE 1.10 10/30/2013 1138   CALCIUM 9.7 12/03/2022 0940   PROT 7.6 12/03/2022 0940   ALBUMIN 4.6 12/03/2022 0940   AST 30 12/03/2022 0940   ALT 37 12/03/2022 0940   ALKPHOS 78 12/03/2022 0940   BILITOT 0.5 12/03/2022 0940   GFRNONAA >60 03/07/2022 0434   GFRNONAA 80 10/30/2013 1138   GFRAA >60 06/02/2019 1145   GFRAA >89 10/30/2013 1138   Lab Results  Component Value Date   CHOL 222 (H) 02/05/2022   HDL 31 (L) 02/05/2022   LDLCALC 127 (H) 02/05/2022   TRIG 361 (H) 02/05/2022   Lab Results  Component Value Date   HGBA1C 5.5 02/05/2022   Lab Results  Component Value Date   VITAMINB12 367 12/31/2018   Lab Results  Component Value Date   TSH 2.010 02/05/2022    MRI Brain 03/04/22 1. No acute intracranial abnormality. 2. Focus of magnetic susceptibility effect at the paramedian undersurface of the right tentorial leaflet, likely an area of remote hemorrhage.   EEG 03/04/22 This study is within normal limits. The excessive beta activity seen in the background is most likely due to the effect of benzodiazepine and is a benign EEG pattern. No seizures or epileptiform discharges were seen throughout the recording.  EEG  05/24/22 This is a normal EEG recording in the waking and sleeping state. No evidence interictal epileptiform discharges were seen at any time during the recording.  A normal EEG does not exclude a diagnosis of epilepsy.    ASSESSMENT AND PLAN  57 y.o. year old male  with hypertension, hyperlipidemia, obesity, gout, history of viral encephalitis and herpes Zoster re-activation who  is presenting for follow up.   In terms of post herpetic neuralgia, we will continue him on pregabalin.  He is doing well but on occasion will have sharp pain. 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 Will discontinue Keppra and start the patient on topiramate as preventive medication and rizatriptan as abortive medication.  He does report a family history of migraine in her sister.  I will see him in 3 months for follow-up.  Advised wife to contact me if he has other events. They are comfortable with plans.    1. Vestibular migraine   2. Post herpetic neuralgia   3. Seizure-like activity (HCC)       Patient Instructions  Decrease Levetiracetam to 750 mg nightly for one week, then further decrease to half tablet nightly for one week, then discontinue the medication  Start Topiramate 50 mg, 1/2 tablet nightly for one week then increase to full tablet Use Rizatriptan as soon as you experience the first signs, including feeling warmth Continue your other medications  Return in 3 months or sooner if worse    Per [facility] statutes, patients with seizures are not allowed to drive until they have been seizure-free for six months.  Other recommendations include using caution when using heavy equipment or power tools. Avoid working on ladders or at heights. Take showers instead of baths.  Do not swim alone.  Ensure the water temperature is not too high on the home water heater. Do not go swimming alone. Do not lock yourself in a room alone (i.e. bathroom). When caring for infants or small children, sit down when holding, feeding, or changing them to minimize risk of injury to the child in the event you have a seizure. Maintain good sleep hygiene. Avoid alcohol.  Also recommend adequate sleep, hydration, good diet and minimize stress.   During the Seizure  - First, ensure adequate ventilation and  place patients on the floor on their left side  Loosen clothing around the neck and ensure the airway is patent. If the patient is clenching the teeth, do not force the mouth open with any object as this can cause severe damage - Remove all items from the surrounding that can be hazardous. The patient may be oblivious to what's happening and may not even know what he or she is doing. If the patient is confused and wandering, either gently guide him/her away and block access to outside areas - Reassure the individual and be comforting - Call 911. In most cases, the seizure ends before EMS arrives. However, there are cases when seizures may last over 3 to 5 minutes. Or the individual may have developed breathing difficulties or severe injuries. If a pregnant patient or a person with diabetes develops a seizure, it is prudent to call an ambulance. - Finally, if the patient does not regain full consciousness, then call EMS. Most patients will remain confused for about 45 to 90 minutes after a seizure, so you must use judgment in calling for help. - Avoid restraints but  make sure the patient is in a bed with padded side rails - Place the individual in a lateral position with the neck slightly flexed; this will help the saliva drain from the mouth and prevent the tongue from falling backward - Remove all nearby furniture and other hazards from the area - Provide verbal assurance as the individual is regaining consciousness - Provide the patient with privacy if possible - Call for help and start treatment as ordered by the caregiver   After the Seizure (Postictal Stage)  After a seizure, most patients experience confusion, fatigue, muscle pain and/or a headache. Thus, one should permit the individual to sleep. For the next few days, reassurance is essential. Being calm and helping reorient the person is also of importance.  Most seizures are painless and end spontaneously. Seizures are not harmful to  others but can lead to complications such as stress on the lungs, brain and the heart. Individuals with prior lung problems may develop labored breathing and respiratory distress.     No orders of the defined types were placed in this encounter.   Meds ordered this encounter  Medications   topiramate (TOPAMAX) 50 MG tablet    Sig: Take 1 tablet (50 mg total) by mouth at bedtime.    Dispense:  30 tablet    Refill:  6   rizatriptan (MAXALT-MLT) 10 MG disintegrating tablet    Sig: Take 1 tablet (10 mg total) by mouth as needed for migraine. May repeat in 2 hours if needed    Dispense:  12 tablet    Refill:  6    Return in about 3 months (around 07/02/2023).    Windell Norfolk, MD 04/01/2023, 5:02 PM  [facility] [address] [place], [place] [address] [phone]

## 2023-04-01 NOTE — Patient Instructions (Addendum)
Decrease Levetiracetam to 750 mg nightly for one week, then further decrease to half tablet nightly for one week, then discontinue the medication  Start Topiramate 50 mg, 1/2 tablet nightly for one week then increase to full tablet Use Rizatriptan as soon as you experience the first signs, including feeling warmth Continue your other medications  Return in 3 months or sooner if worse

## 2023-04-03 ENCOUNTER — Telehealth: Payer: Self-pay | Admitting: *Deleted

## 2023-04-03 NOTE — Telephone Encounter (Signed)
Pt [place] able life form faxed on 04/03/2023

## 2023-06-08 ENCOUNTER — Other Ambulatory Visit (HOSPITAL_BASED_OUTPATIENT_CLINIC_OR_DEPARTMENT_OTHER): Payer: Self-pay | Admitting: Family Medicine

## 2023-06-08 DIAGNOSIS — E782 Mixed hyperlipidemia: Secondary | ICD-10-CM

## 2023-07-08 ENCOUNTER — Telehealth (HOSPITAL_BASED_OUTPATIENT_CLINIC_OR_DEPARTMENT_OTHER): Payer: Self-pay | Admitting: Family Medicine

## 2023-07-08 ENCOUNTER — Ambulatory Visit: Payer: Medicaid Other | Admitting: Neurology

## 2023-07-08 ENCOUNTER — Encounter: Payer: Self-pay | Admitting: Neurology

## 2023-07-08 VITALS — BP 149/89 | HR 82 | Ht 78.0 in | Wt 316.5 lb

## 2023-07-08 DIAGNOSIS — B0229 Other postherpetic nervous system involvement: Secondary | ICD-10-CM

## 2023-07-08 DIAGNOSIS — G43711 Chronic migraine without aura, intractable, with status migrainosus: Secondary | ICD-10-CM

## 2023-07-08 DIAGNOSIS — G43809 Other migraine, not intractable, without status migrainosus: Secondary | ICD-10-CM | POA: Diagnosis not present

## 2023-07-08 DIAGNOSIS — R0683 Snoring: Secondary | ICD-10-CM | POA: Diagnosis not present

## 2023-07-08 MED ORDER — NURTEC 75 MG PO TBDP: 75.0000 mg | ORAL_TABLET | ORAL | 6 refills | Status: DC

## 2023-07-08 NOTE — Telephone Encounter (Signed)
Lvm to schedule nurse visit for BP

## 2023-07-08 NOTE — Progress Notes (Unsigned)
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 Patient with wife. States headaches/migraines are constant. Takes Maxalt, not helping and topamax not helping either   INTERVAL HISTORY 07/08/2023:  Still having daily headaches, topiramate and Maxalt not helping   Neck size 19.5 inches  Fall asleep watching TV   INTERVAL HISTORY 04/01/2023:  Dawan presents today for follow-up, he is accompanied by wife.  Last visit was in November and since then wife reports he has 2 additional events.  He is compliant with his Keppra 750 mg twice daily but events are still occurring.  Wife reports that both events were preceded by severe headache pain then patient will feel weak and complains of dizziness.  The entire episode would last about an hour and then he will  be back to his normal self.  There are no report of convulsion, no tongue biting or urinary incontinence.  During this time also he is aware and able to follow direction.  One event happened at home when he was playing card and he was able to get up and sit on the couch with the help of family member and the other event happened while he was in the grocery store.  He was able to lean over the cart and walk to his car before resting there. In term of his post herpetic Neuralgia, he continues to experience occasional pain but wife reports no active infection, no vesicle formation.    INTERVAL HISTORY 09/25/22:  Patient presents today for follow-up, he is accompanied by wife.  At last visit in July, we had plan to repeat his MRI/MRA  but he lost his insurance and was not able to complete the tests. He reports the shingle pain is still present.  Currently he is on gabapentin 600 mg 3  times a day.  Due to side effect he is interested in coming down on the dose. When he comes to the seizures, wife reported 3 events where he is unresponsive and have mild tremor.  The last event was 3 weeks ago when he was holding his grandchild.  Patient does not remember these events.  She denies any generalized tonic-clonic seizure like the one that happened in the hospital. He is still out of work due to ongoing symptoms.    INTERVAL HISTORY 05/15/22: Patient presents today for follow-up, he is accompanied by his wife.  Since last visit in May, he has been experiencing episodes of severe headaches and unresponsiveness.  He reported during these episodes, he can feel heat coming under his feet all the way up to his head followed by severe headache then he will be unconscious; this can last for 15 minutes up to 45 minutes.  During this time wife reported she is trying to talk to him but he is unresponsive or he will mumble.  After each episode, patient does not remember the episode at all.  In total he has happened 3 times.  They have not been to the hospital after each episode.  Feels heat coming up and feels very weak, feeling dizzy. He denies any other symptoms associated with the headaches. He still experiences the post herpetic neuralgia.    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  Patient reports presenting to his primary care doctor on May 5, for shingle, at that time they were drawing blood and patient starts complaining of not feeling well, he was on the wheelchair, wife noted that patient eyes rolled back with a blank stare and he went limp.  There was report patient being confused after the events.  Because of the seizure-like activity he was referred to [facility] health  for admission.  His MRI did not show any acute abnormalities EEG was normal, his LP was positive for VZV.  He was started on Keppra and antiviral.  Since discharge from the hospital he is complaining of headache and also post herpetic neuralgia.  He denies any additional seizures and denies any seizure risk factors.  He is compliant with the Keppra medication and denies any side effects.     Hospital summary  Patient is a 57 y.o.  male with history of HTN, HLD, aseptic meningitis in 2014-who developed shingles in his right flank area of his torso-started on Valtrex 3 days prior to this hospitalization-subsequently developed fever 1 day prior to this hospitalization-he was at his PCPs office when he was noted to have a generalized tonic-clonic seizure.  He was subsequently sent to the emergency room-and admitted to [facility] service.   Varicella-zoster encephalitis/meningitis: Headache, neck stiffness and seizures, also had active shingles in his right T7 dermatome.  Rash has dried up has been placed on IV acyclovir started on 03/02/2022 of [redacted] weeks along with with gentle IV fluids, stable EEG and MRI.  CSF serology positive for VZV, PICC line was placed on 03/06/2022 he was acyclovir stop date is 03/16/2022, he was seen by neurology along with ID and IR.  Headaches much better flank discomfort much improved, no further seizure activity. Currently on Keppra as well.  Will follow outpatient with neurology and ID postdischarge along with PCP.   Right T7 dermatome shingles: On IV acyclovir-on gabapentin for neuropathic pain.  Overall improving.  Handedness: Right handed   Onset: 03/02/22  Seizure Type: Unclear, possibly generalized  Current frequency: Only once  Any injuries from seizures: Denies  Seizure risk factors: Viral encephalitis  Previous ASMs: Levetiracetam   Currenty ASMs: Topiramate for migraines prevention   ASMs side effects: Denies  Brain Images: Normal MRI brain  Previous EEGs: Normal  EEG   OTHER MEDICAL CONDITIONS: Hypertension, Hyperlipidemia, Gout   REVIEW OF SYSTEMS: Full 14 system review of systems performed and negative with exception of: As noted in the HPI  ALLERGIES: No Known Allergies  HOME MEDICATIONS: Outpatient Medications Prior to Visit  Medication Sig Dispense Refill   acetaminophen (TYLENOL) 500 MG tablet Take 2 tablets (1,000 mg total) by mouth every 6 (six) hours as needed for mild pain. 90 tablet 1   amLODipine (NORVASC) 10 MG tablet Take 1 tablet (10 mg total) by mouth daily. 90 tablet 1   butalbital-acetaminophen-caffeine (FIORICET) 50-325-40 MG tablet Take 1 tablet by mouth every 6 (six) hours as needed for headache. 30 tablet 0   clonazePAM (KLONOPIN) 0.5 MG tablet SMARTSIG:1 Tablet(s) By Mouth     cyclobenzaprine (FLEXERIL) 10 MG tablet TAKE 1 TABLET BY MOUTH THREE TIMES A DAY AS NEEDED FOR MUSCLE SPASMS 90 tablet 1   diclofenac Sodium (VOLTAREN) 1 % GEL Apply 4 g topically 4 (four) times daily.  350 g 1   hydrochlorothiazide (HYDRODIURIL) 12.5 MG tablet Take 1 tablet (12.5 mg total) by mouth daily. 90 tablet 1   rizatriptan (MAXALT-MLT) 10 MG disintegrating tablet Take 1 tablet (10 mg total) by mouth as needed for migraine. May repeat in 2 hours if needed 12 tablet 6   rosuvastatin (CRESTOR) 5 MG tablet TAKE 1 TABLET (5 MG TOTAL) BY MOUTH DAILY. 90 tablet 1   topiramate (TOPAMAX) 50 MG tablet Take 1 tablet (50 mg total) by mouth at bedtime. 30 tablet 6   gabapentin (NEURONTIN) 300 MG capsule TAKE 1 CAPSULE BY MOUTH THREE TIMES A DAY 60 capsule 0   pregabalin (LYRICA) 75 MG capsule Take 1 capsule (75 mg total) by mouth 2 (two) times daily. 60 capsule 5   No facility-administered medications prior to visit.    PAST MEDICAL HISTORY: Past Medical History:  Diagnosis Date   Acute gout 12/31/2016   Anemia 03/15/2022   Anxiety    "had taken Paxil; back in 1996; nothing since" (10/21/2013)   Arthritis    Complication of anesthesia    SLOW TO  WAKE UP AFTER SURGERY IN 2019   Dyslipidemia 03/11/2016   12.5% 10-year risk of heart disease or stroke, should be on a moderate/high intensity statin.     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   Refusal of blood transfusions as patient is Jehovah's Witness     PAST SURGICAL HISTORY: Past Surgical History:  Procedure Laterality Date   COLONOSCOPY W/ POLYPECTOMY     INGUINAL HERNIA REPAIR Right 1995   INGUINAL HERNIA REPAIR Left 11/12/2013   Procedure: HERNIA REPAIR INGUINAL INCARCERATED;  Surgeon: Adolph Pollack, MD;  Location: [facility] OR;  Service: General;  Laterality: Left;   SHOULDER ARTHROSCOPY WITH OPEN ROTATOR CUFF REPAIR AND DISTAL CLAVICLE ACROMINECTOMY Right 06/02/2019   Procedure: right shoulder arthroscopy , mini open rotator cuff repair;  Surgeon: Cammy Copa, MD;  Location: [facility] OR;  Service: Orthopedics;  Laterality: Right;   SHOULDER ARTHROSCOPY WITH SUBACROMIAL DECOMPRESSION, ROTATOR CUFF REPAIR AND BICEP TENDON REPAIR Right 07/14/2018   Procedure: RIGHT SHOULDER ARTHROSCOPY, DEBRIDEMENT, BICEPS TENODESIS, DISTAL CLAVICLE EXCISION AND MINI OPEN ROTATOR CUFF TEAR REPAIR;  Surgeon: Cammy Copa, MD;  Location: [facility] OR;  Service: Orthopedics;  Laterality: Right;    FAMILY HISTORY: Family History  Problem Relation Age of Onset   Colon cancer Mother        colon   Cancer Mother    Cancer Sister        dont know what type    SOCIAL HISTORY: Social History   Socioeconomic History   Marital status: Married    Spouse name: Not on file   Number of children: 3   Years of education: Not on file   Highest education level: Not on file  Occupational History   Occupation: Maintenance Tech  Tobacco Use   Smoking status: Never   Smokeless tobacco: Never  Vaping Use   Vaping status: Never Used  Substance and Sexual  Activity   Alcohol use: Not Currently    Alcohol/week: 1.0 - 2.0 standard drink of alcohol    Types: 1 - 2 Shots of liquor per week    Comment: occasionally.   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 Wt Readings from Last 3 Encounters:  07/08/23 (!) 316 lb 8 oz (143.6 kg)  04/01/23 (!) 316 lb (143.3 kg)  12/03/22 (!) 322 lb (146.1 kg)   Patient is in no distress; well developed, nourished and groomed; neck is supple  MUSCULOSKELETAL: Gait, strength, tone, movements noted in Neurologic exam below  NEUROLOGIC: MENTAL STATUS:      No data to display         awake, alert, oriented to person, place and time recent and remote memory intact normal attention and concentration language fluent, comprehension intact, naming intact fund of knowledge appropriate  CRANIAL NERVE:  2nd, 3rd, 4th, 6th - visual fields full to confrontation, extraocular muscles intact, no nystagmus 5th - facial sensation symmetric 7th - facial strength symmetric 8th - hearing intact 9th - palate elevates symmetrically, uvula midline 11th - shoulder shrug symmetric 12th - tongue protrusion midline  MOTOR:  normal bulk and tone, full strength in the BUE, BLE  SENSORY:  Normal sensation  COORDINATION:  finger-nose-finger, fine finger movements  normal  REFLEXES:  deep tendon reflexes present and symmetric  GAIT/STATION:  normal   DIAGNOSTIC DATA (LABS, IMAGING, TESTING) - I reviewed patient records, labs, notes, testing and imaging myself where available.  Lab Results  Component Value Date   WBC 6.3 04/26/2022   HGB 15.6 04/26/2022   HCT 44.5 04/26/2022   MCV 85 04/26/2022   PLT 218 04/26/2022      Component Value Date/Time   NA 144 12/03/2022 0940   K 3.8 12/03/2022 0940   CL 105 12/03/2022 0940   CO2 20 12/03/2022 0940   GLUCOSE 93 12/03/2022 0940   GLUCOSE 101 (H) 03/07/2022 0434   BUN 13 12/03/2022 0940   CREATININE 0.99 12/03/2022 0940   CREATININE 1.10 10/30/2013 1138   CALCIUM 9.7 12/03/2022 0940   PROT 7.6 12/03/2022 0940   ALBUMIN 4.6 12/03/2022 0940   AST 30 12/03/2022 0940   ALT 37 12/03/2022 0940   ALKPHOS 78 12/03/2022 0940   BILITOT 0.5 12/03/2022 0940   GFRNONAA >60 03/07/2022 0434   GFRNONAA 80 10/30/2013 1138   GFRAA >60 06/02/2019 1145   GFRAA >89 10/30/2013 1138   Lab Results  Component Value Date   CHOL 222 (H) 02/05/2022   HDL 31 (L) 02/05/2022   LDLCALC 127 (H) 02/05/2022   TRIG 361 (H) 02/05/2022   Lab Results  Component Value Date   HGBA1C 5.5 02/05/2022   Lab Results  Component Value Date   VITAMINB12 367 12/31/2018   Lab Results  Component Value Date   TSH 2.010 02/05/2022    MRI Brain 03/04/22 1. No acute intracranial abnormality. 2. Focus of magnetic susceptibility effect at the paramedian undersurface of the right tentorial leaflet, likely an area of remote hemorrhage.   EEG  03/04/22 This study is within normal limits. The excessive beta activity seen in the background is most likely due to the effect of benzodiazepine and is a benign EEG pattern. No seizures or epileptiform discharges were seen throughout the recording.  EEG 05/24/22 This is a normal EEG recording in the waking and sleeping state. No evidence interictal epileptiform discharges were seen at any  time during the recording.  A normal EEG does not exclude a diagnosis of epilepsy.    ASSESSMENT AND PLAN  57 y.o. year old male  with hypertension, hyperlipidemia, obesity, gout, history of viral encephalitis and herpes Zoster re-activation who is presenting for follow up.   In terms of post herpetic neuralgia, we will continue him on pregabalin.  He is doing well but on occasion will have sharp pain. 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 Will discontinue Keppra and start the patient on topiramate as preventive medication and rizatriptan as abortive medication.  He does report a family history of migraine in her sister.  I will see him in 3 months for follow-up.  Advised wife to contact me if he has other events. They are comfortable with plans.    No diagnosis found.     There are no Patient Instructions on file for this visit.   Per [facility] statutes, patients with seizures are not allowed to drive until they have been seizure-free for six months.  Other recommendations include using caution when using heavy equipment or power tools. Avoid working on ladders or at heights. Take showers instead of baths.  Do not swim alone.  Ensure the water temperature is not too high on the home water heater. Do not go swimming alone. Do not lock yourself in a room alone (i.e. bathroom). When caring for infants or small children, sit down when holding, feeding, or changing them to minimize risk of injury to the child in the event you have a seizure. Maintain good sleep hygiene. Avoid alcohol.  Also recommend adequate sleep, hydration, good diet and minimize stress.   During the Seizure  - First, ensure adequate ventilation and place patients on the floor on their left side  Loosen clothing around the neck and ensure the airway is patent. If the patient is clenching the teeth, do not force the mouth  open with any object as this can cause severe damage - Remove all items from the surrounding that can be hazardous. The patient may be oblivious to what's happening and may not even know what he or she is doing. If the patient is confused and wandering, either gently guide him/her away and block access to outside areas - Reassure the individual and be comforting - Call 911. In most cases, the seizure ends before EMS arrives. However, there are cases when seizures may last over 3 to 5 minutes. Or the individual may have developed breathing difficulties or severe injuries. If a pregnant patient or a person with diabetes develops a seizure, it is prudent to call an ambulance. - Finally, if the patient does not regain full consciousness, then call EMS. Most patients will remain confused for about 45 to 90 minutes after a seizure, so you must use judgment in calling for help. - Avoid restraints but make sure the patient is in a bed with padded side rails - Place the individual in a lateral position with the neck slightly flexed; this will help the saliva drain  from the mouth and prevent the tongue from falling backward - Remove all nearby furniture and other hazards from the area - Provide verbal assurance as the individual is regaining consciousness - Provide the patient with privacy if possible - Call for help and start treatment as ordered by the caregiver   After the Seizure (Postictal Stage)  After a seizure, most patients experience confusion, fatigue, muscle pain and/or a headache. Thus, one should permit the individual to sleep. For the next few days, reassurance is essential. Being calm and helping reorient the person is also of importance.  Most seizures are painless and end spontaneously. Seizures are not harmful to others but can lead to complications such as stress on the lungs, brain and the heart. Individuals with prior lung problems may develop labored breathing and respiratory distress.      No orders of the defined types were placed in this encounter.   No orders of the defined types were placed in this encounter.   No follow-ups on file.    Windell Norfolk, MD 07/08/2023, 4:06 PM  [facility] [address] [place], [place] [address] [phone]

## 2023-07-09 ENCOUNTER — Other Ambulatory Visit: Payer: Self-pay | Admitting: Neurology

## 2023-07-09 ENCOUNTER — Telehealth (HOSPITAL_BASED_OUTPATIENT_CLINIC_OR_DEPARTMENT_OTHER): Payer: Self-pay | Admitting: *Deleted

## 2023-07-09 NOTE — Patient Instructions (Addendum)
Discontinue topiramate Continue Pregabalin for post herpetic neuralgia  Start Nurtec every other day as preventive medication Continue with Maxalt as needed for breakthrough headaches Referral to sleep neurology for evaluation of sleep apnea and also concern for sleepwalking/confusional arousal state Follow-up in 6 months or sooner if worse.

## 2023-07-09 NOTE — Telephone Encounter (Signed)
Called pt to schedule bp check but pt stated he is no longer being seen by dr Ihor Dow. Advised I would remove Dr De Peru off as the pcp with verbal  understanding

## 2023-07-10 ENCOUNTER — Other Ambulatory Visit (HOSPITAL_COMMUNITY): Payer: Self-pay

## 2023-07-10 ENCOUNTER — Telehealth: Payer: Self-pay

## 2023-07-10 NOTE — Telephone Encounter (Signed)
Pharmacy Patient Advocate Encounter   Received notification from RX Request Messages that prior authorization for Nurtec 75MG  dispersible tablets is required/requested.   Insurance verification completed.   The patient is insured through [employer] [employer] [facility] MEDICAID .   Per test claim: PA required; PA submitted to [facility] MEDICAID via CoverMyMeds Key/confirmation #/EOC [id number] Status is pending

## 2023-07-11 ENCOUNTER — Other Ambulatory Visit: Payer: Self-pay | Admitting: Neurology

## 2023-07-11 MED ORDER — EMGALITY 120 MG/ML ~~LOC~~ SOAJ: 120.0000 mg | SUBCUTANEOUS | 6 refills | Status: DC

## 2023-07-11 NOTE — Telephone Encounter (Signed)
Mychart message sent.

## 2023-07-11 NOTE — Telephone Encounter (Signed)
Emgality

## 2023-07-11 NOTE — Telephone Encounter (Signed)
Pharmacy Patient Advocate Encounter  Received notification from [facility] MEDICAID that Prior Authorization for Nurtec 75MG  dispersible tablets has been DENIED.  Full denial letter will be uploaded to the media tab. See denial reason below.   PA #/Case ID/Reference #: PA Case ID #: [id number]

## 2023-07-11 NOTE — Progress Notes (Signed)
Nurtec not approved, will prescribed Emgality.

## 2023-07-12 ENCOUNTER — Telehealth: Payer: Self-pay

## 2023-07-12 ENCOUNTER — Other Ambulatory Visit (HOSPITAL_COMMUNITY): Payer: Self-pay

## 2023-07-12 NOTE — Telephone Encounter (Signed)
Will the PT need the loading dose-Please advise

## 2023-07-15 ENCOUNTER — Other Ambulatory Visit (HOSPITAL_COMMUNITY): Payer: Self-pay

## 2023-07-15 NOTE — Telephone Encounter (Signed)
Pt will need loading dose

## 2023-07-15 NOTE — Telephone Encounter (Signed)
Pharmacy Patient Advocate Encounter   Received notification from Physician's Office that prior authorization for Emgality 120MG /ML auto-injectors (migraine) is required/requested.   Insurance verification completed.   The patient is insured through [facility] MEDICAID .   Per test claim: PA required; PA submitted to [facility] MEDICAID via CoverMyMeds Key/confirmation #/EOC [id number] Status is pending  This is for the 2ml loading dose.

## 2023-07-17 NOTE — Telephone Encounter (Signed)
He has tried Topiramate, Gabapentin and Pregabalin

## 2023-07-17 NOTE — Telephone Encounter (Signed)
Monica,  Did it say what injectable migraine med was on formulary? Thanks,  Production assistant, radio

## 2023-07-17 NOTE — Telephone Encounter (Signed)
   The preferred for medicaid are all of the injectables-however insurance denied because they want to see documentation that the PT has tried and or failed the meds listed in the denial letter.

## 2023-07-17 NOTE — Telephone Encounter (Signed)
Pharmacy Patient Advocate Encounter  Received notification from [facility] MEDICAID that Prior Authorization for Emgality 120MG /ML auto-injectors (migraine) has been DENIED.  Full denial letter will be uploaded to the media tab. See denial reason below.    PA #/Case ID/Reference #: [id number]

## 2023-07-18 NOTE — Telephone Encounter (Signed)
Pharmacy Patient Advocate Encounter   Received notification from Physician's Office that prior authorization for Emgality 120MG /ML auto-injectors (migraine) is required/requested.   Insurance verification completed.   The patient is insured through [facility] MEDICAID .   Per test claim: PA required; PA submitted to [facility] MEDICAID via CoverMyMeds Key/confirmation #/EOC [id number] Status is pending  Resubmitted PA per provider request-will more than likely be denied again since PT has not tried any of the preferred meds as presented in the previous denial note.

## 2023-07-19 ENCOUNTER — Other Ambulatory Visit (HOSPITAL_COMMUNITY): Payer: Self-pay

## 2023-07-19 NOTE — Telephone Encounter (Signed)
Pharmacy Patient Advocate Encounter  Received notification from [facility] MEDICAID that Prior Authorization for Emgality 120MG /ML auto-injectors (migraine) has been APPROVED from 07/18/2023 to 10/17/2023. Ran test claim, Copay is $Rejection due to insurance believe PT has another source of primary insurance other than Medicaid.. This test claim was processed through [facility]- copay amounts may vary at other pharmacies due to pharmacy/plan contracts, or as the patient moves through the different stages of their insurance plan.   PA #/Case ID/Reference #: [id number]    If the PT only has Medicaid he will need to call his Medicaid case worker or Social Worker to get them to handle this-If he does have a primary insurance coverage he will need to submit the pharmacy benefits to [place] either via MyChart, scanning in at the office or if they can provide the RX Bin#, RX PCN#, RX Group#, and the Member ID and I can process the PA for him.

## 2023-07-29 ENCOUNTER — Encounter: Payer: Self-pay | Admitting: Neurology

## 2023-07-30 MED ORDER — EMGALITY 120 MG/ML ~~LOC~~ SOAJ: 120.0000 mg | SUBCUTANEOUS | 6 refills | Status: AC

## 2023-07-30 NOTE — Telephone Encounter (Signed)
Placed paperwork in MD office for review and signature

## 2023-07-30 NOTE — Addendum Note (Signed)
Addended by: Danne Harbor on: 07/30/2023 07:49 AM   Modules accepted: Orders

## 2023-07-31 ENCOUNTER — Telehealth: Payer: Self-pay | Admitting: *Deleted

## 2023-07-31 DIAGNOSIS — Z0289 Encounter for other administrative examinations: Secondary | ICD-10-CM

## 2023-07-31 NOTE — Telephone Encounter (Signed)
Pt usable life form ready for p/u copy faxed to [phone]

## 2023-08-05 ENCOUNTER — Telehealth: Payer: Self-pay | Admitting: *Deleted

## 2023-08-05 NOTE — Telephone Encounter (Signed)
Pt medical records faxed to [facility] on 08/05/2023 [phone]

## 2023-08-14 ENCOUNTER — Institutional Professional Consult (permissible substitution): Payer: Medicaid Other | Admitting: Neurology

## 2023-08-29 ENCOUNTER — Ambulatory Visit: Payer: Medicaid Other | Admitting: Neurology

## 2023-08-29 ENCOUNTER — Encounter: Payer: Self-pay | Admitting: Neurology

## 2023-08-29 VITALS — BP 134/83 | HR 97 | Ht >= 80 in | Wt 315.0 lb

## 2023-08-29 DIAGNOSIS — R0681 Apnea, not elsewhere classified: Secondary | ICD-10-CM | POA: Diagnosis not present

## 2023-08-29 DIAGNOSIS — Z9189 Other specified personal risk factors, not elsewhere classified: Secondary | ICD-10-CM

## 2023-08-29 DIAGNOSIS — G4719 Other hypersomnia: Secondary | ICD-10-CM | POA: Diagnosis not present

## 2023-08-29 DIAGNOSIS — Z789 Other specified health status: Secondary | ICD-10-CM

## 2023-08-29 DIAGNOSIS — R0683 Snoring: Secondary | ICD-10-CM | POA: Diagnosis not present

## 2023-08-29 DIAGNOSIS — R519 Headache, unspecified: Secondary | ICD-10-CM

## 2023-08-29 DIAGNOSIS — R351 Nocturia: Secondary | ICD-10-CM

## 2023-08-29 DIAGNOSIS — E66811 Obesity, class 1: Secondary | ICD-10-CM

## 2023-08-29 NOTE — Patient Instructions (Signed)
Thank you for choosing [facility] for your sleep related care! It was nice to meet you today!   Here is what we discussed today:   Please try to reduce your caffeine intake as excessive caffeine can cause insomnia, difficulty initiating and maintaining sleep, also recurrent headaches.  Please limit your caffeine to 1 or 2 servings per day, avoid caffeine after 3 PM.  Please avoid watching TV in the bedroom.   Based on your symptoms and your exam I believe you are at risk for obstructive sleep apnea (aka OSA). We should proceed with a sleep study to determine whether you do or do not have OSA and how severe it is. Even, if you have mild OSA, I may want you to consider treatment with CPAP, as treatment of even borderline or mild sleep apnea can result and improvement of symptoms such as sleep disruption, daytime sleepiness, nighttime bathroom breaks, restless leg symptoms, improvement of headache syndromes, even improved mood disorder.   As explained, an attended sleep study (meaning you get to stay overnight in the sleep lab), lets [place] monitor sleep-related behaviors such as sleep talking and leg movements in sleep, in addition to monitoring for sleep apnea.  A home sleep test is a screening tool for sleep apnea diagnosis only, but unfortunately, does not help with any other sleep-related diagnoses.  Please remember, the long-term risks and ramifications of untreated moderate to severe obstructive sleep apnea may include (but are not limited to): increased risk for cardiovascular disease, including congestive heart failure, stroke, difficult to control hypertension, treatment resistant obesity, arrhythmias, especially irregular heartbeat commonly known as A. Fib. (atrial fibrillation); even type 2 diabetes has been linked to untreated OSA.   Other correlations that untreated obstructive sleep apnea include macular edema which is swelling of the retina in the eyes, droopy eyelid  syndrome, and elevated hemoglobin and hematocrit levels (often referred to as polycythemia).  Sleep apnea can cause disruption of sleep and sleep deprivation in most cases, which, in turn, can cause recurrent headaches, problems with memory, mood, concentration, focus, and vigilance. Most people with untreated sleep apnea report excessive daytime sleepiness, which can affect their ability to drive. Please do not drive or use heavy equipment or machinery, if you feel sleepy! Patients with sleep apnea can also develop difficulty initiating and maintaining sleep (aka insomnia).   Having sleep apnea may increase your risk for other sleep disorders, including involuntary behaviors sleep such as sleep terrors, sleep talking, sleepwalking.    Having sleep apnea can also increase your risk for restless leg syndrome and leg movements at night.   Please note that untreated obstructive sleep apnea may carry additional perioperative morbidity. Patients with significant obstructive sleep apnea (typically, in the moderate to severe degree) should receive, if possible, perioperative PAP (positive airway pressure) therapy and the surgeons and particularly the anesthesiologists should be informed of the diagnosis and the severity of the sleep disordered breathing.   We will call you or email you through MyChart with regards to your test results and plan a follow-up in sleep clinic accordingly. Most likely, you will hear from one of our nurses.   Our sleep lab administrative assistant will call you to schedule your sleep study and give you further instructions, regarding the check in process for the sleep study, arrival time, what to bring, when you can expect to leave after the study, etc., and to answer any other logistical questions you may have. If you don't hear back from her  by about 2 weeks from now, please feel free to call her direct line at [phone] or you can call our general clinic number, or email [place]  through My Chart.

## 2023-08-29 NOTE — Progress Notes (Signed)
Subjective:    Patient ID: Darrell King is a 57 y.o. male.  HPI    Huston Foley, MD, PhD [facility] [address] [address] [place], [place] [address]  Dear Amalia Hailey,  I saw your patient, Darrell King, upon your kind request, in my sleep clinic today for initial consultation of his sleep disorder, in particular, concern for underlying obstructive sleep apnea.  The patient is accompanied by his wife today.  He missed an appointment on 08/14/2023. As you know, Darrell King is a 57 year old male with an underlying medical history of migraine headaches, postherpetic neuralgia, history of VZV encephalitis/meningitis, hypertension, hyperlipidemia, gout, arthritis, and obesity, who reports snoring and excessive daytime somnolence as well as witnessed apneas per wife's report.  His Epworth sleepiness score is 18 out of 24, fatigue severity score is 59 out of 63.  He has had recurrent headaches.  He does drink quite a bit of caffeine in the form of coffee, 1 cup in the morning, soda and tea including [facility] and cola, altogether about 7 servings per day, as late as bedtime.  Bedtime is generally around 11 PM and he has trouble falling asleep and staying asleep.  He does not take anything to help him sleep at night.  Rise time is between 10 and 11 AM typically.  He does not work, he is on disability.  He has nocturia about once or twice per average night.  They do have a TV in the bedroom and watch until 10 or 11 PM and then turn it off.  They have 2 dogs in the household and 1 cat, the dogs typically sleep on the floor in the bedroom.  He lives with his wife and 3 sons, 1 daughter-in-law, 1 granddaughter, and wife's father.  His weight has been stable in the past couple of years.  I reviewed your office note from 07/08/2023.  His Past Medical History Is Significant For: Past Medical History:  Diagnosis Date   Acute gout 12/31/2016   Anemia 03/15/2022   Anxiety     "had taken Paxil; back in 1996; nothing since" (10/21/2013)   Arthritis    Complication of anesthesia    SLOW TO WAKE UP AFTER SURGERY IN 2019   Dyslipidemia 03/11/2016   12.5% 10-year risk of heart disease or stroke, should be on a moderate/high intensity statin.     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   Refusal of blood transfusions as patient is Jehovah's Witness     His Past Surgical History Is Significant For: Past Surgical History:  Procedure Laterality Date   COLONOSCOPY W/ POLYPECTOMY     INGUINAL HERNIA REPAIR Right 1995   INGUINAL HERNIA REPAIR Left 11/12/2013   Procedure: HERNIA REPAIR INGUINAL INCARCERATED;  Surgeon: Adolph Pollack, MD;  Location: [facility] OR;  Service: General;  Laterality: Left;   SHOULDER ARTHROSCOPY WITH OPEN ROTATOR CUFF REPAIR AND DISTAL CLAVICLE ACROMINECTOMY Right 06/02/2019   Procedure: right shoulder arthroscopy , mini open rotator cuff repair;  Surgeon: Cammy Copa, MD;  Location[facility] OR;  Service: Orthopedics;  Laterality: Right;   SHOULDER ARTHROSCOPY WITH SUBACROMIAL DECOMPRESSION, ROTATOR CUFF REPAIR AND BICEP TENDON REPAIR Right 07/14/2018   Procedure: RIGHT SHOULDER ARTHROSCOPY, DEBRIDEMENT, BICEPS TENODESIS, DISTAL CLAVICLE EXCISION AND MINI OPEN ROTATOR CUFF TEAR REPAIR;  Surgeon: Cammy Copa, MD;  Location:  [facility] OR;  Service: Orthopedics;  Laterality: Right;    His Family History Is Significant For: Family History  Problem Relation Age of Onset   Colon cancer Mother        colon   Cancer Mother    Cancer Sister        dont know what type   Sleep apnea Neg Hx     His Social History Is Significant For: Social History   Socioeconomic History   Marital status: Married    Spouse name: Not on file   Number of children: 3   Years of education: Not on file   Highest education  level: Not on file  Occupational History   Occupation: Maintenance Tech  Tobacco Use   Smoking status: Never   Smokeless tobacco: Never  Vaping Use   Vaping status: Never Used  Substance and Sexual Activity   Alcohol use: Not Currently    Alcohol/week: 1.0 - 2.0 standard drink of alcohol    Types: 1 - 2 Shots of liquor per week    Comment: occasionally.   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   amLODipine (NORVASC) 10 MG tablet Take 1 tablet (10 mg total) by mouth daily.   butalbital-acetaminophen-caffeine (FIORICET) 50-325-40 MG tablet Take 1 tablet by mouth every 6 (six) hours as needed for headache.   colchicine 0.6 MG tablet Take 1 tablet by mouth daily.   diclofenac Sodium (VOLTAREN) 1 % GEL Apply 4 g topically 4 (four) times daily.   Galcanezumab-gnlm (EMGALITY) 120 MG/ML SOAJ Inject 120 mg into the skin every 30 (thirty) days.   hydrochlorothiazide (HYDRODIURIL) 12.5 MG tablet Take 1 tablet (12.5 mg total) by mouth daily.   hydrochlorothiazide (HYDRODIURIL) 25 MG tablet Take 25 mg by mouth daily.   pregabalin (LYRICA) 75 MG capsule    rizatriptan (MAXALT-MLT) 10 MG disintegrating tablet Take 1 tablet (10 mg total) by mouth as needed for migraine. May repeat in 2 hours if needed   rosuvastatin (CRESTOR) 5 MG tablet  TAKE 1 TABLET (5 MG TOTAL) BY MOUTH DAILY.   pregabalin (LYRICA) 75 MG capsule Take 1 capsule (75 mg total) by mouth 2 (two) times daily.   No facility-administered encounter medications on file as of 08/29/2023.  :   Review of Systems:  Out of a complete 14 point review of systems, all are reviewed and negative with the exception of these symptoms as listed below:   Review of Systems  Neurological:        Pt here for sleep consult Pt snores,fatigue,headaches,hypertension  Pt denies sleep study.CPAP machine    ESS:18 FSS:59        Objective:  Neurological Exam  Physical Exam Physical Examination:   Vitals:  08/29/23 1342  BP: 134/83  Pulse: 97    General Examination: The patient is a very pleasant 57 y.o. male in no acute distress. He appears well-developed and well-nourished and well groomed.   HEENT: Normocephalic, atraumatic, pupils are equal, round and reactive to light, extraocular tracking is good without limitation to gaze excursion or nystagmus noted. Hearing is grossly intact. Face is symmetric with normal facial animation. Speech is clear with no dysarthria noted but edentulous sounding. There is no hypophonia. There is no lip, neck/head, jaw or voice tremor. Neck is supple with full range of passive and active motion. There are no carotid bruits on auscultation. Oropharynx exam reveals: moderate mouth dryness, edentulous on top, no dentures, a few teeth on the bottom, no partials.  Moderate to marked airway crowding secondary to large tongue, small airway entry.  Tonsils about 1+, Mallampati class III.  Neck circumference 19 three-quarter inches.  Tongue protrudes centrally and palate elevates symmetrically.    Chest: Clear to auscultation without wheezing, rhonchi or crackles noted.  Heart: S1+S2+0, regular and normal without murmurs, rubs or gallops noted.   Abdomen: Soft, non-tender and non-distended.  Extremities: There is no pitting edema in the distal  lower extremities bilaterally.   Skin: Warm and dry without trophic changes noted.   Musculoskeletal: exam reveals no obvious joint deformities.   Neurologically:  Mental status: The patient is awake, alert and oriented in all 4 spheres. His immediate and remote memory, attention, language skills and fund of knowledge are appropriate. There is no evidence of aphasia, agnosia, apraxia or anomia. Speech is clear with normal prosody and enunciation. Thought process is linear. Mood is normal and affect is normal.  Cranial nerves II - XII are as described above under HEENT exam.  Motor exam: Normal bulk, strength and tone is noted. There is no obvious action or resting tremor.  Fine motor skills and coordination: grossly intact.  Cerebellar testing: No dysmetria or intention tremor. There is no truncal or gait ataxia.  Sensory exam: intact to light touch in the upper and lower extremities.  Gait, station and balance: He stands easily. No veering to one side is noted. No leaning to one side is noted. Posture is age-appropriate and stance is narrow based. Gait shows normal stride length and normal pace. No problems turning are noted.   Assessment and Plan:  In summary, JESI BOSSERMAN is a very pleasant 57 y.o.-year old male with an underlying medical history of migraine headaches, postherpetic neuralgia, history of VZV encephalitis/meningitis, hypertension, hyperlipidemia, gout, arthritis, and obesity, whose history and physical exam are concerning for sleep disordered breathing, particularly obstructive sleep apnea (OSA).  While a laboratory attended sleep study is typically considered "gold standard" for evaluation of sleep disordered breathing, we mutually agreed to proceed with a home sleep test at this time.   I had a long chat with the patient and and his wife about my findings and the diagnosis of sleep apnea, particularly OSA, its prognosis and treatment options. We talked about  medical/conservative treatments, surgical interventions and non-pharmacological approaches for symptom control. I explained, in particular, the risks and ramifications of untreated moderate to severe OSA, especially with respect to developing cardiovascular disease down the road, including congestive heart failure (CHF), difficult to treat hypertension, cardiac arrhythmias (particularly A-fib), neurovascular complications including TIA, stroke and dementia. Even type 2 diabetes has, in part, been linked to untreated OSA. Symptoms of untreated OSA may include (but may not be limited to) daytime sleepiness, nocturia (  i.e. frequent nighttime urination), memory problems, mood irritability and suboptimally controlled or worsening mood disorder such as depression and/or anxiety, lack of energy, lack of motivation, physical discomfort, as well as recurrent headaches, especially morning or nocturnal headaches. We talked about the importance of maintaining a healthy lifestyle and striving for healthy weight.  In addition, we talked about the importance of striving for and maintaining good sleep hygiene.  In particular, I advised him to avoid watching TV in his bedroom.  He is furthermore advised to scale back on his excessive caffeine intake as excessive caffeine can cause sleep onset and maintenance difficulties and also recurrent headaches. I recommended a sleep study at this time. I outlined the differences between a laboratory attended sleep study which is considered more comprehensive and accurate over the option of a home sleep test (HST); the latter may lead to underestimation of sleep disordered breathing in some instances and does not help with diagnosing upper airway resistance syndrome and is not accurate enough to diagnose primary central sleep apnea typically. I outlined possible surgical and non-surgical treatment options of OSA, including the use of a positive airway pressure (PAP) device (i.e. CPAP,  AutoPAP/APAP or BiPAP in certain circumstances), a custom-made dental device (aka oral appliance, which would require a referral to a specialist dentist or orthodontist typically, and is generally speaking not considered for patients with full dentures or edentulous state), upper airway surgical options, such as traditional UPPP (which is not considered a first-line treatment) or the Inspire device (hypoglossal nerve stimulator, which would involve a referral for consultation with an ENT surgeon, after careful selection, following inclusion criteria - also not first-line treatment). I explained the PAP treatment option to the patient in detail, as this is generally considered first-line treatment.  The patient indicated that he would be willing to try PAP therapy, if the need arises. I explained the importance of being compliant with PAP treatment, not only for insurance purposes but primarily to improve patient's symptoms symptoms, and for the patient's long term health benefit, including to reduce His cardiovascular risks longer-term.    We will pick up our discussion about the next steps and treatment options after testing.  We will keep them posted as to the test results by phone call and/or MyChart messaging where possible.  We will plan to follow-up in sleep clinic accordingly as well.  I answered all their questions today and the patient and his wife were in agreement.   I encouraged them to call with any interim questions, concerns, problems or updates or email [place] through MyChart.  Generally speaking, sleep test authorizations may take up to 2 weeks, sometimes less, sometimes longer, the patient is encouraged to get in touch with [place] if they do not hear back from the sleep lab staff directly within the next 2 weeks.  Thank you very much for allowing me to participate in the care of this nice patient. If I can be of any further assistance to you please do not hesitate to talk to me.    Sincerely,   Huston Foley, MD, PhD

## 2023-09-11 ENCOUNTER — Telehealth: Payer: Self-pay | Admitting: Neurology

## 2023-09-11 NOTE — Telephone Encounter (Signed)
HST - [facility] [employer] [facility] no auth req   Patient is scheduled at [facility] for 10/01/23 at 9:30 AM  Mailed packet to the patient.

## 2023-10-01 ENCOUNTER — Ambulatory Visit: Payer: Medicaid Other | Admitting: Neurology

## 2023-10-01 DIAGNOSIS — E66811 Obesity, class 1: Secondary | ICD-10-CM

## 2023-10-01 DIAGNOSIS — G4733 Obstructive sleep apnea (adult) (pediatric): Secondary | ICD-10-CM | POA: Diagnosis not present

## 2023-10-01 DIAGNOSIS — R0681 Apnea, not elsewhere classified: Secondary | ICD-10-CM

## 2023-10-01 DIAGNOSIS — Z789 Other specified health status: Secondary | ICD-10-CM

## 2023-10-01 DIAGNOSIS — R351 Nocturia: Secondary | ICD-10-CM

## 2023-10-01 DIAGNOSIS — R0683 Snoring: Secondary | ICD-10-CM

## 2023-10-01 DIAGNOSIS — R519 Headache, unspecified: Secondary | ICD-10-CM

## 2023-10-01 DIAGNOSIS — Z9189 Other specified personal risk factors, not elsewhere classified: Secondary | ICD-10-CM

## 2023-10-01 DIAGNOSIS — G4719 Other hypersomnia: Secondary | ICD-10-CM

## 2023-10-02 NOTE — Procedures (Signed)
   [facility]  HOME SLEEP TEST (Watch PAT) REPORT  STUDY DATE: 10/01/23  DOB: 08/26/1966  MRN: [id number]  ORDERING CLINICIAN: Huston Foley, MD, PhD   REFERRING CLINICIAN: Dr. Windell Norfolk  CLINICAL INFORMATION/HISTORY: 57 year old male with an underlying medical history of migraine headaches, postherpetic neuralgia, history of VZV encephalitis/meningitis, hypertension, hyperlipidemia, gout, arthritis, and obesity, who reports snoring and excessive daytime somnolence as well as witnessed apneas.   Epworth sleepiness score: 18/24.  BMI: 33.7 kg/m  FINDINGS:   Sleep Summary:   Total Recording Time (hours, min): 7 hours, 47 min  Total Sleep Time (hours, min):  6 hours, 10 min  Percent REM (%):    26.9%   Respiratory Indices:   Calculated pAHI (per hour):  24/hour         REM pAHI:    31.4/hour       NREM pAHI: 22.5/hour  Central pAHI: 2.4/hour  Oxygen Saturation Statistics:    Oxygen Saturation (%) Mean: 94%   Minimum oxygen saturation (%):                 83%   O2 Saturation Range (%): 83-98%    O2 Saturation (minutes) <=88%: 1.3 min  Pulse Rate Statistics:   Pulse Mean (bpm):    59/min    Pulse Range (42-117/min)   IMPRESSION: OSA (obstructive sleep apnea), moderate  RECOMMENDATION:  This home sleep test demonstrates moderate obstructive sleep apnea with a total AHI of 24/hour and O2 nadir of 83%.  Intermittent mild to moderate snoring was detected, at times in the louder range. Treatment with a positive airway pressure (PAP) device is recommended. The patient will be advised to proceed with an autoPAP titration/trial at home for now. A full night titration study may be considered to optimize treatment settings, monitor proper oxygen saturations and aid with improvement of tolerance and adherence, if needed down the road. Alternative treatment options may include a dental device through dentistry or orthodontics in selected patients or  Inspire (hypoglossal nerve stimulator) in carefully selected patients (meeting inclusion criteria).  Concomitant weight loss is recommended (where clinically appropriate). Please note that untreated obstructive sleep apnea may carry additional perioperative morbidity. Patients with significant obstructive sleep apnea should receive perioperative PAP therapy and the surgeons and particularly the anesthesiologist should be informed of the diagnosis and the severity of the sleep disordered breathing. The patient should be cautioned not to drive, work at heights, or operate dangerous or heavy equipment when tired or sleepy. Review and reiteration of good sleep hygiene measures should be pursued with any patient. Other causes of the patient's symptoms, including circadian rhythm disturbances, an underlying mood disorder, medication effect and/or an underlying medical problem cannot be ruled out based on this test. Clinical correlation is recommended.  The patient and his referring provider will be notified of the test results. The patient will be seen in follow up in sleep clinic at [facility].  I certify that I have reviewed the raw data recording prior to the issuance of this report in accordance with the standards of the American Academy of Sleep Medicine (AASM).    INTERPRETING PHYSICIAN:   Huston Foley, MD, PhD Medical Director, [facility]) Diplomat, ABPN (Neurology and Sleep)   [facility] [address], [address] [place], [place] [address] [phone]

## 2023-10-02 NOTE — Progress Notes (Signed)
See procedure note.

## 2023-10-02 NOTE — Addendum Note (Signed)
Addended by: Huston Foley on: 10/02/2023 06:22 PM   Modules accepted: Orders

## 2023-10-03 ENCOUNTER — Telehealth: Payer: Self-pay

## 2023-10-03 ENCOUNTER — Other Ambulatory Visit (HOSPITAL_COMMUNITY): Payer: Self-pay

## 2023-10-03 NOTE — Telephone Encounter (Signed)
   It is time to renew his PA for Emgality-PT has not been evaluated since starting Emgality-I only answered the above questions in order to get the clinical questions to populate so we could address all of them at once. Please advise-

## 2023-10-03 NOTE — Telephone Encounter (Signed)
-----   Message from Huston Foley sent at 10/02/2023  6:22 PM EST ----- Patient referred by Dr. Teresa Coombs, seen by me on 08/29/2023, patient had a HST on 10/01/2023.    Please call and notify the patient that the recent home sleep test showed obstructive sleep apnea in the moderate range. I recommend treatment in the form of autoPAP, which means, that we don't have to bring him in for a sleep study with CPAP, but will let him start using a so called autoPAP machine at home, which is a CPAP-like machine with self-adjusting pressures. We will send the order to a local DME company (of his choice, or as per insurance requirement). The DME representative will fit him with a mask, educate him on how to use the machine, how to put the mask on, etc. I have placed an order in the chart. Please send the order, talk to patient, send report to referring MD. We will need a FU in sleep clinic for 10 weeks post-PAP set up, please arrange that with me or one of our NPs. Also reinforce the need for compliance with treatment. Thanks,   Huston Foley, MD, PhD [facility])

## 2023-10-03 NOTE — Telephone Encounter (Addendum)
Bobbye Morton, CMA  Ellis Parents Candie Chroman, Efraim Kaufmann; Angus Seller, Marion New orders have been placed for the above pt, DOB:2066/08/14 Thanks  New, Maryruth Bun, Abbe Amsterdam, CMA; Julius Bowels; Kathe Becton; 1 other Received, thank you!

## 2023-10-03 NOTE — Telephone Encounter (Signed)
I called pt. I advised pt that Dr. Frances Furbish reviewed their sleep study results and found that pt oderate OSA. Dr. Frances Furbish recommends that pt starts autopap. I reviewed PAP compliance expectations with the pt. Pt is agreeable to starting a CPAP. I advised pt that an order will be sent to a DME, [employer], and [employer] will call the pt within about one week after they file with the pt's insurance. [employer] will show the pt how to use the machine, fit for masks, and troubleshoot the CPAP if needed. A follow up appt was made for insurance purposes with Tylene Fantasia, 12/09/23. Pt verbalized understanding to arrive 15 minutes early and bring their CPAP. Pt verbalized understanding of results. Pt had no questions at this time but was encouraged to call back if questions arise. I have sent the order to [employer] and have received confirmation that they have received the order.

## 2023-10-16 ENCOUNTER — Other Ambulatory Visit (HOSPITAL_COMMUNITY): Payer: Self-pay

## 2023-11-01 ENCOUNTER — Other Ambulatory Visit: Payer: Self-pay | Admitting: Neurology

## 2023-12-09 ENCOUNTER — Telehealth: Payer: Self-pay | Admitting: Neurology

## 2023-12-09 ENCOUNTER — Encounter: Payer: Medicaid Other | Admitting: Adult Health

## 2023-12-09 ENCOUNTER — Encounter: Payer: Self-pay | Admitting: Adult Health

## 2023-12-09 NOTE — Telephone Encounter (Signed)
 Checked resmed, setup date was 10/14/23.

## 2023-12-09 NOTE — Telephone Encounter (Signed)
 Spoke with pt and got him rescheduled to 3/11 at 3:15 mychart video visit with [facility] NP. Pt will try his best to improve compliance. He's a hard time with waking up early and getting his 4 hours of usage in. He thanked me for the call back.

## 2023-12-09 NOTE — Telephone Encounter (Signed)
 Pt has called asking to r/s his initial CPAP f/u, but does not recall his start date re: his CPAP usage.  Phone rep unable to locate pt's start date, please call pt with the date he started using his CPAP so he can be r/s in time to be compliant.

## 2023-12-24 ENCOUNTER — Other Ambulatory Visit (HOSPITAL_BASED_OUTPATIENT_CLINIC_OR_DEPARTMENT_OTHER): Payer: Self-pay | Admitting: Family Medicine

## 2023-12-24 ENCOUNTER — Other Ambulatory Visit: Payer: Self-pay | Admitting: Neurology

## 2023-12-24 DIAGNOSIS — I10 Essential (primary) hypertension: Secondary | ICD-10-CM

## 2023-12-24 DIAGNOSIS — B029 Zoster without complications: Secondary | ICD-10-CM

## 2023-12-25 NOTE — Telephone Encounter (Signed)
 Last seen 08/29/23, next appt 01/16/24 Dispenses   Dispensed Days Supply Quantity Provider Pharmacy  PREGABALIN 75 MG CAPSULE 03/15/2023 30 60 each Windell Norfolk, MD [facility] [phone] - [address]

## 2024-01-06 NOTE — Progress Notes (Unsigned)
 Setup date: 10/14/23

## 2024-01-07 ENCOUNTER — Telehealth (INDEPENDENT_AMBULATORY_CARE_PROVIDER_SITE_OTHER): Payer: Medicaid Other | Admitting: Adult Health

## 2024-01-07 DIAGNOSIS — G4733 Obstructive sleep apnea (adult) (pediatric): Secondary | ICD-10-CM

## 2024-01-07 NOTE — Progress Notes (Signed)
 PATIENT: Darrell King DOB: Feb 01, 1966  REASON FOR VISIT: follow up HISTORY FROM: patient PRIMARY NEUROLOGIST: Dr. Frances Furbish   Virtual Visit via Video Note  I connected with Darrell King on 01/07/24 at  3:15 PM EDT by a video enabled telemedicine application located remotely at [facility] and verified that I am speaking with the correct person using two identifiers who was located at their own home.   I discussed the limitations of evaluation and management by telemedicine and the availability of in person appointments. The patient expressed understanding and agreed to proceed.   PATIENT: Darrell King DOB: Jun 18, 1966  REASON FOR VISIT: follow up HISTORY FROM: patient  HISTORY OF PRESENT ILLNESS: Today 01/07/24:  Darrell King is a 58 y.o. male with a history of OSA on CPAP. Returns today for follow-up.  Reports that he has struggled to get used to the CPAP. Reports that he does take it off during the night. DL is below.        REVIEW OF SYSTEMS: Out of a complete 14 system review of symptoms, the patient complains only of the following symptoms, and all other reviewed systems are negative.  ALLERGIES: No Known Allergies  HOME MEDICATIONS: Outpatient Medications Prior to Visit  Medication Sig Dispense Refill   acetaminophen (TYLENOL) 500 MG tablet Take 2 tablets (1,000 mg total) by mouth every 6 (six) hours as needed for mild pain. 90 tablet 1   amLODipine (NORVASC) 10 MG tablet Take 1 tablet (10 mg total) by mouth daily. 90 tablet 1   butalbital-acetaminophen-caffeine (FIORICET) 50-325-40 MG tablet Take 1 tablet by mouth every 6 (six) hours as needed for headache. 30 tablet 0   colchicine 0.6 MG tablet Take 1 tablet by mouth daily.     diclofenac Sodium (VOLTAREN) 1 % GEL Apply 4 g topically 4 (four) times daily. 350 g 1   hydrochlorothiazide (HYDRODIURIL) 12.5 MG tablet Take 1 tablet (12.5 mg total) by mouth daily. 90 tablet 1    hydrochlorothiazide (HYDRODIURIL) 25 MG tablet Take 25 mg by mouth daily.     pregabalin (LYRICA) 75 MG capsule      pregabalin (LYRICA) 75 MG capsule TAKE 1 CAPSULE BY MOUTH TWICE A DAY 60 capsule 5   rizatriptan (MAXALT-MLT) 10 MG disintegrating tablet Take 1 tablet (10 mg total) by mouth as needed for migraine. May repeat in 2 hours if needed 12 tablet 6   rosuvastatin (CRESTOR) 5 MG tablet TAKE 1 TABLET (5 MG TOTAL) BY MOUTH DAILY. 90 tablet 1   No facility-administered medications prior to visit.    PAST MEDICAL HISTORY: Past Medical History:  Diagnosis Date   Acute gout 12/31/2016   Anemia 03/15/2022   Anxiety    "had taken Paxil; back in 1996; nothing since" (10/21/2013)   Arthritis    Complication of anesthesia    SLOW TO WAKE UP AFTER SURGERY IN 2019   Dyslipidemia 03/11/2016   12.5% 10-year risk of heart disease or stroke, should be on a moderate/high intensity statin.     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   Refusal of blood transfusions as patient is Jehovah's Witness     PAST SURGICAL HISTORY: Past Surgical History:  Procedure Laterality Date   COLONOSCOPY W/ POLYPECTOMY     INGUINAL HERNIA REPAIR Right 1995   INGUINAL HERNIA REPAIR Left  11/12/2013   Procedure: HERNIA REPAIR INGUINAL INCARCERATED;  Surgeon: Adolph Pollack, MD;  Location: [facility] OR;  Service: General;  Laterality: Left;   SHOULDER ARTHROSCOPY WITH OPEN ROTATOR CUFF REPAIR AND DISTAL CLAVICLE ACROMINECTOMY Right 06/02/2019   Procedure: right shoulder arthroscopy , mini open rotator cuff repair;  Surgeon: Cammy Copa, MD;  Location: [facility] OR;  Service: Orthopedics;  Laterality: Right;   SHOULDER ARTHROSCOPY WITH SUBACROMIAL DECOMPRESSION, ROTATOR CUFF REPAIR AND BICEP TENDON REPAIR Right 07/14/2018   Procedure: RIGHT SHOULDER ARTHROSCOPY, DEBRIDEMENT,  BICEPS TENODESIS, DISTAL CLAVICLE EXCISION AND MINI OPEN ROTATOR CUFF TEAR REPAIR;  Surgeon: Cammy Copa, MD;  Location: [facility] OR;  Service: Orthopedics;  Laterality: Right;    FAMILY HISTORY: Family History  Problem Relation Age of Onset   Colon cancer Mother        colon   Cancer Mother    Cancer Sister        dont know what type   Sleep apnea Neg Hx     SOCIAL HISTORY: Social History   Socioeconomic History   Marital status: Married    Spouse name: Not on file   Number of children: 3   Years of education: Not on file   Highest education level: Not on file  Occupational History   Occupation: Maintenance Tech  Tobacco Use   Smoking status: Never   Smokeless tobacco: Never  Vaping Use   Vaping status: Never Used  Substance and Sexual Activity   Alcohol use: Not Currently    Alcohol/week: 1.0 - 2.0 standard drink of alcohol    Types: 1 - 2 Shots of liquor per week    Comment: occasionally.   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 Follows all commands speech and language fluent Cranial nerve II-XII:Extraocular movements were full. Facial symmetry noted DIAGNOSTIC DATA (LABS, IMAGING, TESTING) - I reviewed patient records, labs, notes, testing and imaging myself where available.  Lab Results  Component Value Date   WBC 6.3 04/26/2022   HGB 15.6 04/26/2022   HCT 44.5  04/26/2022   MCV 85 04/26/2022   PLT 218 04/26/2022      Component Value Date/Time   NA 144 12/03/2022 0940   K 3.8 12/03/2022 0940   CL 105 12/03/2022 0940   CO2 20 12/03/2022 0940   GLUCOSE 93 12/03/2022 0940   GLUCOSE 101 (H) 03/07/2022 0434   BUN 13 12/03/2022 0940   CREATININE 0.99 12/03/2022 0940   CREATININE 1.10 10/30/2013 1138   CALCIUM 9.7 12/03/2022 0940   PROT 7.6 12/03/2022 0940   ALBUMIN 4.6 12/03/2022 0940   AST 30 12/03/2022 0940   ALT 37 12/03/2022 0940   ALKPHOS 78 12/03/2022 0940   BILITOT 0.5 12/03/2022 0940   GFRNONAA >60 03/07/2022 0434   GFRNONAA 80 10/30/2013 1138   GFRAA >60  06/02/2019 1145   GFRAA >89 10/30/2013 1138   Lab Results  Component Value Date   CHOL 222 (H) 02/05/2022   HDL 31 (L) 02/05/2022   LDLCALC 127 (H) 02/05/2022   TRIG 361 (H) 02/05/2022   CHOLHDL 7.2 (H) 02/05/2022   Lab Results  Component Value Date   HGBA1C 5.5 02/05/2022   Lab Results  Component Value Date   VITAMINB12 367 12/31/2018   Lab Results  Component Value Date   TSH 2.010 02/05/2022      ASSESSMENT AND PLAN 58 y.o. year old male  has a past medical history of Acute gout (12/31/2016), Anemia (03/15/2022), Anxiety, Arthritis, Complication of anesthesia, Dyslipidemia (03/11/2016), Encounter for hepatitis C screening test for low risk patient (02/02/2021), Essential hypertension, benign (02/23/2016), Mixed hyperlipidemia (12/03/2022), New onset seizure (HCC) (03/02/2022), and Refusal of blood transfusions as patient is Jehovah's Witness. here with:  OSA on CPAP  CPAP compliance good Residual AHI is good Encouraged patient to continue using CPAP nightly and > 4 hours each night Advised him to try to use during the day for 30 minutes (desensitization) to help him adjust to using the machine. F/U in 2-3 months or sooner if needed   Butch Penny, MSN, NP-C 01/07/2024, 3:09 PM [facility] [address] [place], [place]  [address] [phone]

## 2024-01-16 ENCOUNTER — Encounter: Payer: Self-pay | Admitting: Neurology

## 2024-01-16 ENCOUNTER — Ambulatory Visit: Payer: Medicaid Other | Admitting: Neurology

## 2024-03-26 ENCOUNTER — Other Ambulatory Visit (HOSPITAL_BASED_OUTPATIENT_CLINIC_OR_DEPARTMENT_OTHER): Payer: Self-pay | Admitting: Family Medicine

## 2024-03-26 DIAGNOSIS — E782 Mixed hyperlipidemia: Secondary | ICD-10-CM

## 2024-03-31 ENCOUNTER — Telehealth: Payer: Self-pay | Admitting: Neurology

## 2024-03-31 NOTE — Telephone Encounter (Signed)
 Pt dropped off disability papers, paid $50 fee. I left in POD 2 for [place]. Pt would like faxed then will pick up for his records.

## 2024-04-01 DIAGNOSIS — Z0289 Encounter for other administrative examinations: Secondary | ICD-10-CM

## 2024-04-02 NOTE — Telephone Encounter (Signed)
 Received completed medical records back from clinical staff. Form was faxed to [employer] and confirmation log received. Spoke with patient to let him know it was faxed and that a copy would be left at the front desk for him to pick up. Original and copy given to medical records.

## 2024-04-09 ENCOUNTER — Encounter: Payer: Self-pay | Admitting: *Deleted

## 2024-04-13 ENCOUNTER — Telehealth: Admitting: Adult Health

## 2024-04-13 ENCOUNTER — Other Ambulatory Visit: Payer: Self-pay | Admitting: Neurology

## 2024-04-13 DIAGNOSIS — G4733 Obstructive sleep apnea (adult) (pediatric): Secondary | ICD-10-CM | POA: Diagnosis not present

## 2024-04-13 NOTE — Progress Notes (Signed)
 PATIENT: JAIDEN King DOB: 01-18-1966  REASON FOR VISIT: follow up HISTORY FROM: patient PRIMARY NEUROLOGIST: Dr. Omar Bibber   Virtual Visit via Video Note  I connected with Lynn Sark on 04/13/24 at  9:30 AM EDT by a video enabled telemedicine application located remotely at Home office  and verified that I am speaking with the correct person using two identifiers who was located at their own home in [place]   I discussed the limitations of evaluation and management by telemedicine and the availability of in person appointments. The patient expressed understanding and agreed to proceed.   PATIENT: Darrell King DOB: [birth year]  REASON FOR VISIT: follow up HISTORY FROM: patient  HISTORY OF PRESENT ILLNESS: Today 04/13/24:  Darrell King is a 58 y.o. male with a history of obstructive sleep apnea on CPAP. Returns today for follow-up.  Patient reports that he still struggling using the CPAP.  He finds it uncomfortable.  He has not tried a mask refitting yet.  He is willing to give it a try.  His download is below     01/07/24: TREVIAN King is a 58 y.o. male with a history of OSA on CPAP. Returns today for follow-up.  Reports that he has struggled to get used to the CPAP. Reports that he does take it off during the night. DL is below.        REVIEW OF SYSTEMS: Out of a complete 14 system review of symptoms, the patient complains only of the following symptoms, and all other reviewed systems are negative.  ALLERGIES: No Known Allergies  HOME MEDICATIONS: Outpatient Medications Prior to Visit  Medication Sig Dispense Refill   acetaminophen  (TYLENOL ) 500 MG tablet Take 2 tablets (1,000 mg total) by mouth every 6 (six) hours as needed for mild pain. 90 tablet 1   amLODipine  (NORVASC ) 10 MG tablet Take 1 tablet (10 mg total) by mouth daily. 90 tablet 1   butalbital -acetaminophen -caffeine  (FIORICET ) 50-325-40 MG tablet Take 1 tablet by mouth every 6 (six) hours as  needed for headache. 30 tablet 0   colchicine  0.6 MG tablet Take 1 tablet by mouth daily.     diclofenac  Sodium (VOLTAREN ) 1 % GEL Apply 4 g topically 4 (four) times daily. 350 g 1   hydrochlorothiazide  (HYDRODIURIL ) 12.5 MG tablet Take 1 tablet (12.5 mg total) by mouth daily. 90 tablet 1   hydrochlorothiazide  (HYDRODIURIL ) 25 MG tablet Take 25 mg by mouth daily.     pregabalin  (LYRICA ) 75 MG capsule      pregabalin  (LYRICA ) 75 MG capsule TAKE 1 CAPSULE BY MOUTH TWICE A DAY 60 capsule 5   rizatriptan  (MAXALT -MLT) 10 MG disintegrating tablet TAKE 1 TABLET BY MOUTH AS NEEDED FOR MIGRAINE. MAY REPEAT IN 2 HOURS IF NEEDED 12 tablet 6   rosuvastatin  (CRESTOR ) 5 MG tablet TAKE 1 TABLET (5 MG TOTAL) BY MOUTH DAILY. 90 tablet 1   No facility-administered medications prior to visit.    PAST MEDICAL HISTORY: Past Medical History:  Diagnosis Date   Acute gout 12/31/2016   Anemia 03/15/2022   Anxiety    had taken Paxil; back in 1996; nothing since (10/21/2013)   Arthritis    Complication of anesthesia    SLOW TO WAKE UP AFTER SURGERY IN 2019   Dyslipidemia 03/11/2016   12.5% 10-year risk of heart disease or stroke, should be on a moderate/high intensity statin.     Encounter for hepatitis C screening test for low risk patient 02/02/2021  Essential hypertension, benign 02/23/2016   Mixed hyperlipidemia 12/03/2022   New onset seizure (HCC) 03/02/2022   Refusal of blood transfusions as patient is Jehovah's Witness     PAST SURGICAL HISTORY: Past Surgical History:  Procedure Laterality Date   COLONOSCOPY W/ POLYPECTOMY     INGUINAL HERNIA REPAIR Right 1995   INGUINAL HERNIA REPAIR Left 11/12/2013   Procedure: HERNIA REPAIR INGUINAL INCARCERATED;  Surgeon: Harlee Lichtenstein, MD;  Location: [facility] OR;  Service: General;  Laterality: Left;   SHOULDER ARTHROSCOPY WITH OPEN ROTATOR CUFF REPAIR AND DISTAL CLAVICLE ACROMINECTOMY Right 06/02/2019   Procedure: right shoulder arthroscopy , mini open rotator  cuff repair;  Surgeon: Jasmine Mesi, MD;  Location: [facility] OR;  Service: Orthopedics;  Laterality: Right;   SHOULDER ARTHROSCOPY WITH SUBACROMIAL DECOMPRESSION, ROTATOR CUFF REPAIR AND BICEP TENDON REPAIR Right 07/14/2018   Procedure: RIGHT SHOULDER ARTHROSCOPY, DEBRIDEMENT, BICEPS TENODESIS, DISTAL CLAVICLE EXCISION AND MINI OPEN ROTATOR CUFF TEAR REPAIR;  Surgeon: Jasmine Mesi, MD;  Location: [facility] OR;  Service: Orthopedics;  Laterality: Right;    FAMILY HISTORY: Family History  Problem Relation Age of Onset   Colon cancer Mother        colon   Cancer Mother    Cancer Sister        dont know what type   Sleep apnea Neg Hx     SOCIAL HISTORY: Social History   Socioeconomic History   Marital status: Married    Spouse name: Not on file   Number of children: 3   Years of education: Not on file   Highest education level: Not on file  Occupational History   Occupation: Maintenance Tech  Tobacco Use   Smoking status: Never   Smokeless tobacco: Never  Vaping Use   Vaping status: Never Used  Substance and Sexual Activity   Alcohol use: Not Currently    Alcohol/week: 1.0 - 2.0 standard drink of alcohol    Types: 1 - 2 Shots of liquor per week    Comment: occasionally.   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 Follows all commands speech and language fluent Cranial nerve II-XII: Facial  symmetry noted DIAGNOSTIC DATA (LABS, IMAGING, TESTING) - I reviewed patient records, labs, notes, testing and imaging myself where available.  Lab Results  Component Value Date   WBC 6.3 04/26/2022   HGB 15.6 04/26/2022   HCT 44.5 04/26/2022   MCV 85 04/26/2022   PLT 218 04/26/2022      Component Value Date/Time   NA 144 12/03/2022 0940   K 3.8 12/03/2022 0940   CL 105 12/03/2022 0940   CO2 20 12/03/2022 0940   GLUCOSE 93 12/03/2022 0940   GLUCOSE 101 (H) 03/07/2022 0434   BUN 13 12/03/2022 0940   CREATININE 0.99 12/03/2022 0940   CREATININE 1.10 10/30/2013 1138  CALCIUM  9.7 12/03/2022 0940   PROT 7.6 12/03/2022 0940   ALBUMIN 4.6 12/03/2022 0940   AST 30 12/03/2022 0940   ALT 37 12/03/2022 0940   ALKPHOS 78 12/03/2022 0940   BILITOT 0.5 12/03/2022 0940   GFRNONAA >60 03/07/2022 0434   GFRNONAA 80 10/30/2013 1138   GFRAA >60 06/02/2019 1145   GFRAA >89 10/30/2013 1138   Lab Results  Component Value Date   CHOL 222 (H) 02/05/2022   HDL 31 (L) 02/05/2022   LDLCALC 127 (H) 02/05/2022   TRIG 361 (H) 02/05/2022   CHOLHDL 7.2 (H) 02/05/2022   Lab Results  Component Value Date   HGBA1C 5.5 02/05/2022   Lab Results  Component Value Date   VITAMINB12 367 12/31/2018   Lab Results  Component Value Date   TSH 2.010 02/05/2022      ASSESSMENT AND PLAN 58 y.o. year old male  has a past medical history of Acute gout (12/31/2016), Anemia (03/15/2022), Anxiety, Arthritis, Complication of anesthesia, Dyslipidemia (03/11/2016), Encounter for hepatitis C screening test for low risk patient (02/02/2021), Essential hypertension, benign (02/23/2016), Mixed hyperlipidemia (12/03/2022), New onset seizure (HCC) (03/02/2022), and Refusal of blood transfusions as patient is Jehovah's Witness. here with:  OSA on CPAP  CPAP noncompliance Will order a mask refitting Encouraged patient to continue using CPAP nightly and > 4 hours each night Did discuss different treatment options such  as inspire and a dental device should the mask refitting not all for many benefit F/U in 6 months or sooner if needed he was advised to call if he is still struggling to use his CPAP after mask refitting  The patient's condition requires frequent monitoring and adjustments in the treatment plan, reflecting the ongoing complexity of care.  This provider is the continuing focal point for all needed services for this condition.  Clem Currier, MSN, NP-C 04/13/2024, 9:32 AM [facility] [address], [address] [place], [place] [address] [phone]

## 2024-04-13 NOTE — Patient Instructions (Signed)
 Try using CPAP nightly and greater than 4 hours each night Mask refitting ordered If the mask refitting is not beneficial we can consider a dental device or inspire device If your symptoms worsen or you develop new symptoms please let us  know.

## 2024-04-29 ENCOUNTER — Other Ambulatory Visit (HOSPITAL_BASED_OUTPATIENT_CLINIC_OR_DEPARTMENT_OTHER): Payer: Self-pay | Admitting: Family Medicine

## 2024-04-29 DIAGNOSIS — E782 Mixed hyperlipidemia: Secondary | ICD-10-CM

## 2024-06-16 ENCOUNTER — Telehealth: Payer: Self-pay | Admitting: *Deleted

## 2024-06-16 NOTE — Telephone Encounter (Addendum)
 Darrell King from [employer] has returned call to [place], [place], he is asking to be called on direct # [phone]

## 2024-06-16 NOTE — Telephone Encounter (Signed)
 Megan NP received a fax from the clinical consultant with pt's disability company which asked for clarification about pt's ability to perform heavy level work. Pt's visits with Megan NP have only been to address new start autopap. Migraine and seizure are being managed by Dr Gregg and his next visit with him is on 08/05/24. There is a signed release form by patient allowing information to be provided to this company. I called them at [phone] to speak with Wanda. She was unavailable so I put in a request for a call back.

## 2024-06-16 NOTE — Telephone Encounter (Signed)
 Spoke with Almeda and provided clarification regarding Duwaine NP's visit reason as written below. He said Dr Gregg can answer the work question and sign when patient is seen on 08/05/24. Letter has been given to Dr Janean pod to hold until appt.

## 2024-07-22 ENCOUNTER — Encounter: Payer: Self-pay | Admitting: Neurology

## 2024-07-22 ENCOUNTER — Ambulatory Visit: Admitting: Neurology

## 2024-07-22 ENCOUNTER — Other Ambulatory Visit: Payer: Self-pay | Admitting: Neurology

## 2024-07-22 VITALS — BP 158/95 | HR 66 | Ht >= 80 in | Wt 313.5 lb

## 2024-07-22 DIAGNOSIS — G43909 Migraine, unspecified, not intractable, without status migrainosus: Secondary | ICD-10-CM | POA: Diagnosis not present

## 2024-07-22 DIAGNOSIS — R519 Headache, unspecified: Secondary | ICD-10-CM

## 2024-07-22 MED ORDER — NURTEC 75 MG PO TBDP: 75.0000 mg | ORAL_TABLET | ORAL | 11 refills | Status: AC

## 2024-07-22 NOTE — Progress Notes (Signed)
 [facility]  PATIENT: Darrell King DOB: 1966/06/11  REQUESTING CLINICIAN: Ilah Crigler, MD HISTORY FROM: Patient and spouse  REASON FOR VISIT: Follow up headaches, post herpetic neuralgia     HISTORICAL  CHIEF COMPLAINT:  Chief Complaint  Patient presents with   Follow-up    Pt in room 12. Wife in room. Here migraine follow up.   INTERVAL HISTORY 07/22/2024 Discussed the use of AI scribe software for clinical note transcription with the patient, who gave verbal consent to proceed.  Darrell King is a 58 year old male with sleep apnea and chronic headaches who presents with difficulty tolerating CPAP therapy and persistent headaches.  He experiences significant difficulty sleeping at night due to discomfort with CPAP therapy. He has tried both nasal and full-face masks but finds them uncomfortable, leading to frequent removal during sleep. He describes tossing and turning, often waking up to find the mask on the floor. He has not used the CPAP machine for some time due to these issues.  He experiences chronic headaches, which have decreased in frequency and intensity recently. He has headaches approximately three times a week, which are less severe than before. He has been using Lyrica  (pregabalin ) for nerve pain, which causes daytime sleepiness. Attempts to reduce caffeine  intake resulted in increased headaches. He has tried Nurtec for headache management, which was effective, but insurance issues have limited its use.  He experiences persistent nerve pain despite taking pregabalin . He describes a tingling sensation in the affected area, which he refers to as 'new skin.' He frequently checks for visible signs of rash but finds none. He has received the shingles vaccine.  He has a history of hypertension and is currently taking amlodipine  and a diuretic. He does not regularly monitor his blood pressure at home despite having the equipment. He reports occasional  back pain.    INTERVAL HISTORY 07/08/2023:  Patient presents for follow-up, he is accompanied by wife.  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  His Keppra  was discontinued.  He continues to reports daily headache, still having headache despite the topiramate  and the Maxalt  but has not had any event of unresponsiveness or losing track of time.  He reports after taking the Maxalt  he will feel tired and lay down.  After any type of activity, his headaches will get worse.  He does report morning headaches daily.  Headaches are usually bitemporal.  He has never been worked up for sleep apnea. Wife reports loud snoring and episode of apnea during sleep. He does report feeling tired throughout the day and morning headaches. he report his neck size is 19.5.  He does fall asleep while watching TV and sometimes drowsy when on the passenger side.   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  She said some nights he will wake up, sit at the edge of the bed, sometime he will pick up some things on the dresser and goes back to sleep. He does not remember those events.   There is also times where he will wake up and walk around the house, goes to the refrigerator to get food and again does not remember these events.  This is a consistent for sleepwalking.  Again he never had any type of sleep study in the past.  His BMI is 37.    INTERVAL HISTORY 04/01/2023:  Darrell King presents  today for follow-up, he is accompanied by wife.  Last visit was in November and since then wife reports he has 2 additional events.  He is compliant with his Keppra  750 mg twice daily but events are still occurring.  Wife reports that both events were preceded by severe headache pain then patient will feel weak and complains of dizziness.  The entire episode would last about an hour and then he will  be  back to his normal self.  There are no report of convulsion, no tongue biting or urinary incontinence.  During this time also he is aware and able to follow direction.  One event happened at home when he was playing card and he was able to get up and sit on the couch with the help of family member and the other event happened while he was in the grocery store.  He was able to lean over the cart and walk to his car before resting there. In term of his post herpetic Neuralgia, he continues to experience occasional pain but wife reports no active infection, no vesicle formation.    INTERVAL HISTORY 09/25/22:  Patient presents today for follow-up, he is accompanied by wife.  At last visit in July, we had plan to repeat his MRI/MRA  but he lost his insurance and was not able to complete the tests. He reports the shingle pain is still present.  Currently he is on gabapentin  600 mg 3 times a day.  Due to side effect he is interested in coming down on the dose. When he comes to the seizures, wife reported 3 events where he is unresponsive and have mild tremor.  The last event was 3 weeks ago when he was holding his grandchild.  Patient does not remember these events.  She denies any generalized tonic-clonic seizure like the one that happened in the hospital. He is still out of work due to ongoing symptoms.    INTERVAL HISTORY 05/15/22: Patient presents today for follow-up, he is accompanied by his wife.  Since last visit in May, he has been experiencing episodes of severe headaches and unresponsiveness.  He reported during these episodes, he can feel heat coming under his feet all the way up to his head followed by severe headache then he will be unconscious; this can last for 15 minutes up to 45 minutes.  During this time wife reported she is trying to talk to him but he is unresponsive or he will mumble.  After each episode, patient does not remember the episode at all.  In total he has happened 3 times.  They  have not been to the hospital after each episode.  Feels heat coming up and feels very weak, feeling dizzy. He denies any other symptoms associated with the headaches. He still experiences the post herpetic neuralgia.    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  Patient reports presenting to his primary care doctor on May 5, for shingle, at that time they were drawing blood and patient starts complaining of not feeling well, he was on the wheelchair, wife noted that patient eyes rolled back with a blank stare and he went limp.  There was report patient being confused after the events.  Because of the seizure-like activity he was referred to [facility] health for admission.  His MRI did not show any acute abnormalities EEG was normal, his LP was positive for VZV.  He was started on Keppra  and antiviral.  Since discharge from the hospital he is complaining of headache and also post herpetic neuralgia.  He denies any additional seizures and denies any seizure risk factors.  He is compliant with the Keppra  medication and denies any side effects.     Hospital summary  Patient is a 58 y.o.  male with history of HTN, HLD, aseptic meningitis in 2014-who developed shingles in his right flank area of his torso-started on Valtrex  3 days prior to this hospitalization-subsequently developed fever 1 day prior to this hospitalization-he was at his PCPs office when he was noted to have a generalized tonic-clonic seizure.  He was subsequently sent to the emergency room-and admitted to [facility] service.   Varicella-zoster encephalitis/meningitis: Headache, neck stiffness and seizures, also had active shingles in his right T7 dermatome.  Rash has dried up has been placed on IV acyclovir  started on 03/02/2022  of [redacted] weeks along with with gentle IV fluids, stable EEG and MRI.  CSF serology positive for VZV, PICC line was placed on 03/06/2022 he was acyclovir  stop date is 03/16/2022, he was seen by neurology along with ID and IR.  Headaches much better flank discomfort much improved, no further seizure activity. Currently on Keppra  as well.  Will follow outpatient with neurology and ID postdischarge along with PCP.   Right T7 dermatome shingles: On IV acyclovir -on gabapentin  for neuropathic pain.  Overall improving.  Handedness: Right handed   Onset: 03/02/22  Seizure Type: Unclear, possibly generalized  Current frequency: Only once  Any injuries from seizures: Denies  Seizure risk factors: Viral encephalitis  Previous ASMs: Levetiracetam , Topiramate  for headaches prevention   Currenty ASMs: None   ASMs side effects: Denies  Brain Images: Normal MRI brain  Previous EEGs: Normal EEG   OTHER MEDICAL CONDITIONS: Hypertension, Hyperlipidemia, Gout   REVIEW OF SYSTEMS: Full 14 system review of systems performed and negative with exception of: As noted in the HPI  ALLERGIES: No Known Allergies  HOME MEDICATIONS: Outpatient Medications Prior to Visit  Medication Sig Dispense Refill   acetaminophen  (TYLENOL ) 500 MG tablet Take 2 tablets (1,000 mg total) by mouth every 6 (six) hours as needed for mild pain. 90 tablet 1   amLODipine  (NORVASC ) 10 MG tablet Take 1 tablet (10 mg total) by mouth daily. 90 tablet 1   butalbital -acetaminophen -caffeine  (FIORICET ) 50-325-40 MG tablet Take 1 tablet by mouth every 6 (six) hours as needed for headache. 30 tablet 0   colchicine  0.6 MG tablet Take 1 tablet by mouth daily.     diclofenac  Sodium (VOLTAREN ) 1 % GEL Apply 4 g topically 4 (four) times daily. 350 g 1   hydrochlorothiazide  (HYDRODIURIL ) 25 MG tablet Take 25 mg by mouth daily.     pregabalin  (LYRICA ) 75 MG capsule      rizatriptan  (MAXALT -MLT) 10 MG disintegrating tablet TAKE 1 TABLET BY MOUTH AS  NEEDED FOR MIGRAINE. MAY REPEAT IN 2 HOURS IF NEEDED 12 tablet 6   rosuvastatin  (CRESTOR ) 5 MG tablet TAKE 1 TABLET (5 MG TOTAL) BY MOUTH DAILY. 90 tablet 1   pregabalin  (LYRICA ) 75 MG capsule TAKE 1 CAPSULE BY MOUTH TWICE A DAY 60 capsule 5   hydrochlorothiazide  (HYDRODIURIL ) 12.5 MG tablet Take 1 tablet (12.5 mg total) by mouth daily. (Patient not taking: Reported on 07/22/2024) 90 tablet 1   No facility-administered medications prior to visit.  PAST MEDICAL HISTORY: Past Medical History:  Diagnosis Date   Acute gout 12/31/2016   Anemia 03/15/2022   Anxiety    had taken Paxil; back in 1996; nothing since (10/21/2013)   Arthritis    Complication of anesthesia    SLOW TO WAKE UP AFTER SURGERY IN 2019   Dyslipidemia 03/11/2016   12.5% 10-year risk of heart disease or stroke, should be on a moderate/high intensity statin.     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   Refusal of blood transfusions as patient is Jehovah's Witness     PAST SURGICAL HISTORY: Past Surgical History:  Procedure Laterality Date   COLONOSCOPY W/ POLYPECTOMY     INGUINAL HERNIA REPAIR Right 1995   INGUINAL HERNIA REPAIR Left 11/12/2013   Procedure: HERNIA REPAIR INGUINAL INCARCERATED;  Surgeon: Krystal JINNY Russell, MD;  Location: [facility] OR;  Service: General;  Laterality: Left;   SHOULDER ARTHROSCOPY WITH OPEN ROTATOR CUFF REPAIR AND DISTAL CLAVICLE ACROMINECTOMY Right 06/02/2019   Procedure: right shoulder arthroscopy , mini open rotator cuff repair;  Surgeon: Addie Cordella Hamilton, MD;  Location: [facility] OR;  Service: Orthopedics;  Laterality: Right;   SHOULDER ARTHROSCOPY WITH SUBACROMIAL DECOMPRESSION, ROTATOR CUFF REPAIR AND BICEP TENDON REPAIR Right 07/14/2018   Procedure: RIGHT SHOULDER ARTHROSCOPY, DEBRIDEMENT, BICEPS TENODESIS, DISTAL CLAVICLE EXCISION AND MINI OPEN  ROTATOR CUFF TEAR REPAIR;  Surgeon: Addie Cordella Hamilton, MD;  Location: [facility] OR;  Service: Orthopedics;  Laterality: Right;    FAMILY HISTORY: Family History  Problem Relation Age of Onset   Colon cancer Mother        colon   Cancer Mother    Cancer Sister        dont know what type   Sleep apnea Neg Hx     SOCIAL HISTORY: Social History   Socioeconomic History   Marital status: Married    Spouse name: Not on file   Number of children: 3   Years of education: Not on file   Highest education level: Not on file  Occupational History   Occupation: Maintenance Tech  Tobacco Use   Smoking status: Never   Smokeless tobacco: Never  Vaping Use   Vaping status: Never Used  Substance and Sexual Activity   Alcohol use: Yes    Alcohol/week: 1.0 - 2.0 standard drink of alcohol    Types: 1 - 2 Shots of liquor per week    Comment: occasionally.   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 Wt Readings from Last 3 Encounters:  07/22/24 (!) 313 lb  8 oz (142.2 kg)  08/29/23 (!) 315 lb (142.9 kg)  07/08/23 (!) 316 lb 8 oz (143.6 kg)   Patient is in no distress; well developed, nourished and groomed; neck is supple  MUSCULOSKELETAL: Gait, strength, tone, movements noted in Neurologic exam below  NEUROLOGIC: MENTAL  STATUS:      No data to display         awake, alert, oriented to person, place and time recent and remote memory intact normal attention and concentration language fluent, comprehension intact, naming intact fund of knowledge appropriate  CRANIAL NERVE:  2nd, 3rd, 4th, 6th - visual fields full to confrontation, extraocular muscles intact, no nystagmus 5th - facial sensation symmetric 7th - facial strength symmetric 8th - hearing intact 9th - palate elevates symmetrically, uvula midline 11th - shoulder shrug symmetric 12th - tongue protrusion midline  MOTOR:  normal bulk and tone, full strength in the BUE, BLE  SENSORY:  Normal sensation  COORDINATION:  finger-nose-finger, fine finger movements normal  GAIT/STATION:  normal   DIAGNOSTIC DATA (LABS, IMAGING, TESTING) - I reviewed patient records, labs, notes, testing and imaging myself where available.  Lab Results  Component Value Date   WBC 6.3 04/26/2022   HGB 15.6 04/26/2022   HCT 44.5 04/26/2022   MCV 85 04/26/2022   PLT 218 04/26/2022      Component Value Date/Time   NA 144 12/03/2022 0940   K 3.8 12/03/2022 0940   CL 105 12/03/2022 0940   CO2 20 12/03/2022 0940   GLUCOSE 93 12/03/2022 0940   GLUCOSE 101 (H) 03/07/2022 0434   BUN 13 12/03/2022 0940   CREATININE 0.99 12/03/2022 0940   CREATININE 1.10 10/30/2013 1138   CALCIUM  9.7 12/03/2022 0940   PROT 7.6 12/03/2022 0940   ALBUMIN 4.6 12/03/2022 0940   AST 30 12/03/2022 0940   ALT 37 12/03/2022 0940   ALKPHOS 78 12/03/2022 0940   BILITOT 0.5 12/03/2022 0940   GFRNONAA >60 03/07/2022 0434   GFRNONAA 80 10/30/2013 1138   GFRAA >60 06/02/2019 1145   GFRAA >89 10/30/2013 1138   Lab Results  Component Value Date   CHOL 222 (H) 02/05/2022   HDL 31 (L) 02/05/2022   LDLCALC 127 (H) 02/05/2022   TRIG 361 (H) 02/05/2022   Lab Results  Component Value Date   HGBA1C 5.5 02/05/2022   Lab Results  Component Value Date   VITAMINB12 367  12/31/2018   Lab Results  Component Value Date   TSH 2.010 02/05/2022    MRI Brain 03/04/22 1. No acute intracranial abnormality. 2. Focus of magnetic susceptibility effect at the paramedian undersurface of the right tentorial leaflet, likely an area of remote hemorrhage.   EEG 03/04/22 This study is within normal limits. The excessive beta activity seen in the background is most likely due to the effect of benzodiazepine and is a benign EEG pattern. No seizures or epileptiform discharges were seen throughout the recording.  EEG 05/24/22 This is a normal EEG recording in the waking and sleeping state. No evidence interictal epileptiform discharges were seen at any time during the recording.  A normal EEG does not exclude a diagnosis of epilepsy.    ASSESSMENT AND PLAN  58 y.o. year old male  with hypertension, hyperlipidemia, obesity, gout, history of viral encephalitis and herpes Zoster re-activation who is presenting for follow up.   In terms of post herpetic neuralgia, we will continue him on pregabalin .  He is doing well but on occasion will have sharp pain,  no current concerns. Will recommend to take 300 mg of Pregabalin  nightly to limit daytime sleepiness (instead of 150 mg BID)    Chronic migraine with medication overuse Chronic migraine with reduced frequency and severity, currently experiencing headaches approximately three times a week. Previous attempts to reduce caffeine  intake resulted in increased headache frequency, suggesting possible caffeine  withdrawal headaches. Nurtec was previously prescribed but not covered by insurance. He has tried topiramate , gabapentin , and pregabalin  as preventive treatments. - Attempt prior authorization for Nurtec again. Darrell King) - Instruct to use Nurtec every other day if approved.  Postherpetic neuralgia Persistent postherpetic neuralgia with tingling sensation in the affected area, despite pregabalin  treatment. No visible rash or  skin changes. Describes the sensation as 'new skin' and is concerned about potential recurrence of shingles. He has received the shingles vaccine. - Advise using over-the-counter lidocaine  cream for symptomatic relief of itching and tingling. - Adjust pregabalin  dosing to two tablets at night to potentially improve sleep and manage pain.    1. Recurrent headache   2. Episodic migraine      Patient Instructions  Continue current medications Will prescribe Nurtec again, tells me that they switch insurance hopefully this will be approval since the patient has tried topiramate , pregabalin  and gabapentin  for headaches Continue with pregabalin  for the postherpetic neuralgia, take 2 tablet at night instead of 1 tablet twice daily to limit daytime sleepiness Will complete his disability disability paperwork Follow-up in 6 months or sooner if worse.   Per [place]  DMV statutes, patients with seizures are not allowed to drive until they have been seizure-free for six months.  Other recommendations include using caution when using heavy equipment or power tools. Avoid working on ladders or at heights. Take showers instead of baths.  Do not swim alone.  Ensure the water temperature is not too high on the home water heater. Do not go swimming alone. Do not lock yourself in a room alone (i.e. bathroom). When caring for infants or small children, sit down when holding, feeding, or changing them to minimize risk of injury to the child in the event you have a seizure. Maintain good sleep hygiene. Avoid alcohol.  Also recommend adequate sleep, hydration, good diet and minimize stress.   During the Seizure  - First, ensure adequate ventilation and place patients on the floor on their left side  Loosen clothing around the neck and ensure the airway is patent. If the patient is clenching the teeth, do not force the mouth open with any object as this can cause severe damage - Remove all items from the  surrounding that can be hazardous. The patient may be oblivious to what's happening and may not even know what he or she is doing. If the patient is confused and wandering, either gently guide him/her away and block access to outside areas - Reassure the individual and be comforting - Call 911. In most cases, the seizure ends before EMS arrives. However, there are cases when seizures may last over 3 to 5 minutes. Or the individual may have developed breathing difficulties or severe injuries. If a pregnant patient or a person with diabetes develops a seizure, it is prudent to call an ambulance. - Finally, if the patient does not regain full consciousness, then call EMS. Most patients will remain confused for about 45 to 90 minutes after a seizure, so you must use judgment in calling for help. - Avoid restraints but make sure the patient is in a bed with padded side  rails - Place the individual in a lateral position with the neck slightly flexed; this will help the saliva drain from the mouth and prevent the tongue from falling backward - Remove all nearby furniture and other hazards from the area - Provide verbal assurance as the individual is regaining consciousness - Provide the patient with privacy if possible - Call for help and start treatment as ordered by the caregiver   After the Seizure (Postictal Stage)  After a seizure, most patients experience confusion, fatigue, muscle pain and/or a headache. Thus, one should permit the individual to sleep. For the next few days, reassurance is essential. Being calm and helping reorient the person is also of importance.  Most seizures are painless and end spontaneously. Seizures are not harmful to others but can lead to complications such as stress on the lungs, brain and the heart. Individuals with prior lung problems may develop labored breathing and respiratory distress.     No orders of the defined types were placed in this encounter.   Meds  ordered this encounter  Medications   Rimegepant Sulfate (NURTEC) 75 MG TBDP    Sig: Take 1 tablet (75 mg total) by mouth every other day.    Dispense:  16 tablet    Refill:  11    Return in about 6 months (around 01/19/2025).    Pastor Falling, MD 07/22/2024, 10:10 PM  [facility] [address], [address] [place], [place] [address] [phone]

## 2024-07-22 NOTE — Telephone Encounter (Signed)
 Requested Prescriptions   Pending Prescriptions Disp Refills   pregabalin  (LYRICA ) 75 MG capsule [Pharmacy Med Name: PREGABALIN  75 MG CAPSULE] 60 capsule     Sig: TAKE 1 CAPSULE BY MOUTH TWICE A DAY   Last seen

## 2024-07-22 NOTE — Patient Instructions (Signed)
 Continue current medications Will prescribe Nurtec again, tells me that they switch insurance hopefully this will be approval since the patient has tried topiramate , pregabalin  and gabapentin  for headaches Continue with pregabalin  for the postherpetic neuralgia, take 2 tablet at night instead of 1 tablet twice daily to limit daytime sleepiness Will complete his disability disability paperwork Follow-up in 6 months or sooner if worse.

## 2024-07-22 NOTE — Telephone Encounter (Signed)
 Last seen 07/22/24 Next appt 02/23/25 Dispenses   Dispensed Days Supply Quantity Provider Pharmacy  PREGABALIN  75 MG CAPSULE 05/17/2024 30 60 each Camara, Amadou, MD [facility] [phone] - [facility]...  PREGABALIN  75 MG CAPSULE 03/12/2024 30 60 each Camara, Amadou, MD [facility] [phone] - G...  PREGABALIN  75 MG CAPSULE 12/25/2023 30 60 each Camara, Amadou, MD [facility] [phone] - [facility]

## 2024-07-30 ENCOUNTER — Telehealth: Payer: Self-pay

## 2024-07-30 ENCOUNTER — Other Ambulatory Visit (HOSPITAL_COMMUNITY): Payer: Self-pay

## 2024-07-30 NOTE — Telephone Encounter (Signed)
 Clinical questions have been answered and PA submitted. PA currently Pending. Please be advised that most companies allow up to 30 days to make a decision. We will advise when a determination has been made, or follow up in 1 week.   Please reach out to our team, Rx Prior Auth Pool, if you haven't heard back in a week.

## 2024-07-30 NOTE — Telephone Encounter (Signed)
 Pharmacy Patient Advocate Encounter   Received notification from CoverMyMeds that prior authorization for Nurtec 75mg  Tablet is required/requested.   Insurance verification completed.   The patient is insured through [facility] MEDICAID.   Per test claim: PA required; PA started via CoverMyMeds. KEY [id number] . Waiting for clinical questions to populate.

## 2024-08-04 ENCOUNTER — Other Ambulatory Visit (HOSPITAL_COMMUNITY): Payer: Self-pay

## 2024-08-04 NOTE — Telephone Encounter (Signed)
 Pharmacy Patient Advocate Encounter  Received notification from [facility] MEDICA[employer] that Prior Authorization for Nurtec has been APPROVED from 07/30/2024 to 07/30/2025. Unable to obtain price due to refill too soon rejection, last fill date 07/31/2024 next available fill date10/27/2025   PA #/Case ID/Reference #: [id number]

## 2024-08-05 ENCOUNTER — Ambulatory Visit: Admitting: Neurology

## 2024-09-16 ENCOUNTER — Telehealth: Payer: Self-pay | Admitting: Adult Health

## 2024-09-16 ENCOUNTER — Telehealth: Payer: Self-pay | Admitting: Neurology

## 2024-09-16 NOTE — Telephone Encounter (Signed)
 Yes ok to schedule with Dr. Buck to discuss inspire

## 2024-09-16 NOTE — Telephone Encounter (Signed)
 Pt stopped in to drop off disability forms and also wanted to leave a message for [place] that he cannot wear his CPAP mask at night because he gets no sleep, he would like information on Aspire. Pt would like a call back to discuss

## 2024-09-16 NOTE — Telephone Encounter (Signed)
 Megan- does not appear pt has used CPAP since 03/09/24. See notes below. Did you want him to schedule appt with Dr. Buck to discuss inspire/other options?   Pt last saw MM,NP 04/13/24. I snipped data for last yr for pt CPAP (see below).    Saw Dr. Gregg 07/22/24 and he documented: He experiences significant difficulty sleeping at night due to discomfort with CPAP therapy. He has tried both nasal and full-face masks but finds them uncomfortable, leading to frequent removal during sleep. He describes tossing and turning, often waking up to find the mask on the floor. He has not used the CPAP machine for some time due to these issues.

## 2024-09-16 NOTE — Telephone Encounter (Signed)
 LVM for patient to call back to schedule an appointment.

## 2024-09-16 NOTE — Telephone Encounter (Signed)
 Pt is needing to schedule an appt with the provider to discuss the option of getting the Inspire. Please advise.

## 2024-09-17 DIAGNOSIS — Z0289 Encounter for other administrative examinations: Secondary | ICD-10-CM

## 2024-09-21 ENCOUNTER — Telehealth: Payer: Self-pay | Admitting: *Deleted

## 2024-09-21 NOTE — Telephone Encounter (Signed)
 Pt wife called need update on husband form. The form on Dr Gregg desk. Please call [phone]

## 2024-09-21 NOTE — Telephone Encounter (Signed)
 Form completed.

## 2024-09-21 NOTE — Telephone Encounter (Signed)
 Pt has already been scheduled.

## 2024-09-22 ENCOUNTER — Telehealth: Payer: Self-pay | Admitting: *Deleted

## 2024-09-22 NOTE — Telephone Encounter (Signed)
 Pt us  able life form faxed on 09/22/2024

## 2024-11-17 ENCOUNTER — Encounter: Payer: Self-pay | Admitting: Neurology

## 2024-11-17 ENCOUNTER — Telehealth: Admitting: Adult Health

## 2024-11-17 ENCOUNTER — Ambulatory Visit: Admitting: Neurology

## 2025-02-23 ENCOUNTER — Ambulatory Visit: Admitting: Neurology
